# Patient Record
Sex: Female | Born: 1980
Health system: Southern US, Community
[De-identification: ages and names within clinical notes are randomized; demographics above are authoritative.]

## PROBLEM LIST (undated history)

## (undated) DIAGNOSIS — F32A Depression, unspecified: Secondary | ICD-10-CM

## (undated) DIAGNOSIS — F329 Major depressive disorder, single episode, unspecified: Secondary | ICD-10-CM

## (undated) DIAGNOSIS — G8929 Other chronic pain: Secondary | ICD-10-CM

## (undated) DIAGNOSIS — M549 Dorsalgia, unspecified: Secondary | ICD-10-CM

## (undated) DIAGNOSIS — M419 Scoliosis, unspecified: Secondary | ICD-10-CM

## (undated) HISTORY — PX: BACK SURGERY: SHX140

## (undated) HISTORY — PX: OTHER SURGICAL HISTORY: SHX169

## (undated) HISTORY — PX: ENDOMETRIAL ABLATION: SHX621

## (undated) HISTORY — DX: Scoliosis, unspecified: M41.9

---
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
## 1998-12-05 ENCOUNTER — Encounter: Payer: Self-pay | Admitting: Family Medicine

## 1998-12-05 ENCOUNTER — Emergency Department (HOSPITAL_COMMUNITY): Admission: EM | Admit: 1998-12-05 | Discharge: 1998-12-05 | Payer: Self-pay | Admitting: Emergency Medicine

## 1999-02-26 ENCOUNTER — Inpatient Hospital Stay (HOSPITAL_COMMUNITY): Admission: EM | Admit: 1999-02-26 | Discharge: 1999-02-28 | Payer: Self-pay | Admitting: *Deleted

## 1999-08-14 ENCOUNTER — Encounter: Admission: RE | Admit: 1999-08-14 | Discharge: 1999-08-14 | Payer: Self-pay | Admitting: Family Medicine

## 1999-08-14 ENCOUNTER — Other Ambulatory Visit: Admission: RE | Admit: 1999-08-14 | Discharge: 1999-08-14 | Payer: Self-pay | Admitting: Family Medicine

## 2000-02-12 ENCOUNTER — Encounter: Payer: Self-pay | Admitting: *Deleted

## 2000-02-12 ENCOUNTER — Inpatient Hospital Stay (HOSPITAL_COMMUNITY): Admission: AD | Admit: 2000-02-12 | Discharge: 2000-02-12 | Payer: Self-pay | Admitting: *Deleted

## 2000-03-07 ENCOUNTER — Other Ambulatory Visit: Admission: RE | Admit: 2000-03-07 | Discharge: 2000-03-07 | Payer: Self-pay | Admitting: Family Medicine

## 2000-03-24 ENCOUNTER — Other Ambulatory Visit: Admission: RE | Admit: 2000-03-24 | Discharge: 2000-03-24 | Payer: Self-pay | Admitting: Gynecology

## 2000-03-24 ENCOUNTER — Other Ambulatory Visit: Admission: RE | Admit: 2000-03-24 | Discharge: 2000-03-24 | Payer: Self-pay | Admitting: Obstetrics and Gynecology

## 2000-06-03 ENCOUNTER — Inpatient Hospital Stay (HOSPITAL_COMMUNITY): Admission: AD | Admit: 2000-06-03 | Discharge: 2000-06-03 | Payer: Self-pay | Admitting: Obstetrics & Gynecology

## 2000-06-23 ENCOUNTER — Emergency Department (HOSPITAL_COMMUNITY): Admission: EM | Admit: 2000-06-23 | Discharge: 2000-06-23 | Payer: Self-pay | Admitting: Emergency Medicine

## 2000-07-02 ENCOUNTER — Emergency Department (HOSPITAL_COMMUNITY): Admission: EM | Admit: 2000-07-02 | Discharge: 2000-07-03 | Payer: Self-pay | Admitting: *Deleted

## 2000-07-17 ENCOUNTER — Inpatient Hospital Stay (HOSPITAL_COMMUNITY): Admission: AD | Admit: 2000-07-17 | Discharge: 2000-07-17 | Payer: Self-pay | Admitting: Obstetrics and Gynecology

## 2000-08-13 ENCOUNTER — Inpatient Hospital Stay (HOSPITAL_COMMUNITY): Admission: AD | Admit: 2000-08-13 | Discharge: 2000-08-17 | Payer: Self-pay | Admitting: Obstetrics and Gynecology

## 2001-03-12 ENCOUNTER — Inpatient Hospital Stay (HOSPITAL_COMMUNITY): Admission: AD | Admit: 2001-03-12 | Discharge: 2001-03-12 | Payer: Self-pay | Admitting: Obstetrics and Gynecology

## 2001-04-14 ENCOUNTER — Encounter: Payer: Self-pay | Admitting: *Deleted

## 2001-04-14 ENCOUNTER — Inpatient Hospital Stay (HOSPITAL_COMMUNITY): Admission: AD | Admit: 2001-04-14 | Discharge: 2001-04-14 | Payer: Self-pay | Admitting: *Deleted

## 2001-04-23 ENCOUNTER — Inpatient Hospital Stay (HOSPITAL_COMMUNITY): Admission: AD | Admit: 2001-04-23 | Discharge: 2001-04-23 | Payer: Self-pay | Admitting: *Deleted

## 2001-12-06 ENCOUNTER — Emergency Department (HOSPITAL_COMMUNITY): Admission: EM | Admit: 2001-12-06 | Discharge: 2001-12-07 | Payer: Self-pay | Admitting: Emergency Medicine

## 2001-12-07 ENCOUNTER — Encounter: Payer: Self-pay | Admitting: Emergency Medicine

## 2002-08-24 ENCOUNTER — Other Ambulatory Visit: Admission: RE | Admit: 2002-08-24 | Discharge: 2002-08-24 | Payer: Self-pay | Admitting: *Deleted

## 2002-10-19 ENCOUNTER — Emergency Department (HOSPITAL_COMMUNITY): Admission: EM | Admit: 2002-10-19 | Discharge: 2002-10-19 | Payer: Self-pay | Admitting: Emergency Medicine

## 2002-10-19 ENCOUNTER — Encounter: Payer: Self-pay | Admitting: Emergency Medicine

## 2003-05-10 ENCOUNTER — Inpatient Hospital Stay (HOSPITAL_COMMUNITY): Admission: AD | Admit: 2003-05-10 | Discharge: 2003-05-10 | Payer: Self-pay | Admitting: *Deleted

## 2003-05-10 IMAGING — US US OB COMP LESS 14 WK
1 series · 14 of 20 positions shown · non-contrast
Comparison: none

CLINICAL DATA: pregnant; bleeding
 EARLY OBSTETRICAL ULTRASOUND

[Series 1: unknown · 0.32mm/px · 14 of 20 slices shown]
[im 1/20]
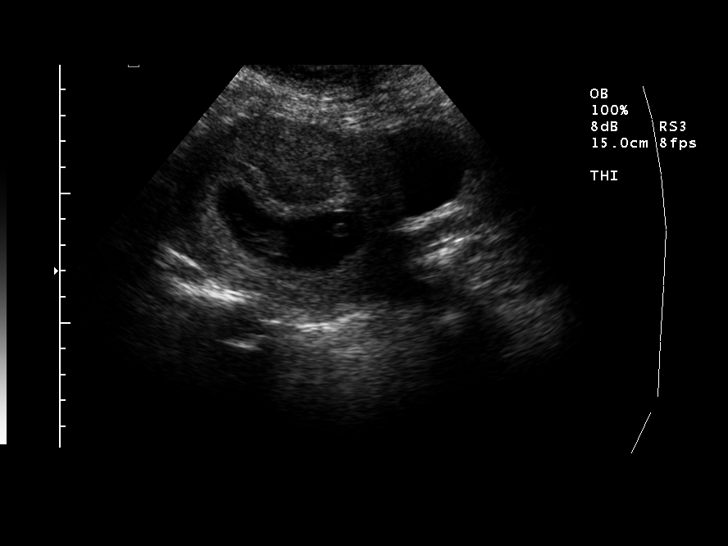
[im 3/20]
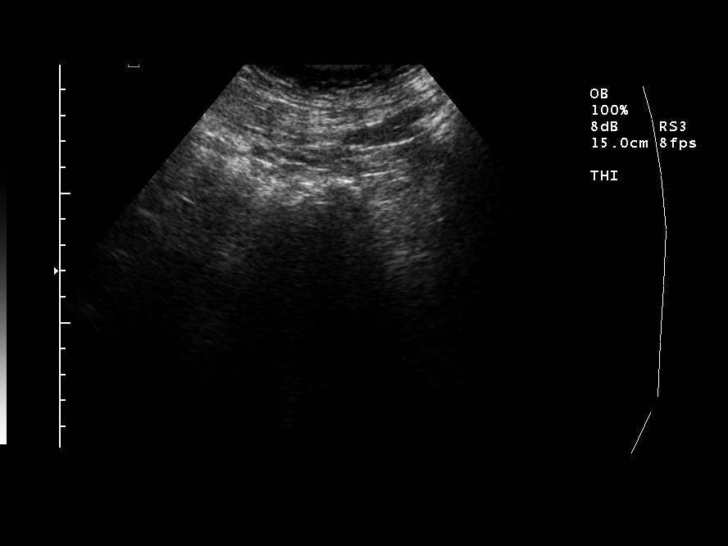
[im 4/20]
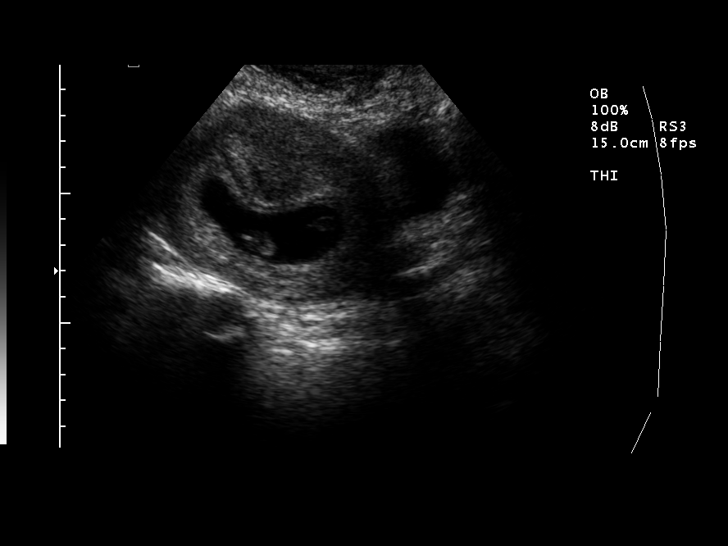
[im 6/20]
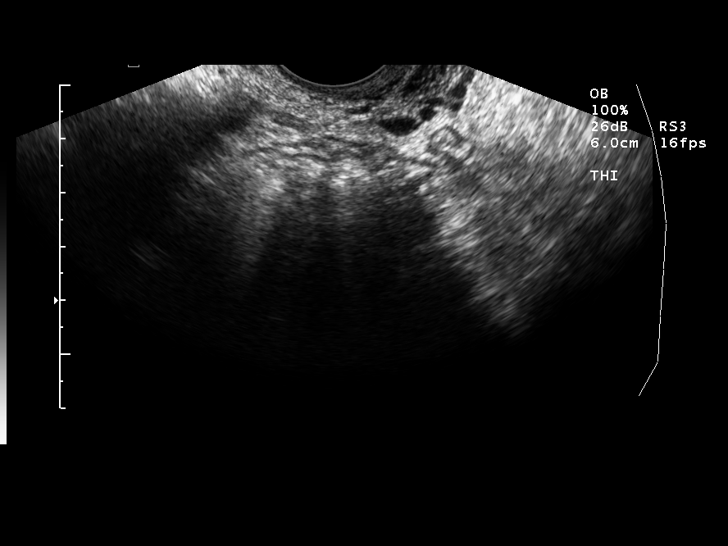
[im 7/20]
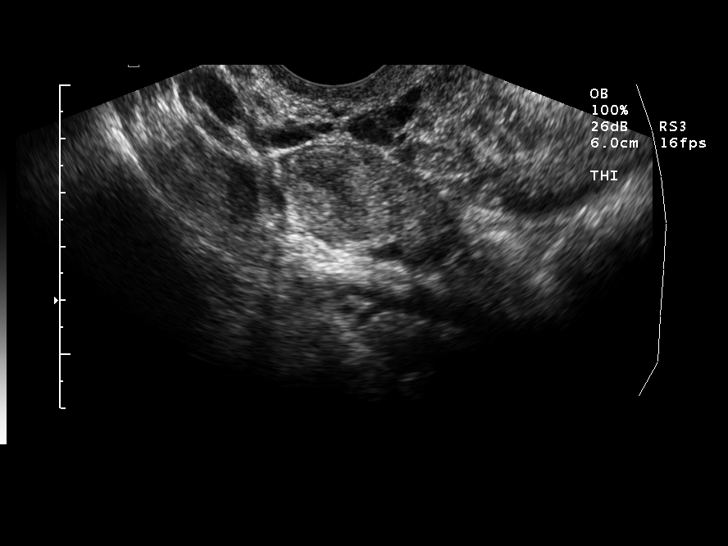
[im 8/20]
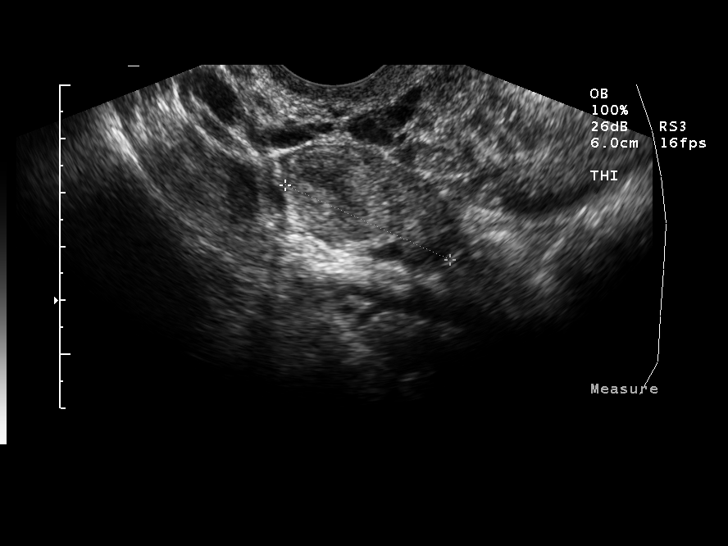
[im 10/20]
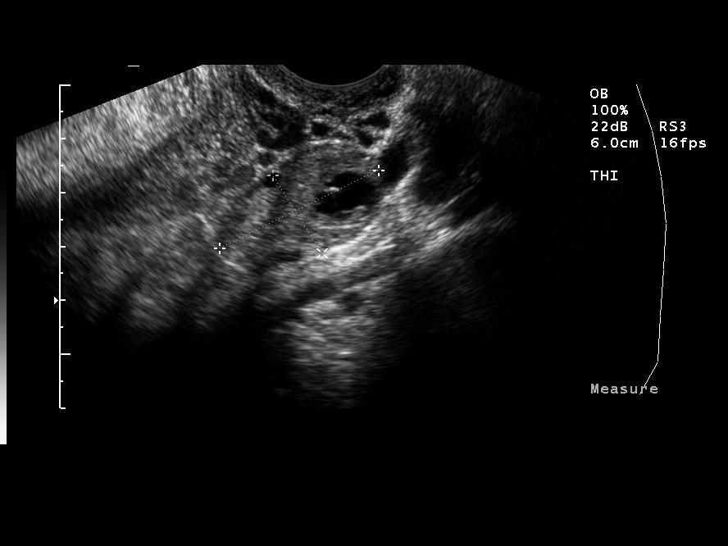
[im 11/20]
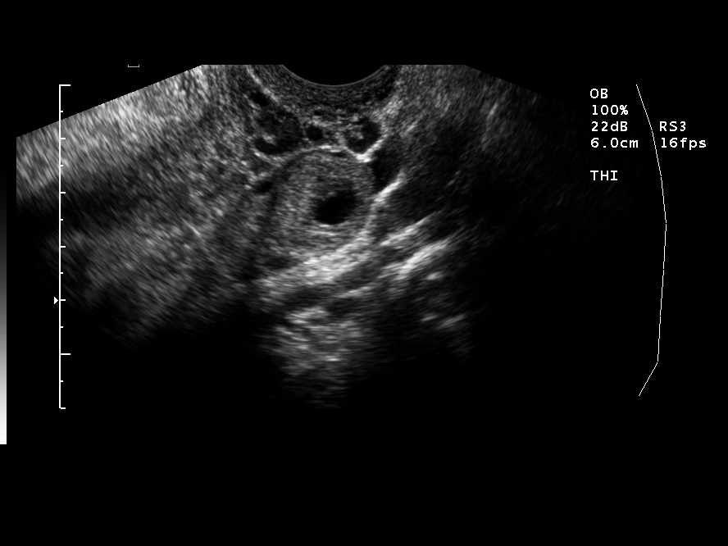
[im 13/20]
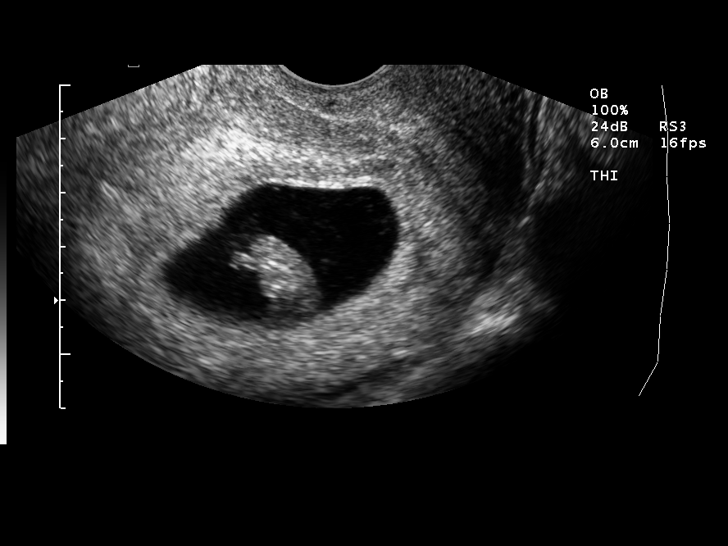
[im 14/20]
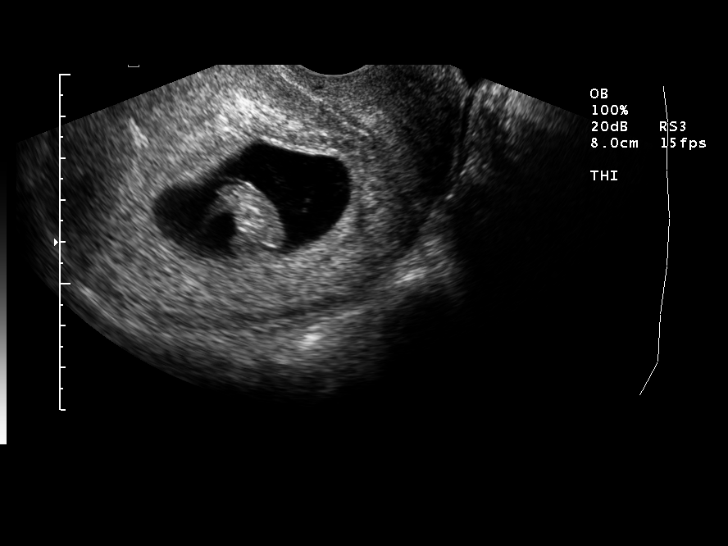
[im 16/20]
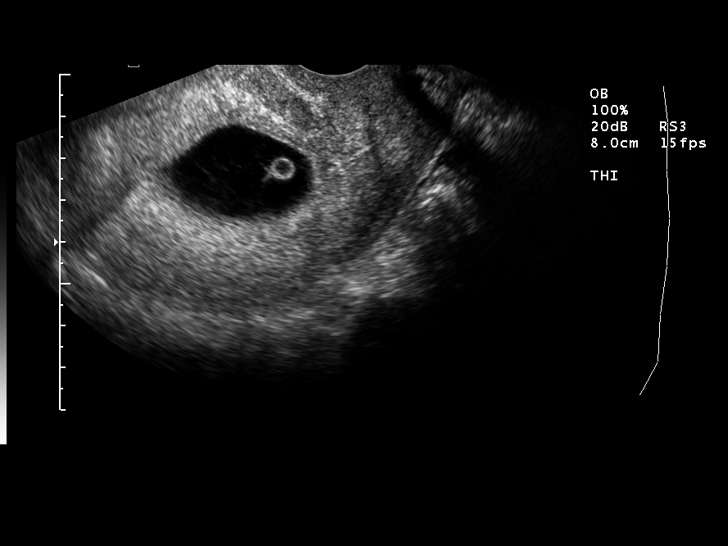
[im 17/20]
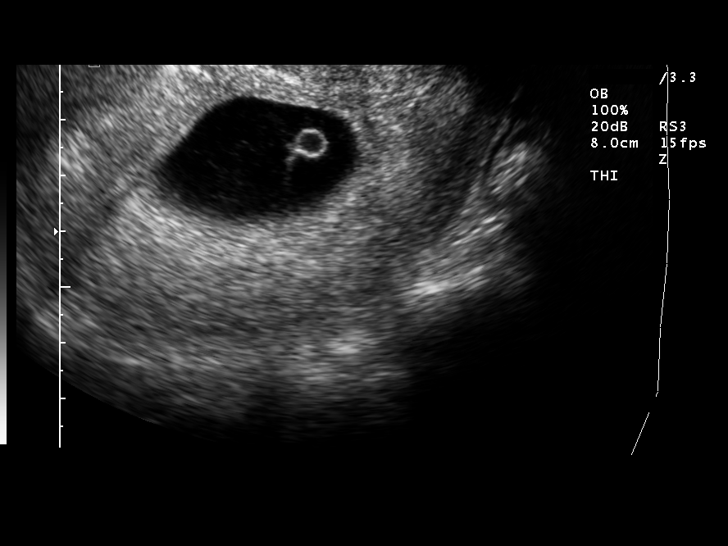
[im 18/20]
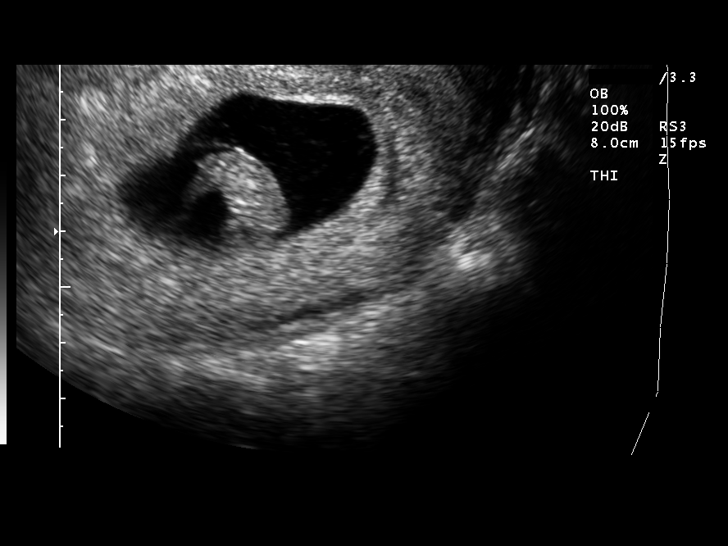
[im 20/20]
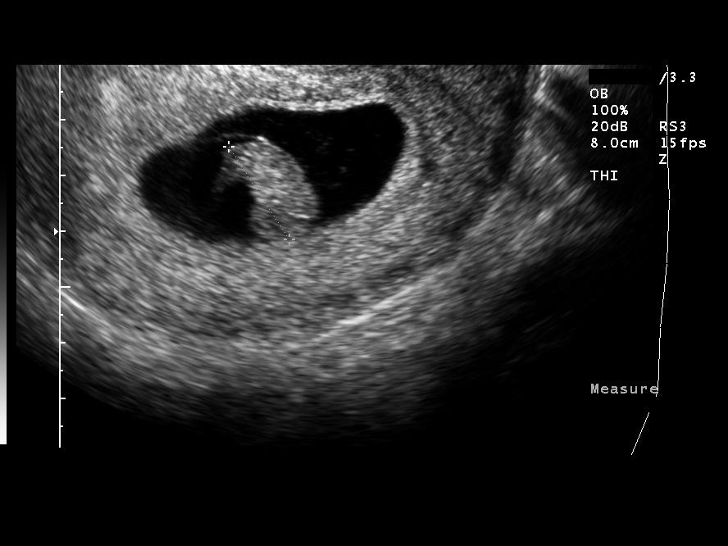

[14 of 20 positions shown; findings below may reference images not displayed]

FINDINGS: Single live intrauterine gestation with a crown rump length of 2cm (8 weeks, 4 days).  Heart rate evident at 183 beats per minute.   Amniotic fluid appears subjectively normal.  Right ovary is unremarkable.  Left ovary is not visualized.  No free fluid is seen.  
 IMPRESSION
 Single live intrauterine gestation 8 weeks, 4 days without apparent complication.

## 2003-05-18 ENCOUNTER — Other Ambulatory Visit: Admission: RE | Admit: 2003-05-18 | Discharge: 2003-05-18 | Payer: Self-pay | Admitting: Obstetrics and Gynecology

## 2003-09-26 ENCOUNTER — Inpatient Hospital Stay (HOSPITAL_COMMUNITY): Admission: AD | Admit: 2003-09-26 | Discharge: 2003-09-26 | Payer: Self-pay | Admitting: Obstetrics & Gynecology

## 2003-11-18 ENCOUNTER — Inpatient Hospital Stay (HOSPITAL_COMMUNITY): Admission: AD | Admit: 2003-11-18 | Discharge: 2003-11-19 | Payer: Self-pay | Admitting: Obstetrics and Gynecology

## 2003-11-20 ENCOUNTER — Inpatient Hospital Stay (HOSPITAL_COMMUNITY): Admission: AD | Admit: 2003-11-20 | Discharge: 2003-11-20 | Payer: Self-pay | Admitting: Obstetrics and Gynecology

## 2003-12-06 ENCOUNTER — Inpatient Hospital Stay (HOSPITAL_COMMUNITY): Admission: RE | Admit: 2003-12-06 | Discharge: 2003-12-10 | Payer: Self-pay | Admitting: Obstetrics and Gynecology

## 2003-12-06 ENCOUNTER — Encounter (INDEPENDENT_AMBULATORY_CARE_PROVIDER_SITE_OTHER): Payer: Self-pay | Admitting: Specialist

## 2004-07-19 ENCOUNTER — Other Ambulatory Visit: Admission: RE | Admit: 2004-07-19 | Discharge: 2004-07-19 | Payer: Self-pay | Admitting: Obstetrics and Gynecology

## 2004-08-29 ENCOUNTER — Emergency Department (HOSPITAL_COMMUNITY): Admission: EM | Admit: 2004-08-29 | Discharge: 2004-08-29 | Payer: Self-pay | Admitting: Emergency Medicine

## 2004-08-29 IMAGING — CR DG CERVICAL SPINE COMPLETE 4+V
6 series · 6 of 6 positions shown · non-contrast
Comparison: none

CLINICAL DATA: Neck pain for two days.
 CERVICAL SPINE SERIES ? 6 VIEWS:
 Loss of the normal lordotic curvature with mild kyphosis.  Negative for fracture, diastasis, or subluxation.

[w c-spine lat]
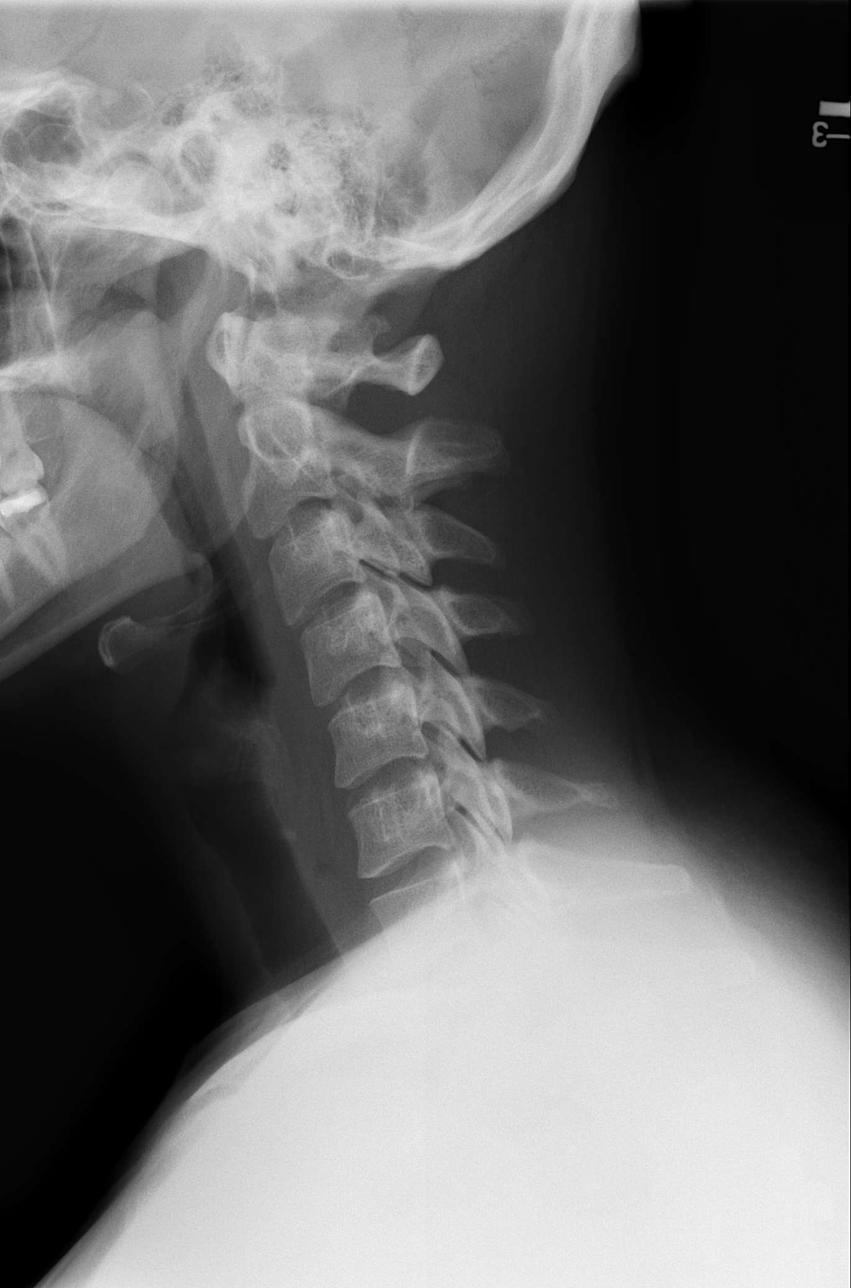

[w c-spine oblique (1 of 2)]
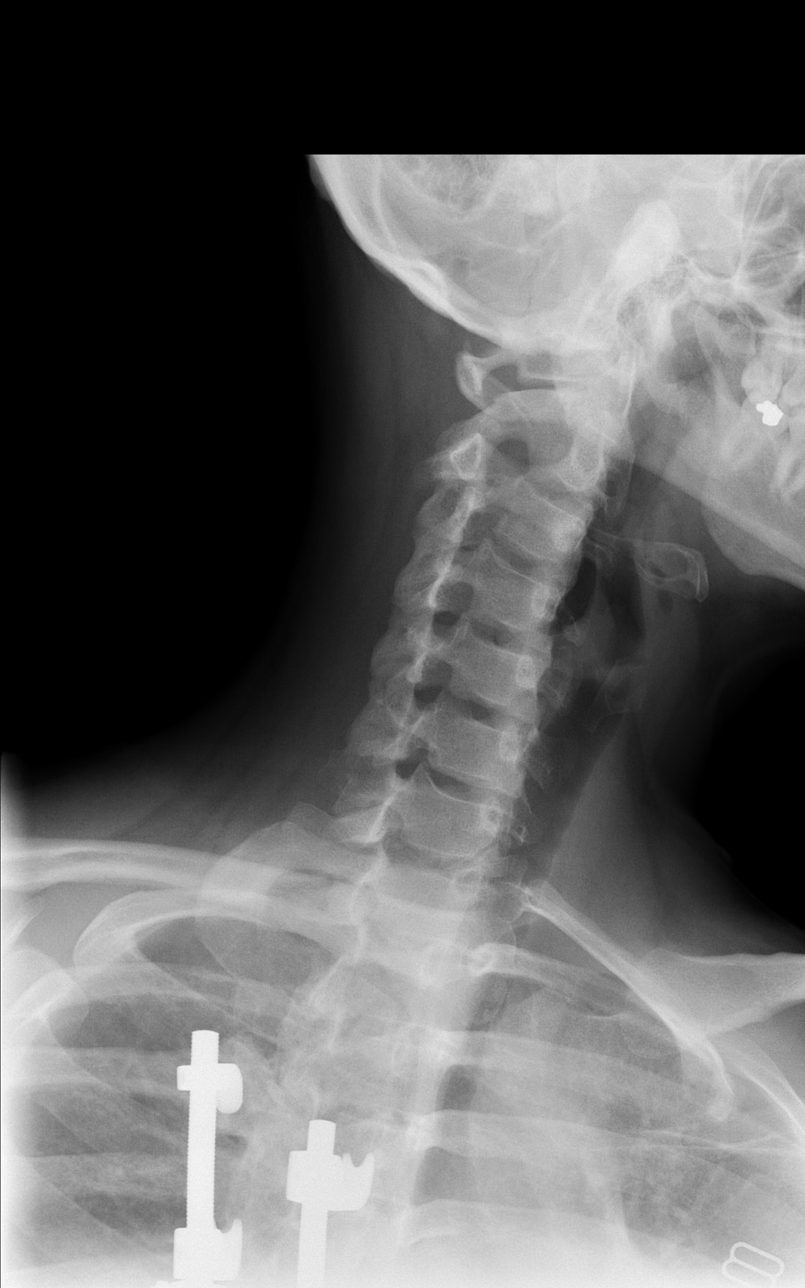

[w c-spine oblique (2 of 2)]
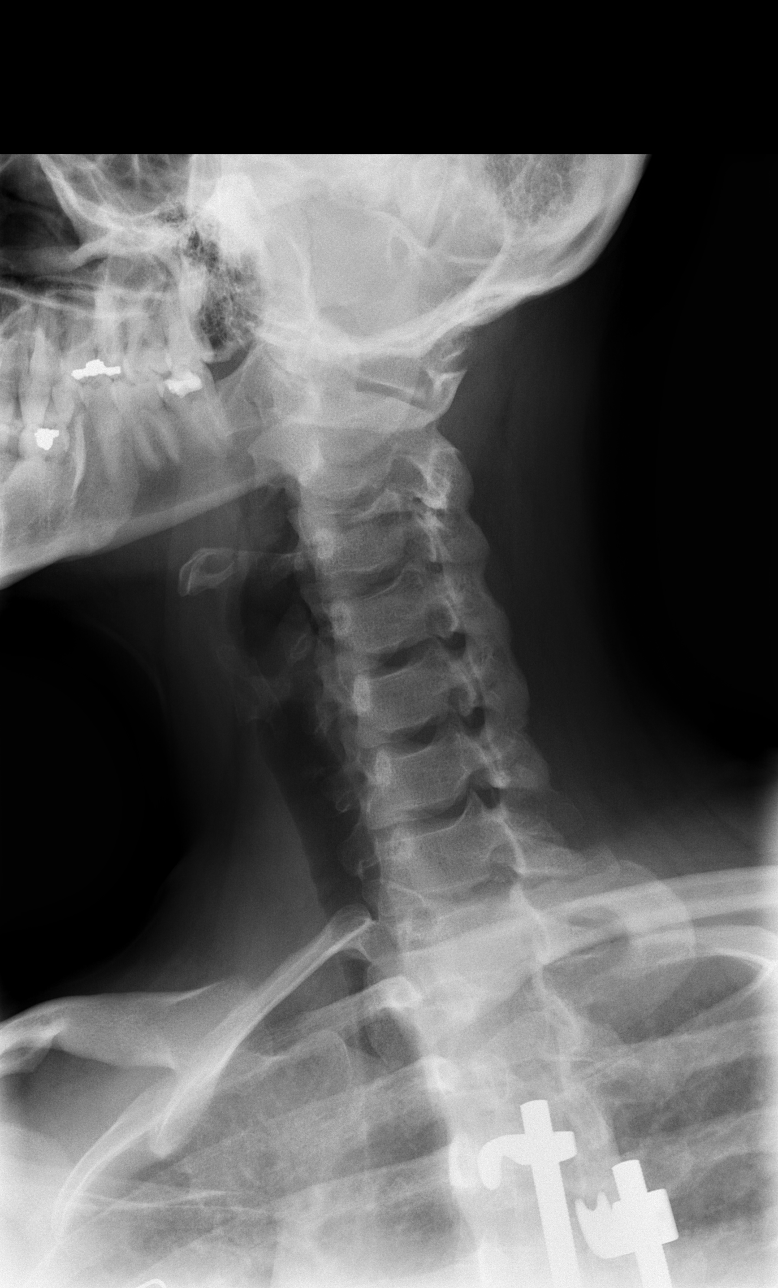

[w c-spine a.p. *]
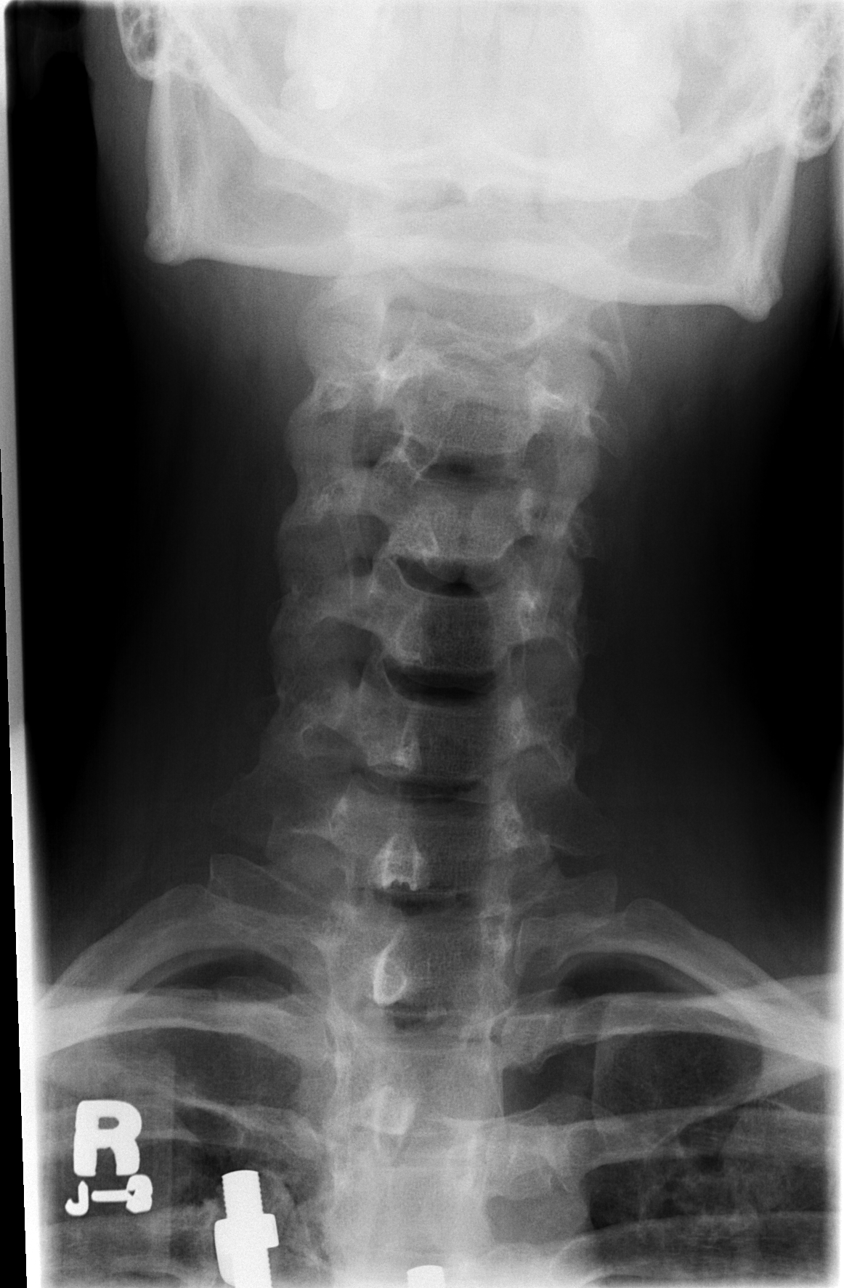

[w c-spine odontoid * (1 of 2)]
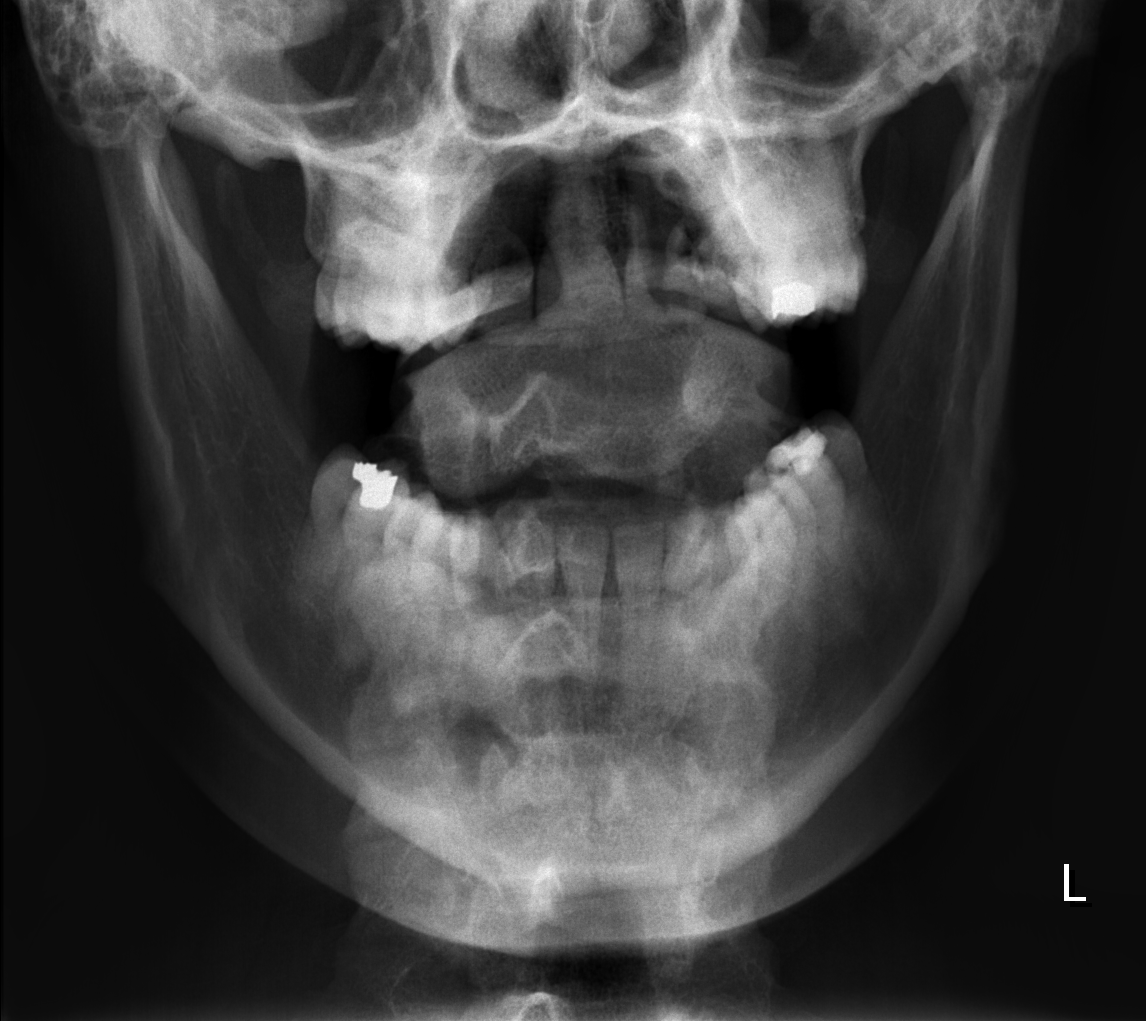

[w c-spine odontoid * (2 of 2)]
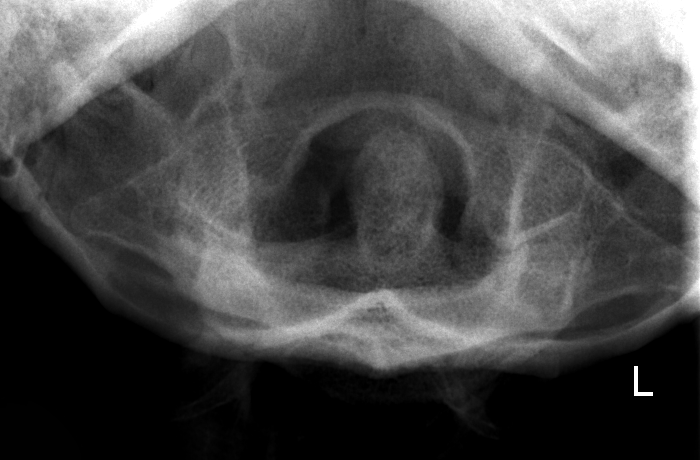

[6 of 6 positions shown; findings below may reference images not displayed]

IMPRESSION: No acute cervical spine fracture or subluxation.

## 2005-06-25 ENCOUNTER — Ambulatory Visit: Payer: Self-pay | Admitting: Family Medicine

## 2006-01-02 ENCOUNTER — Inpatient Hospital Stay (HOSPITAL_COMMUNITY): Admission: AD | Admit: 2006-01-02 | Discharge: 2006-01-02 | Payer: Self-pay | Admitting: Obstetrics and Gynecology

## 2006-07-19 ENCOUNTER — Emergency Department (HOSPITAL_COMMUNITY): Admission: EM | Admit: 2006-07-19 | Discharge: 2006-07-19 | Payer: Self-pay | Admitting: Emergency Medicine

## 2006-07-19 IMAGING — CR DG HAND COMPLETE 3+V*R*
3 series · 3 of 3 positions shown · non-contrast
Comparison: none

CLINICAL DATA: Hand pain

RIGHT HAND - 3  VIEW:

[x hand pa right]
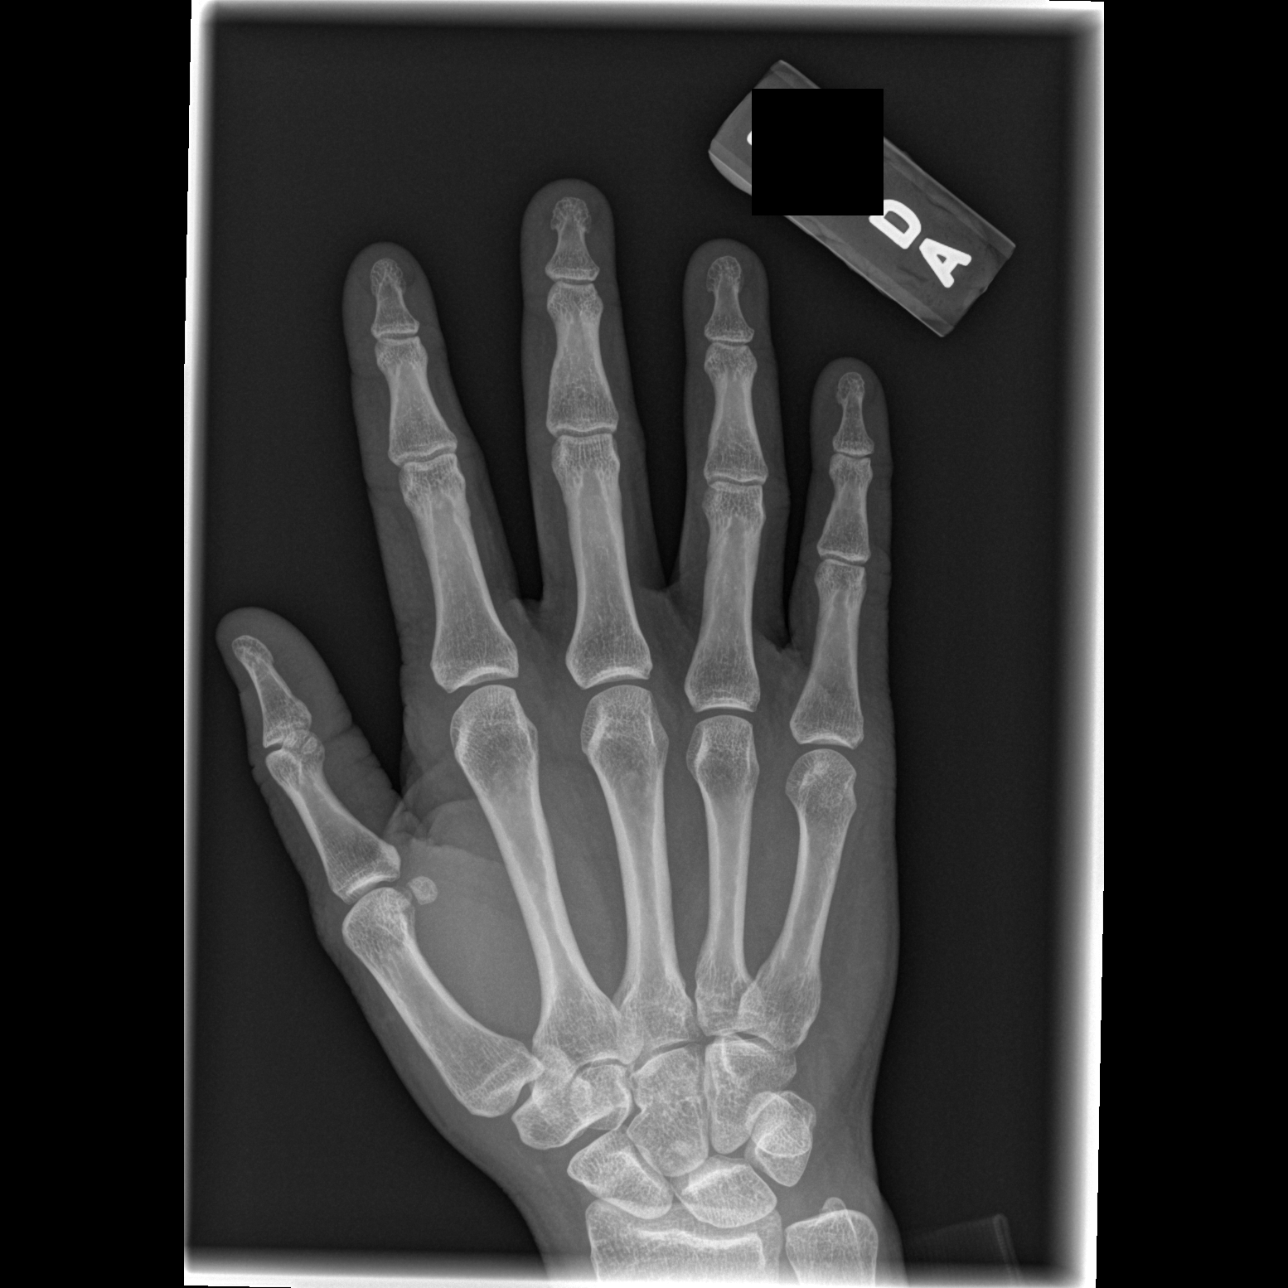

[x hand oblique right]
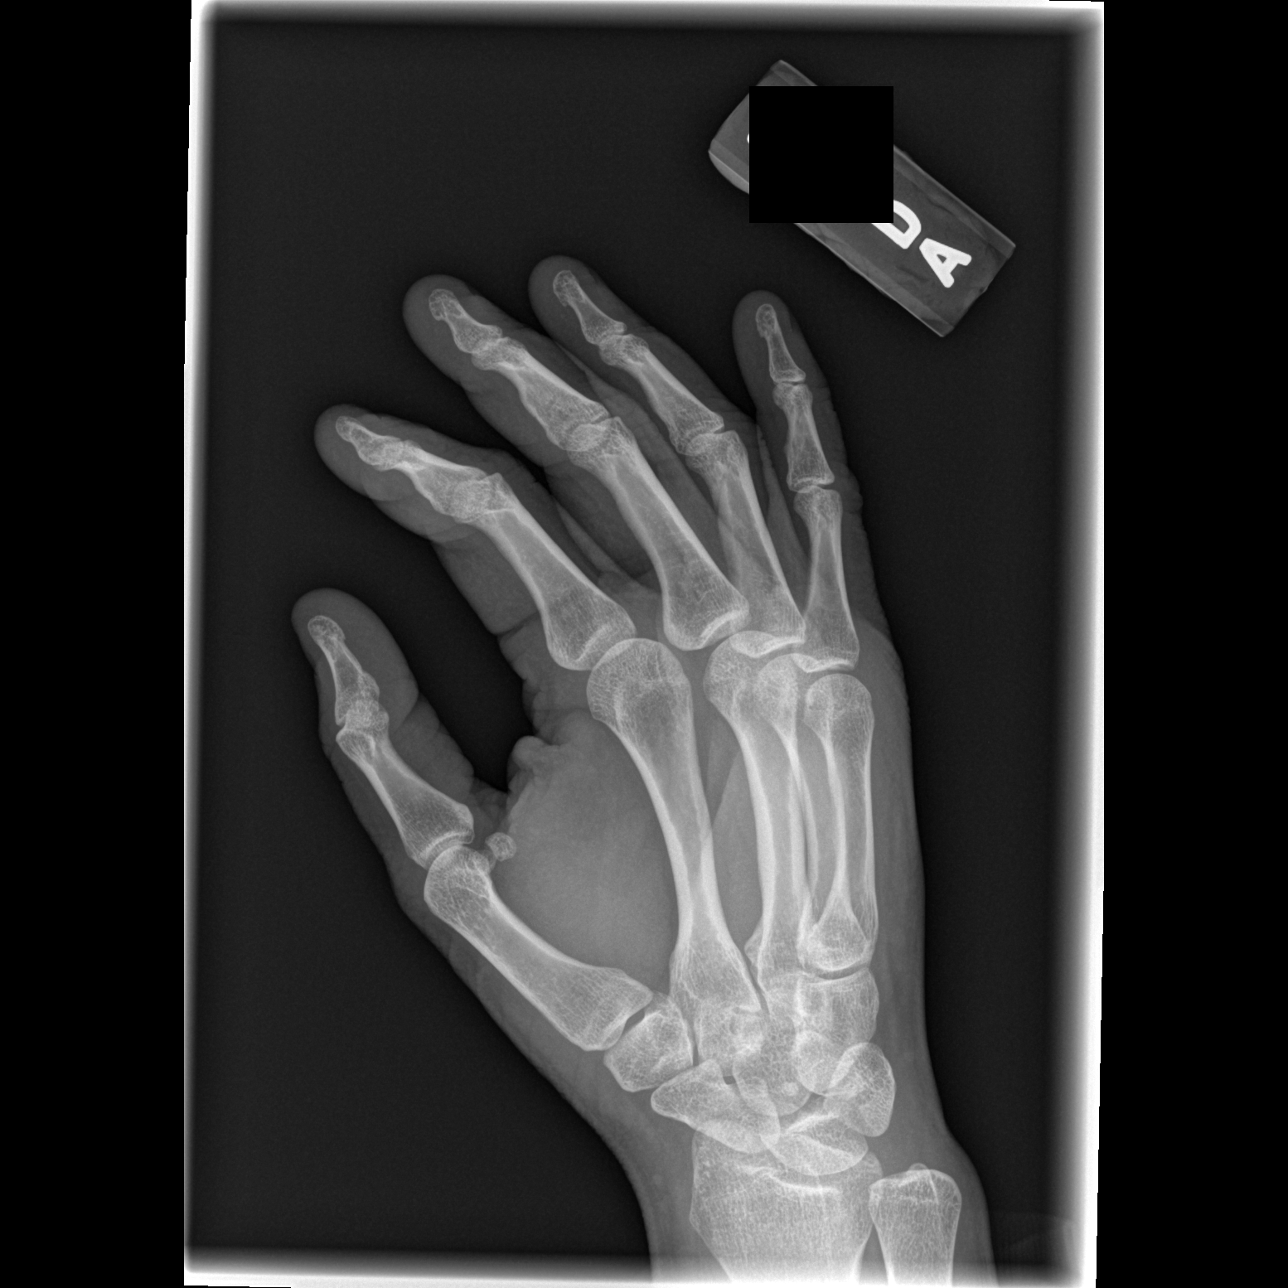

[x hand lat right]
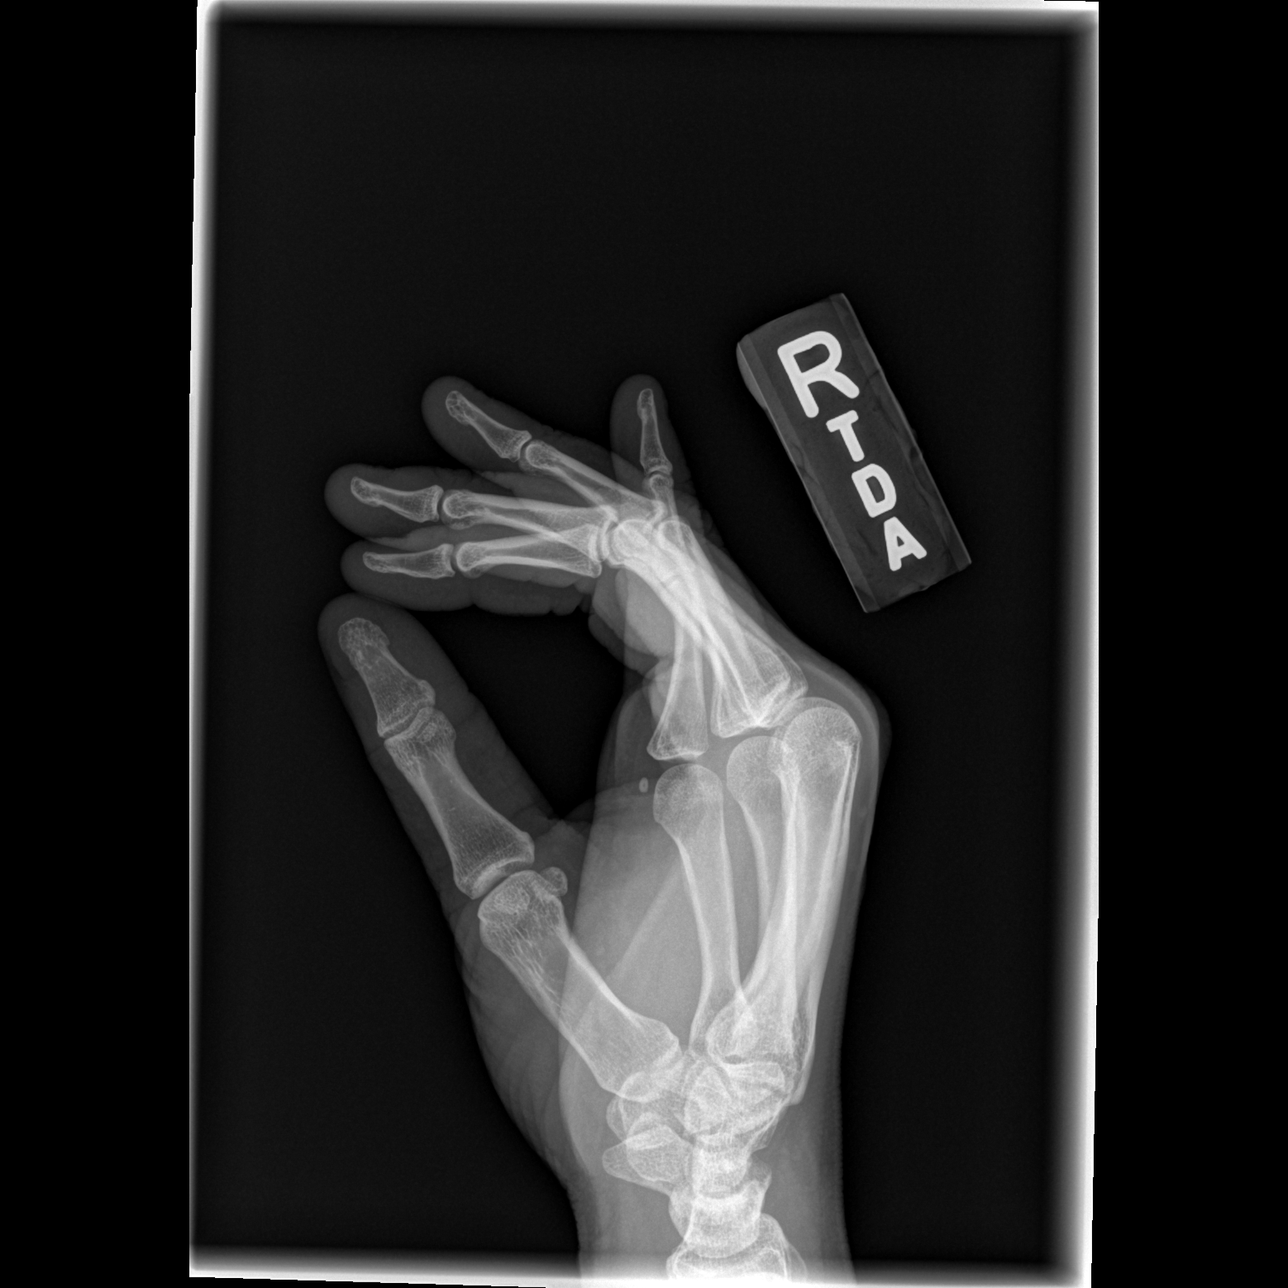

[3 of 3 positions shown; findings below may reference images not displayed]

FINDINGS: There is no evidence of fracture or dislocation.  There is no
evidence of arthropathy or other focal bone abnormality.  Soft tissues are
unremarkable.
IMPRESSION: Negative.

## 2006-12-12 ENCOUNTER — Emergency Department (HOSPITAL_COMMUNITY): Admission: EM | Admit: 2006-12-12 | Discharge: 2006-12-12 | Payer: Self-pay | Admitting: Emergency Medicine

## 2007-04-17 ENCOUNTER — Emergency Department (HOSPITAL_COMMUNITY): Admission: EM | Admit: 2007-04-17 | Discharge: 2007-04-17 | Payer: Self-pay | Admitting: Emergency Medicine

## 2007-04-17 IMAGING — CT CT ABDOMEN W/O CM
2 of 4 series · 17 of 46 positions shown, 19 images · IV contrast (agent unspecified)
Comparison: None available.

CLINICAL DATA: Left flank pain, dark spotting.  
 ABDOMEN CT WITHOUT CONTRAST:
TECHNIQUE: Multidetector CT imaging of the abdomen was performed following the standard protocol without IV contrast.
TECHNIQUE: Multidetector CT imaging of the pelvis was performed following the standard protocol without IV contrast.

[Series 2: stone <(id) w/o a & p (id) · axial · non-contrast · 0.70mm/px · z∈[-439,-69]mm · 14 of 82 slices shown, 16 images]
[im 4/82  soft-tissue]
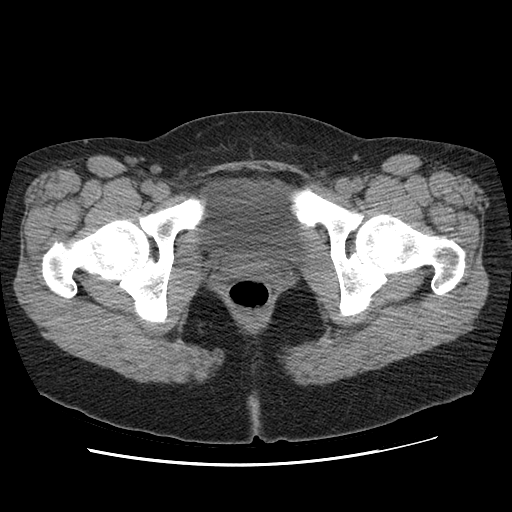
[im 4/82  bone]
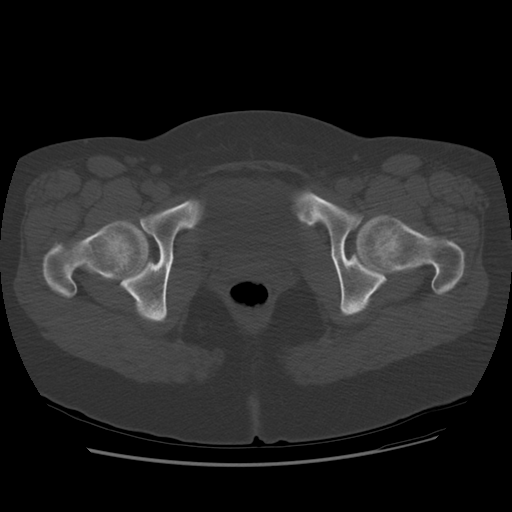
[im 10/82  soft-tissue]
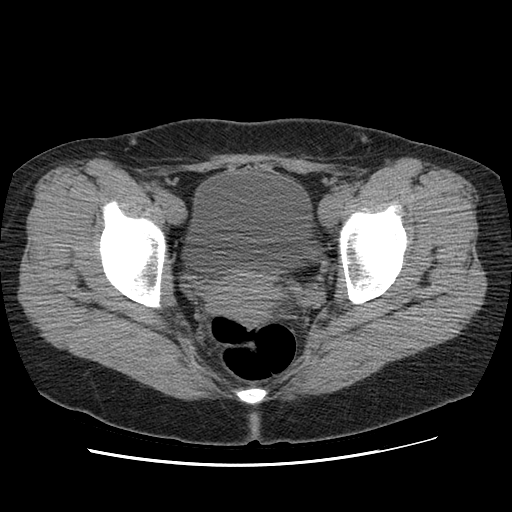
[im 17/82  soft-tissue]
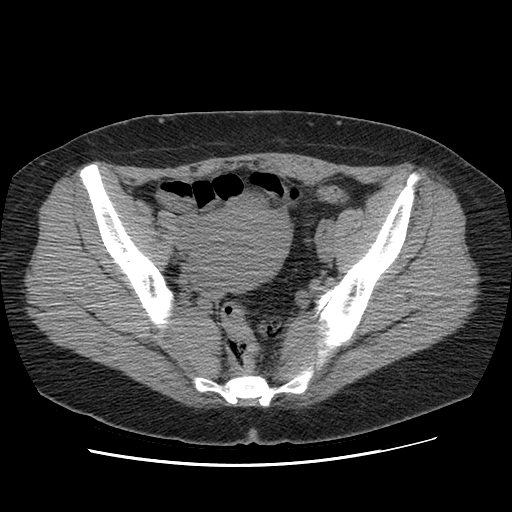
[im 23/82  soft-tissue]
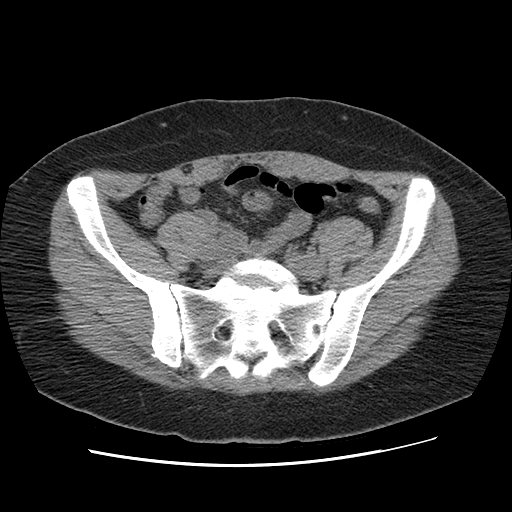
[im 26/82  soft-tissue]
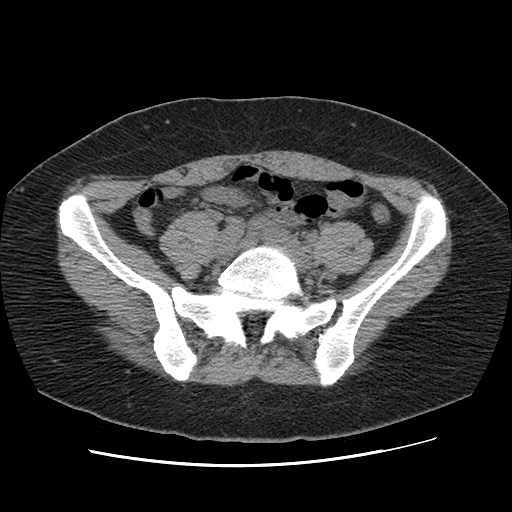
[im 33/82  soft-tissue]
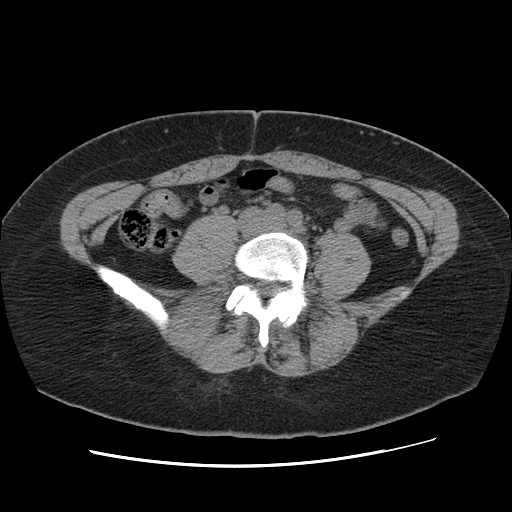
[im 39/82  soft-tissue]
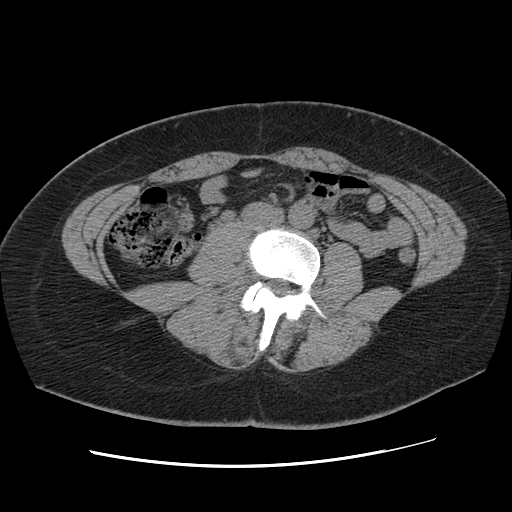
[im 43/82  soft-tissue]
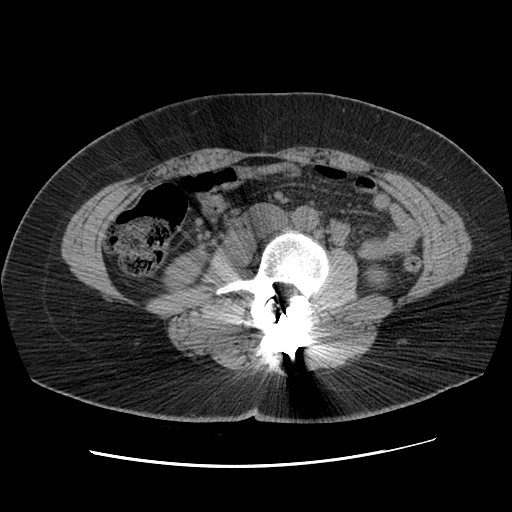
[im 49/82  soft-tissue]
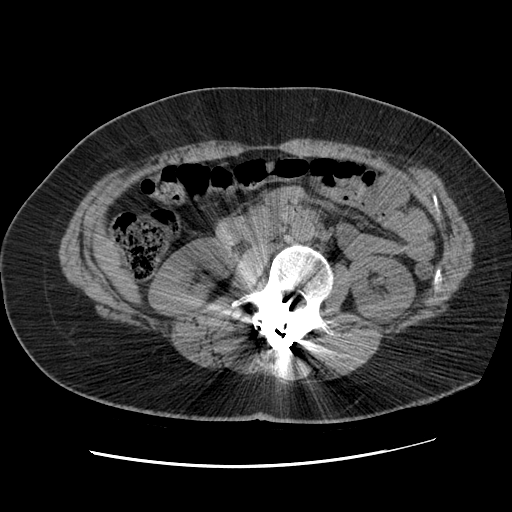
[im 49/82  bone]
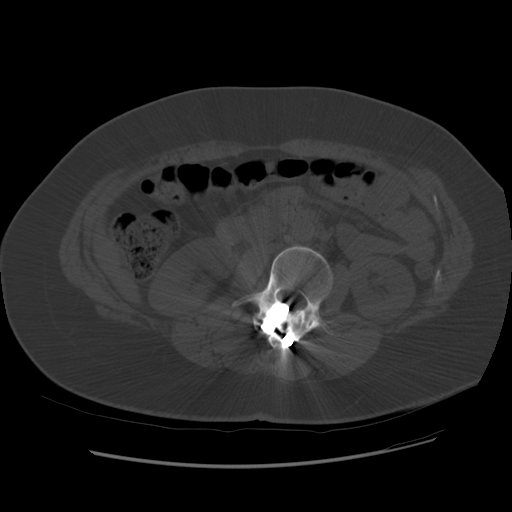
[im 56/82  soft-tissue]
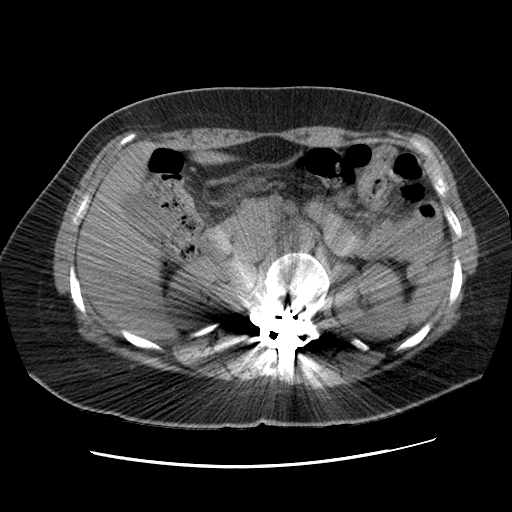
[im 62/82  soft-tissue]
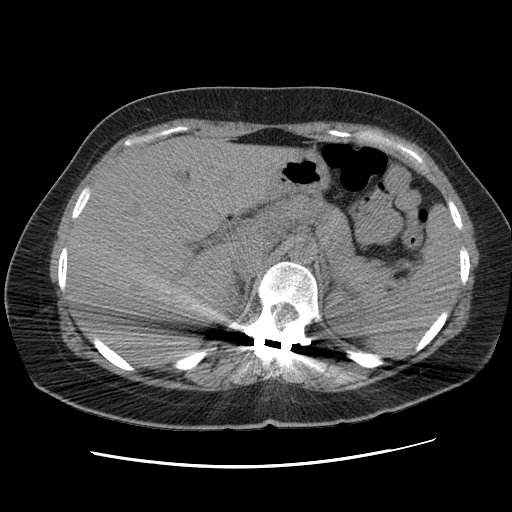
[im 65/82  soft-tissue]
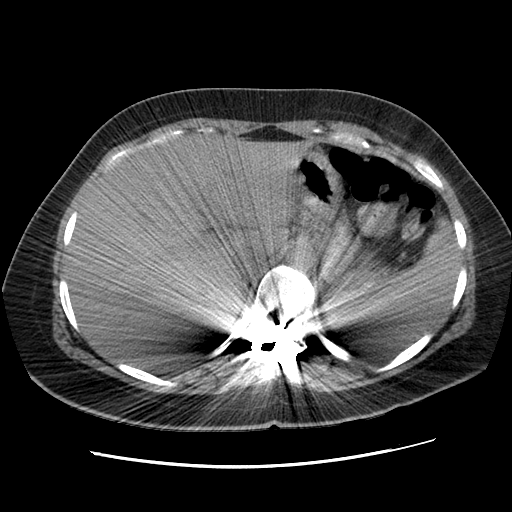
[im 72/82  soft-tissue]
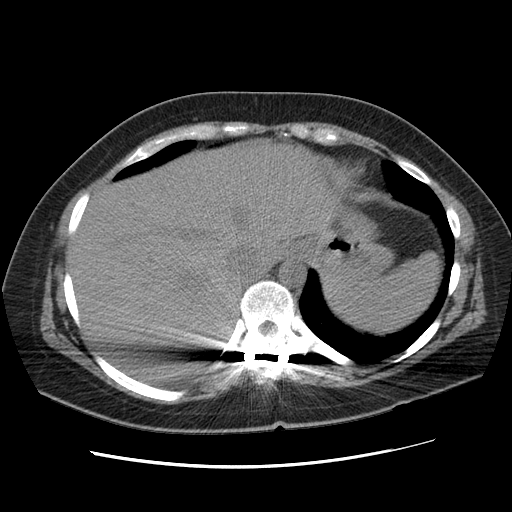
[im 78/82  soft-tissue]
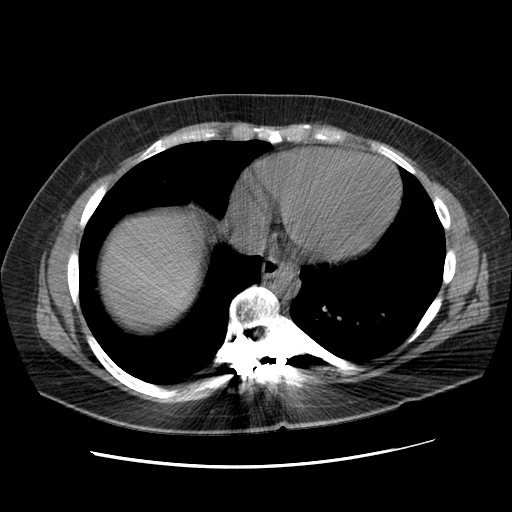

[Series 401: cor a/p · coronal · 0.90mm/px · 3 of 112 slices shown]
[im 38/112  soft-tissue]
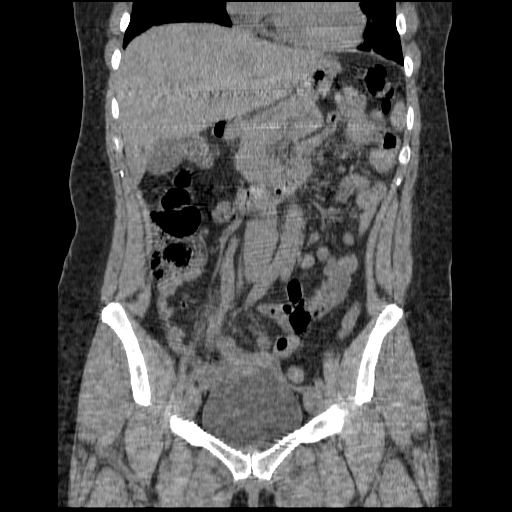
[im 50/112  soft-tissue]
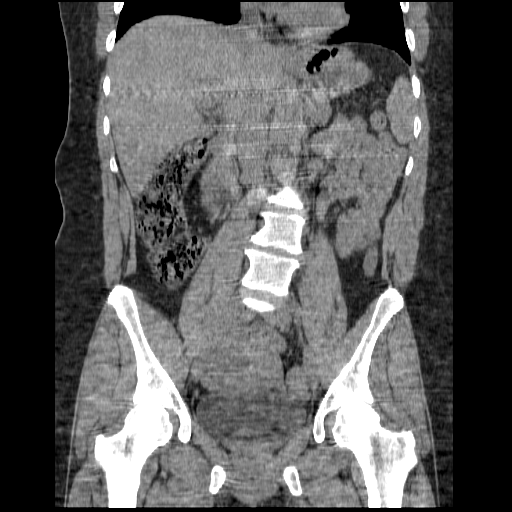
[im 62/112  soft-tissue]
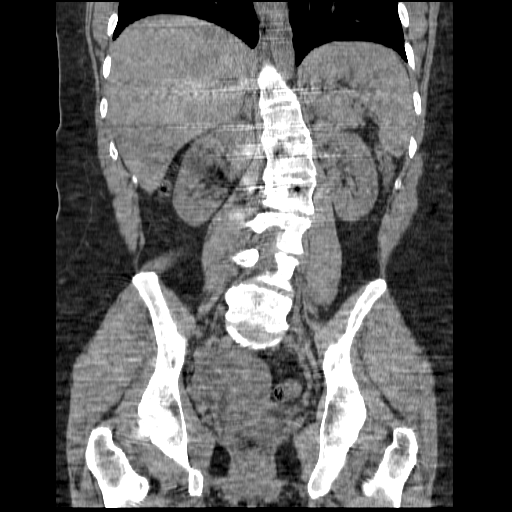

[17 of 46 positions shown; findings below may reference images not displayed]

FINDINGS: Minimal dependent atelectasis in the lung bases.  Harrington rods in the thoracolumbar spine.  No hardware disconnection or complication identified.  Spinal rods do project artifact over the upper abdomen, mildly limiting the examination.  
 The noncontrast infused appearance of the liver and spleen as well adrenals and kidneys is within normal limits.  Stomach and proximal small bowel normal.  Abdominal vasculature unremarkable.  Ureters appear within normal limits extending to the urinary bladder.  Gallbladder unremarkable.  Pancreas unremarkable.  No free air.
IMPRESSION: 1.  Mildly limited exam of the exam due to artifact from Harrington rods.  
 2.  No acute abdominal abnormalities.  
 PELVIS CT WITHOUT CONTRAST:
FINDINGS: The urinary bladder, uterus, and adnexa appear within normal limits.  Tiny amount of free fluid is present in the anatomic pelvis.  Normal appendix identified.  Colon appears within normal limits extending to the rectum.  Bone windows show no aggressive osteolytic or osteoblastic lesions.  Persistent thoracolumbar scoliosis is present which is levo convex at the thoracolumbar junction.
IMPRESSION: No acute pelvic abnormalities.

## 2007-04-19 ENCOUNTER — Emergency Department (HOSPITAL_COMMUNITY): Admission: EM | Admit: 2007-04-19 | Discharge: 2007-04-20 | Payer: Self-pay | Admitting: Emergency Medicine

## 2007-04-25 ENCOUNTER — Emergency Department (HOSPITAL_COMMUNITY): Admission: EM | Admit: 2007-04-25 | Discharge: 2007-04-25 | Payer: Self-pay | Admitting: Emergency Medicine

## 2007-04-25 ENCOUNTER — Encounter: Payer: Self-pay | Admitting: Obstetrics and Gynecology

## 2007-04-25 IMAGING — US US PELVIS COMPLETE MODIFY
1 series · 14 of 25 positions shown · non-contrast
Comparison: none

CLINICAL DATA: Back pain.
 TRANSABDOMINAL AND TRANSVAGINAL PELVIC ULTRASOUND:
TECHNIQUE: Both transabdominal and transvaginal ultrasound examinations of the pelvis were performed including evaluation of the uterus, ovaries, adnexal regions, and pelvic cul-de-sac.

[Series 1: us pelvis complete modify · 0.24mm/px · 14 of 41 slices shown]
[im 1/41]
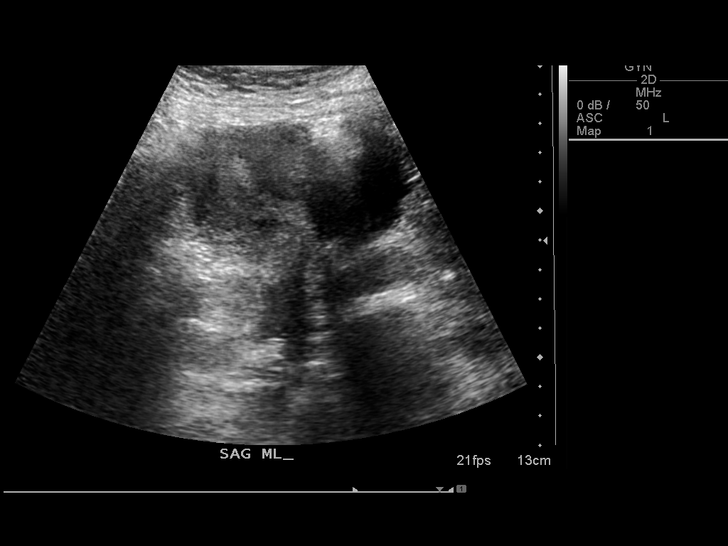
[im 4/41]
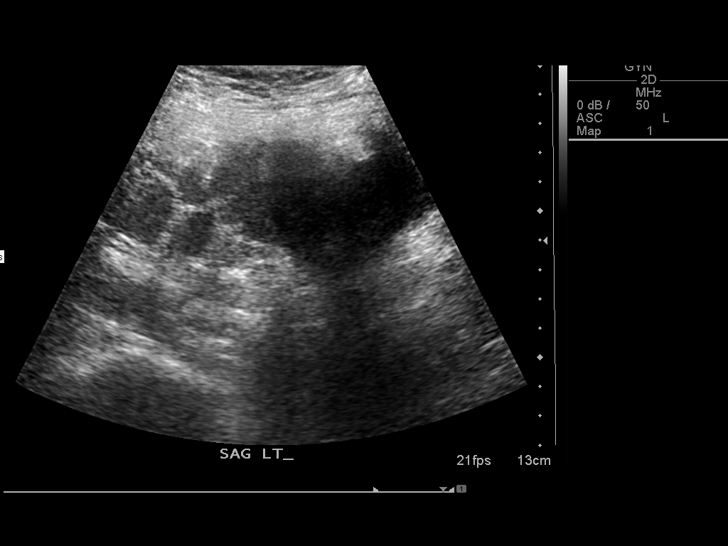
[im 7/41]
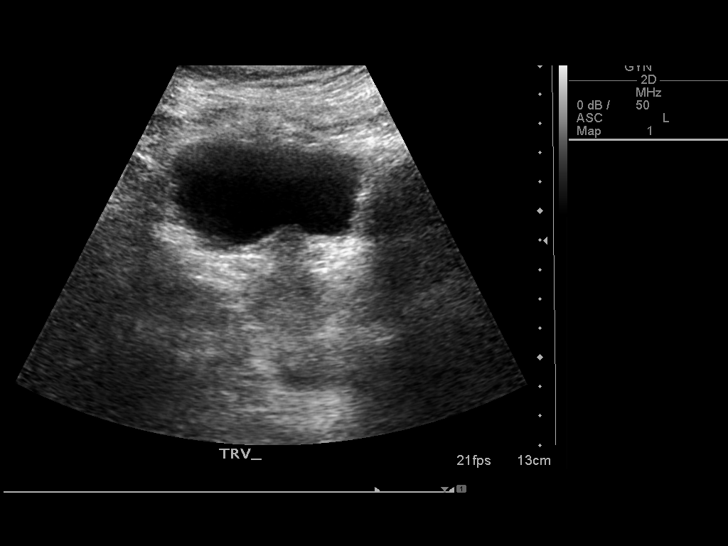
[im 11/41]
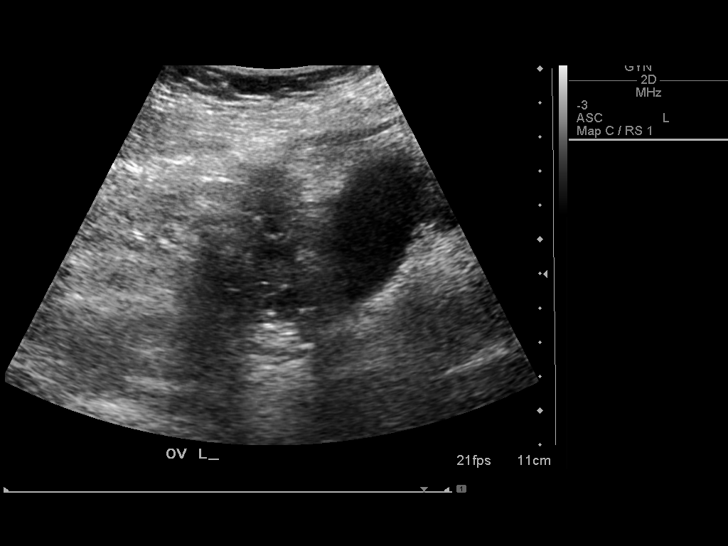
[im 14/41]
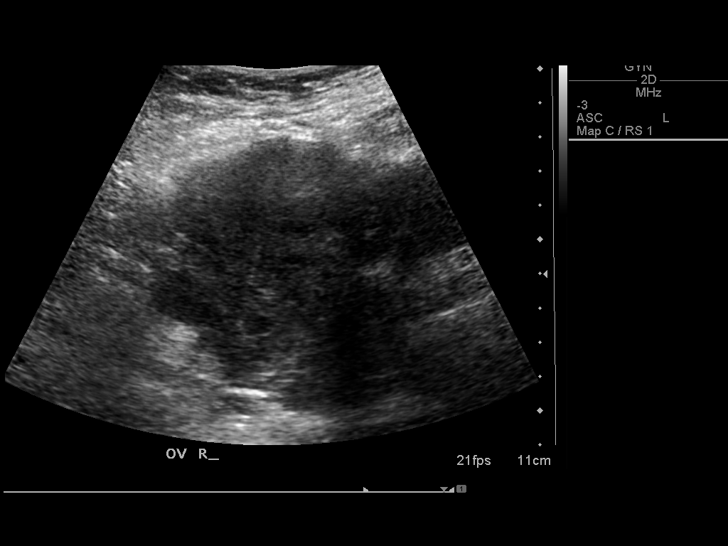
[im 16/41]
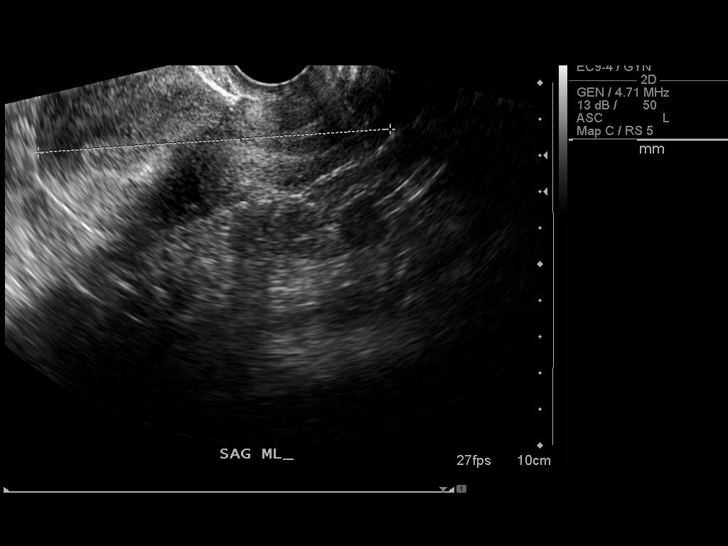
[im 19/41]
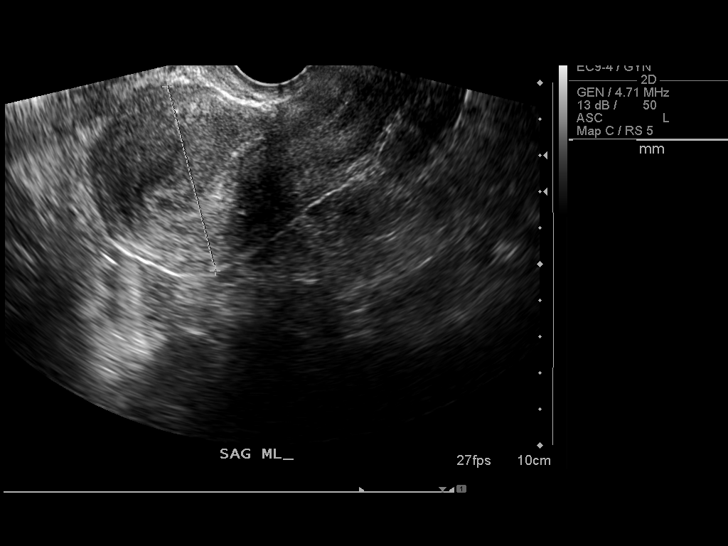
[im 22/41]
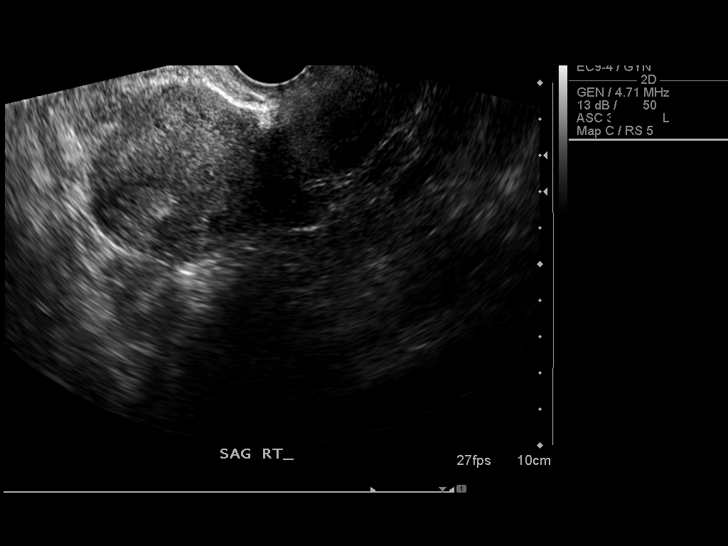
[im 26/41]
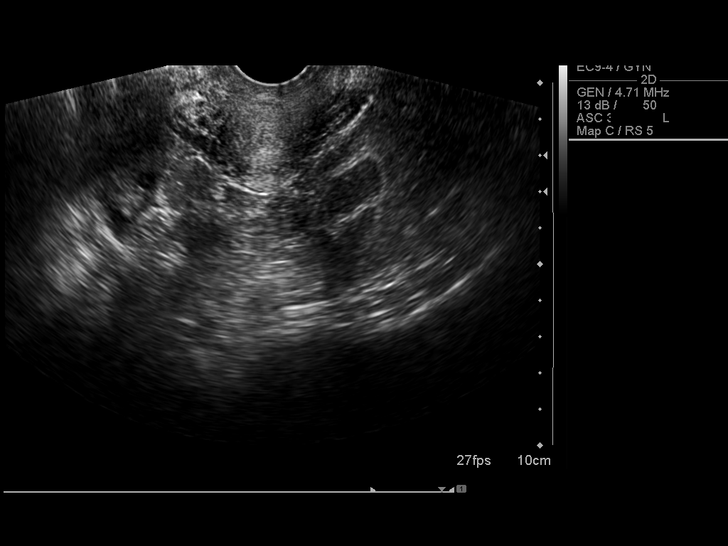
[im 27/41]
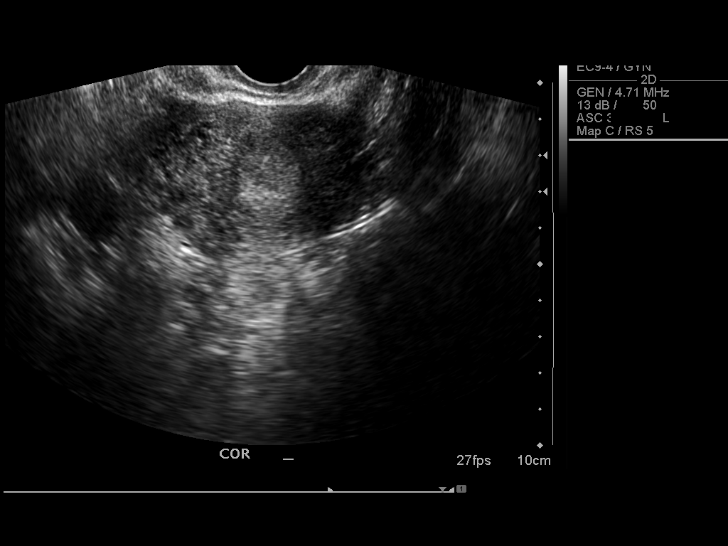
[im 31/41]
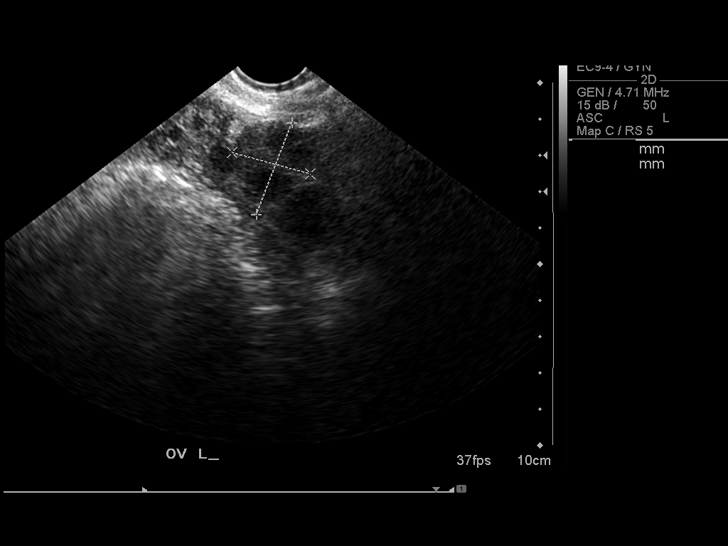
[im 34/41]
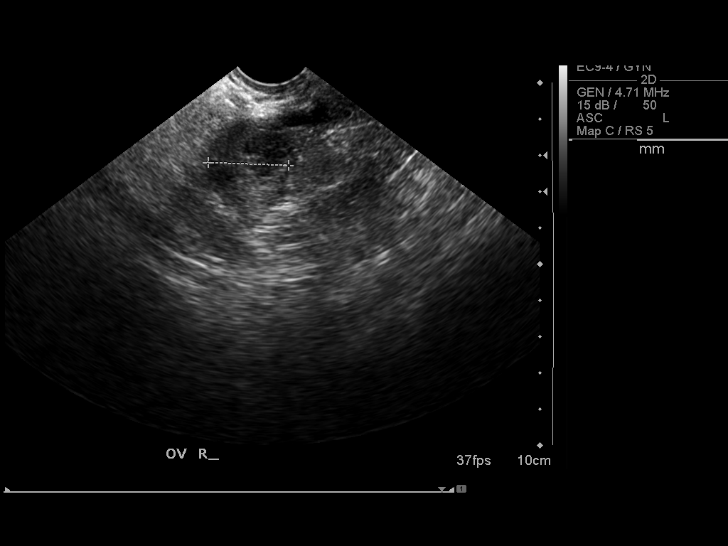
[im 37/41]
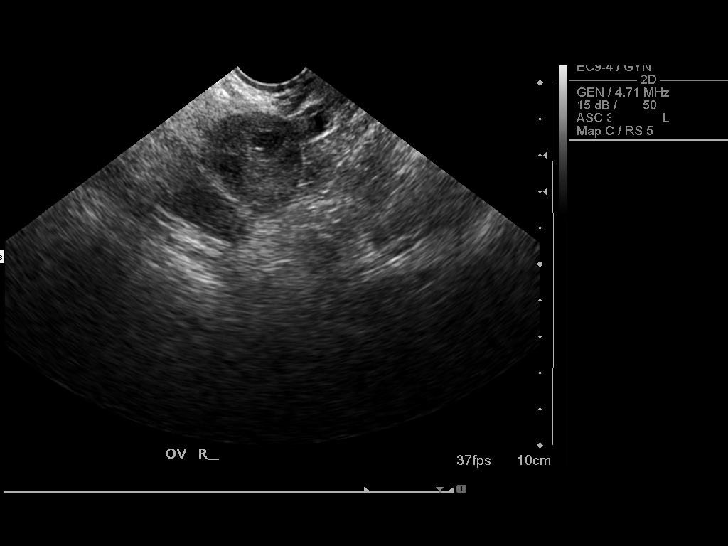
[im 41/41]
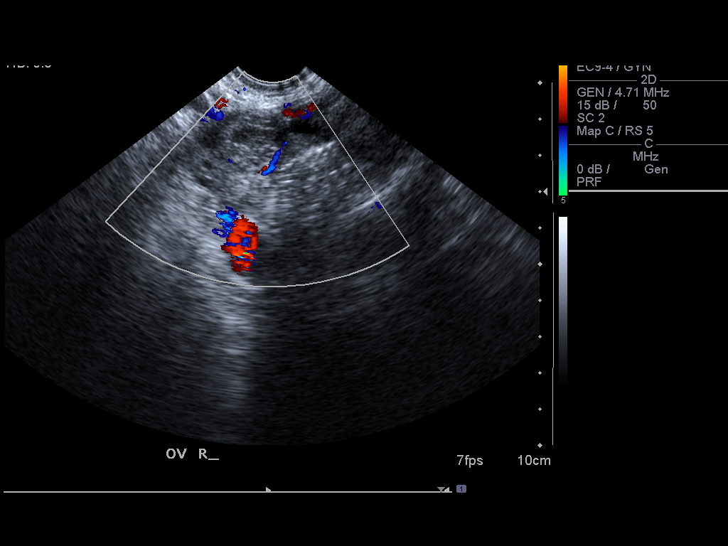

[14 of 25 positions shown; findings below may reference images not displayed]

FINDINGS: Normal appearance of the uterus. Uterus measures 9.7 x 5.2 x 6.0 cm.  The endometrial stripe measures up to 11 mm.  No evidence for free fluid.  Left adnexa measures 1.8 x 2.7 x 2.2 cm.  Small follicles.  The right adnexal measures 2.2 x 3.6 x 2.6 cm.  Slightly complex follicle in the right ovary measuring up to 1.6 cm.
IMPRESSION: 1.  Negative pelvic ultrasound.
 2.  Suggestion for a small complex or hemorrhagic cyst/follicle in the right ovary.

## 2007-11-13 ENCOUNTER — Emergency Department (HOSPITAL_COMMUNITY): Admission: EM | Admit: 2007-11-13 | Discharge: 2007-11-13 | Payer: Self-pay | Admitting: Emergency Medicine

## 2008-03-06 ENCOUNTER — Emergency Department (HOSPITAL_COMMUNITY): Admission: EM | Admit: 2008-03-06 | Discharge: 2008-03-06 | Payer: Self-pay | Admitting: Emergency Medicine

## 2008-03-06 IMAGING — CR DG HAND COMPLETE 3+V*R*
3 series · 3 of 3 positions shown · non-contrast
Comparison: [DATE].

CLINICAL DATA: Pain after injury.

RIGHT HAND - COMPLETE 3+ VIEW

[x hand ap right]
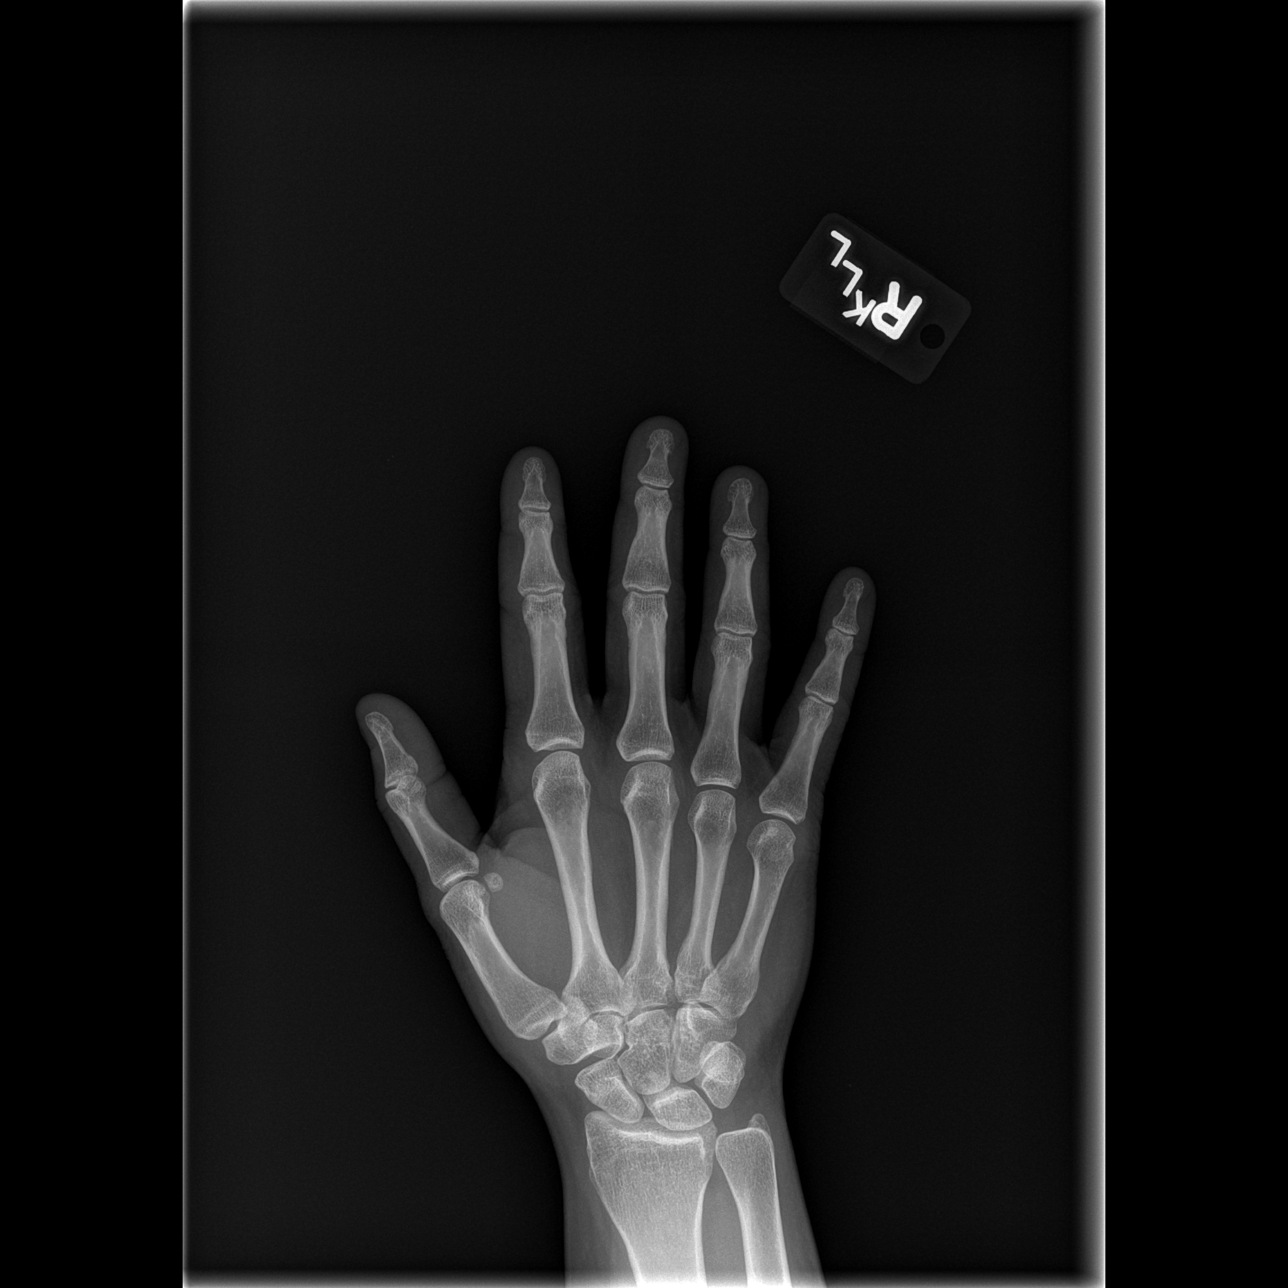

[x hand oblique right]
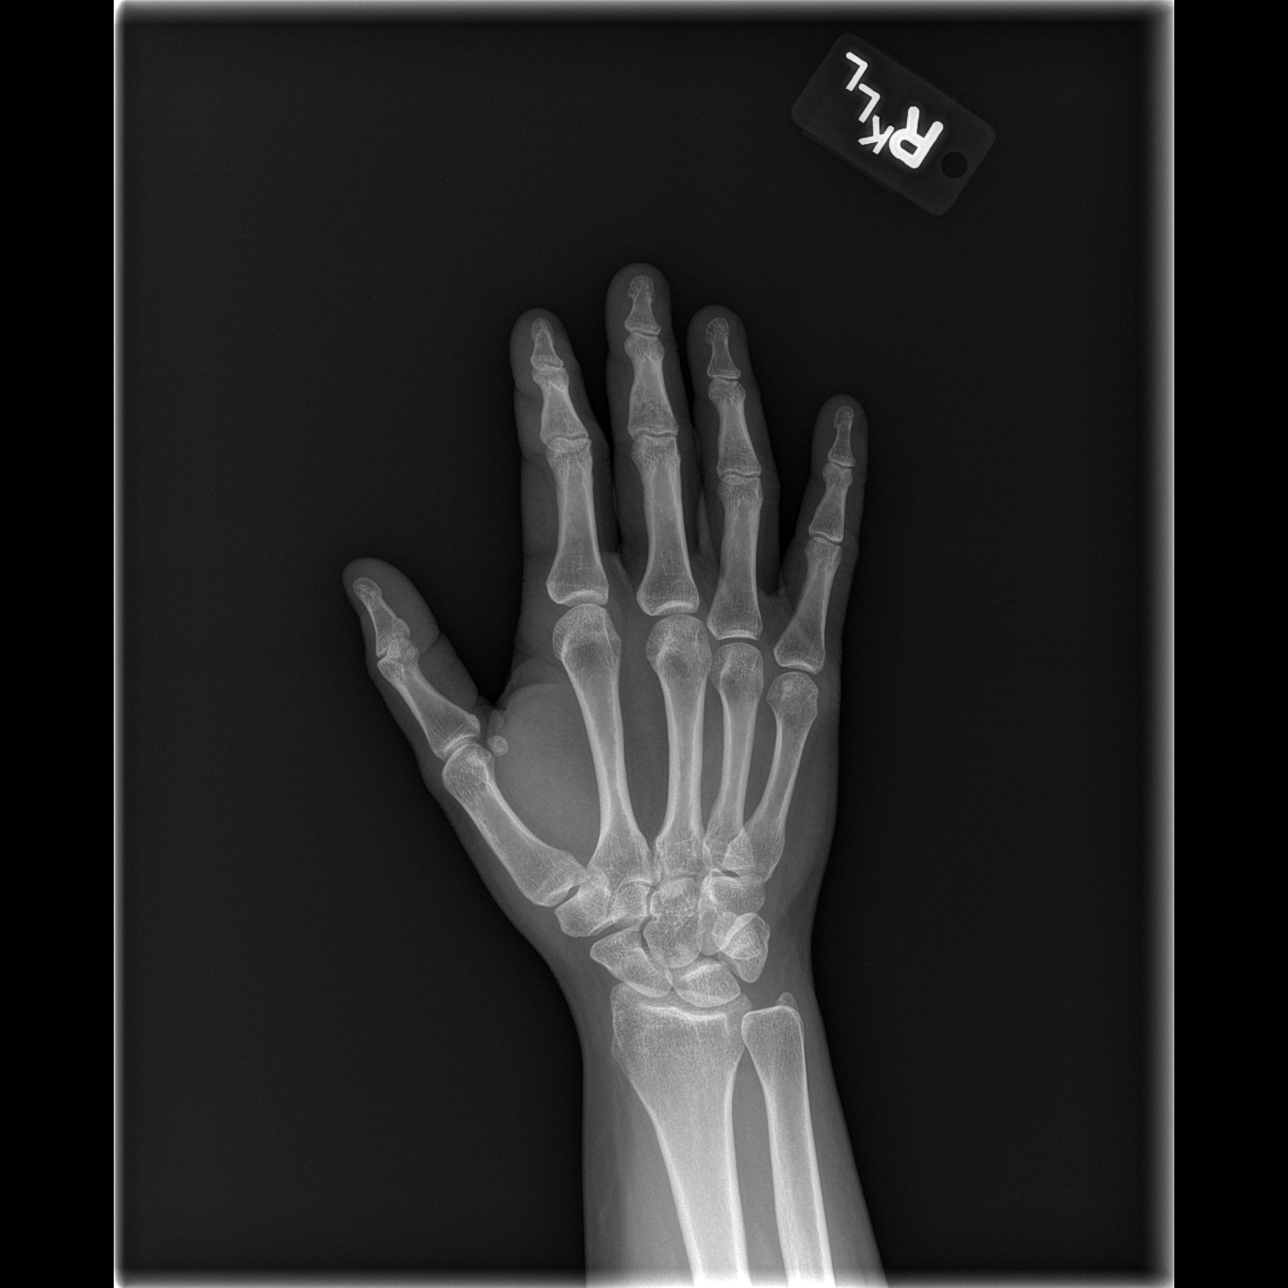

[x hand lat right]
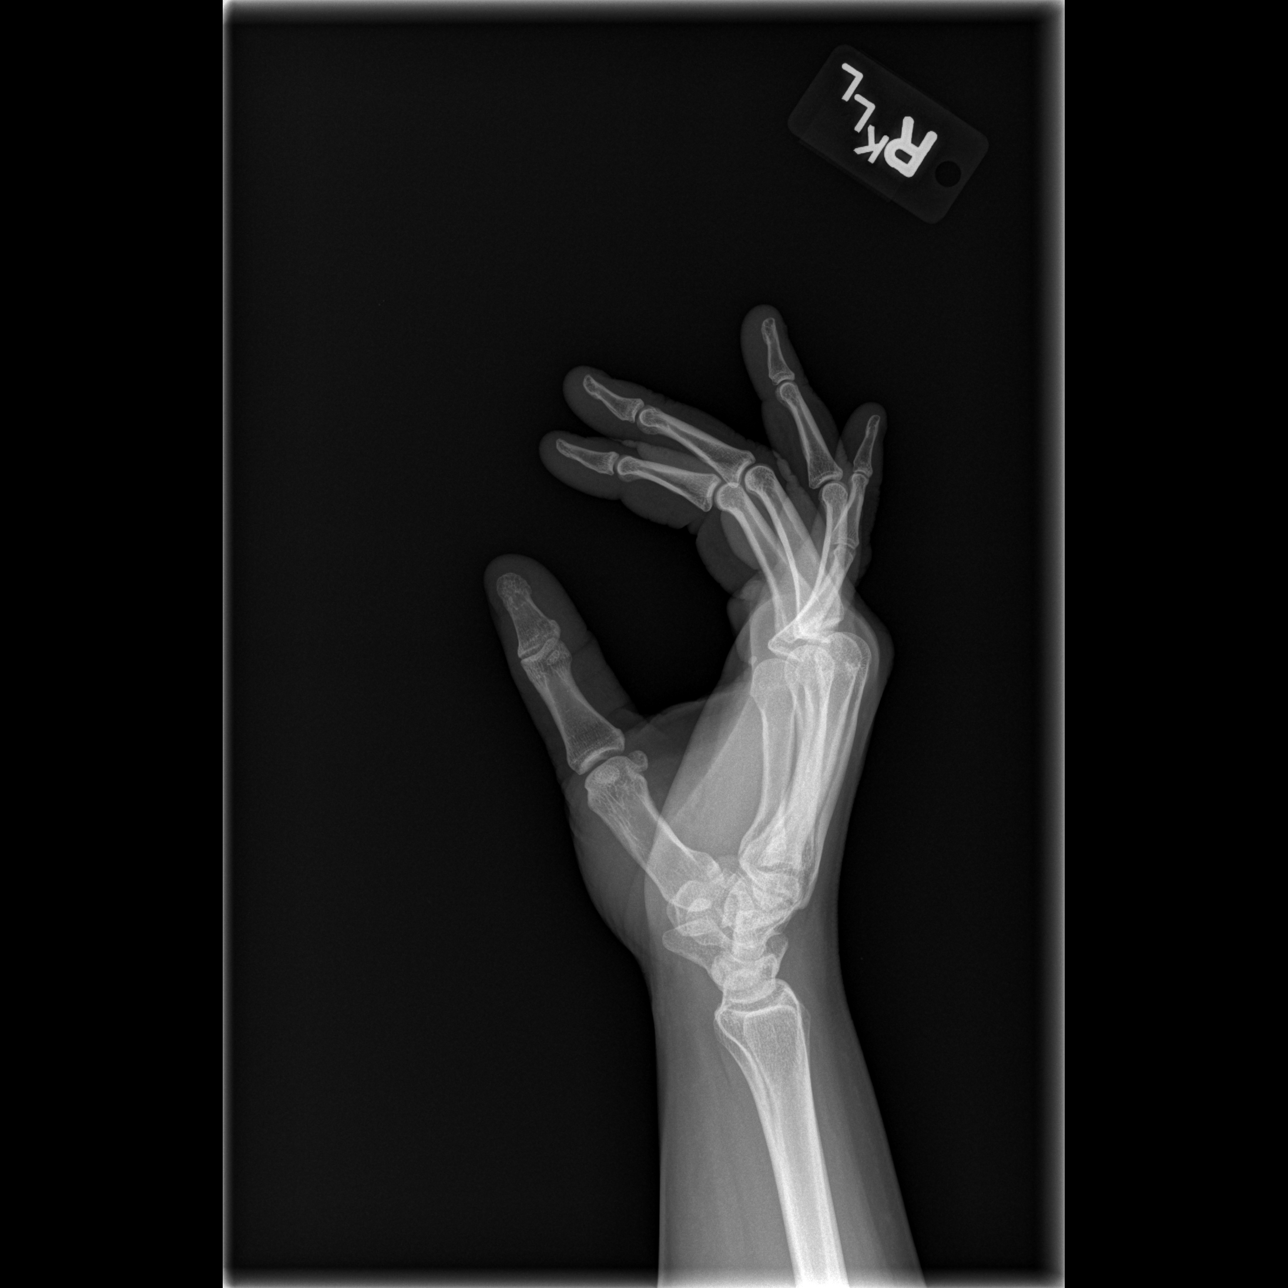

[3 of 3 positions shown; findings below may reference images not displayed]

FINDINGS: Fracture or dislocation.
IMPRESSION: No fracture.

## 2008-10-20 ENCOUNTER — Emergency Department (HOSPITAL_COMMUNITY): Admission: EM | Admit: 2008-10-20 | Discharge: 2008-10-21 | Payer: Self-pay | Admitting: Emergency Medicine

## 2008-10-21 IMAGING — CT CT PELVIS W/ CM
2 of 4 series · 17 of 46 positions shown, 19 images · IV contrast (water & 80 ml omni 300)
Comparison: Pelvic ultrasound and abdominal pelvic CT [DATE].

CT ABDOMEN

CLINICAL DATA: Mid and right lower quadrant abdominal pain with
nausea and diarrhea for four days.

CT ABDOMEN AND PELVIS WITH CONTRAST
TECHNIQUE: Multidetector CT imaging of the abdomen and pelvis was
performed using the standard protocol following bolus
administration of intravenous contrast.
Contrast: 80 ml [0O] intravenously.

[Series 2: routine abdomen · axial · 0.88mm/px · z∈[-418,+12]mm · 14 of 96 slices shown, 16 images]
[im 5/96  soft-tissue]
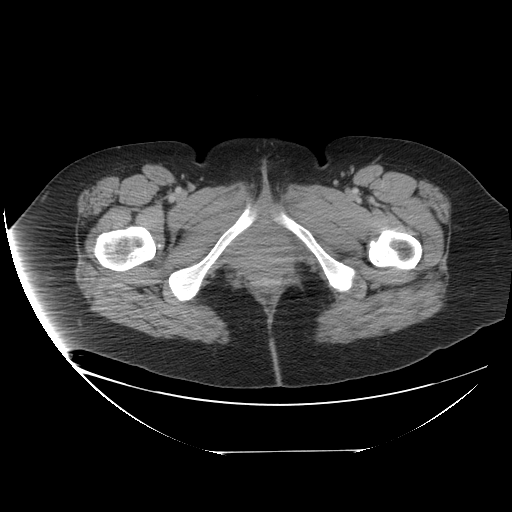
[im 5/96  bone]
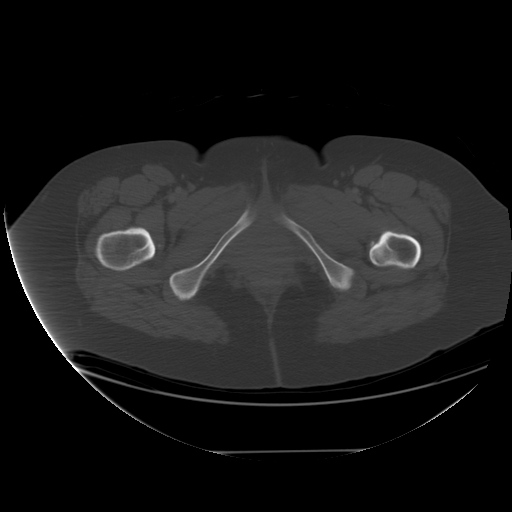
[im 13/96  soft-tissue]
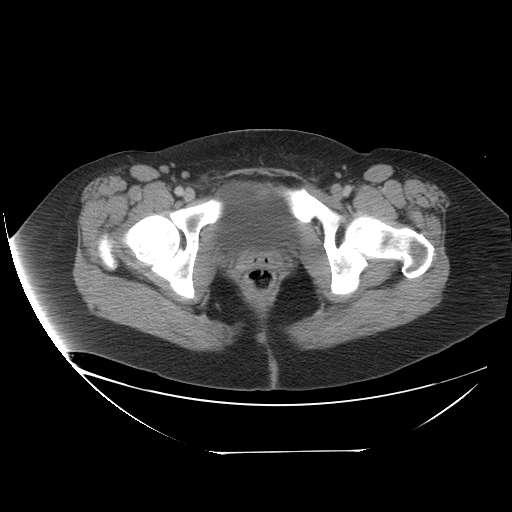
[im 18/96  soft-tissue]
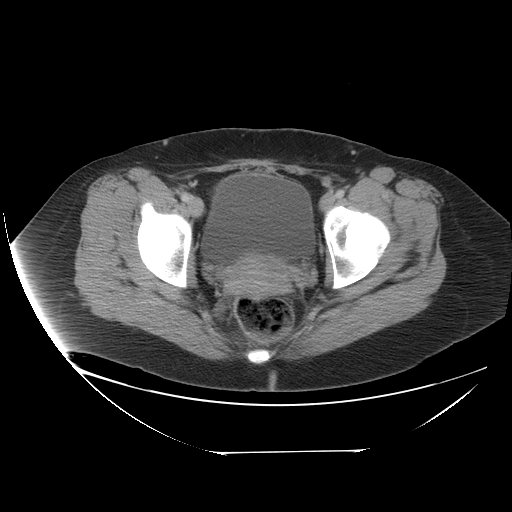
[im 26/96  soft-tissue]
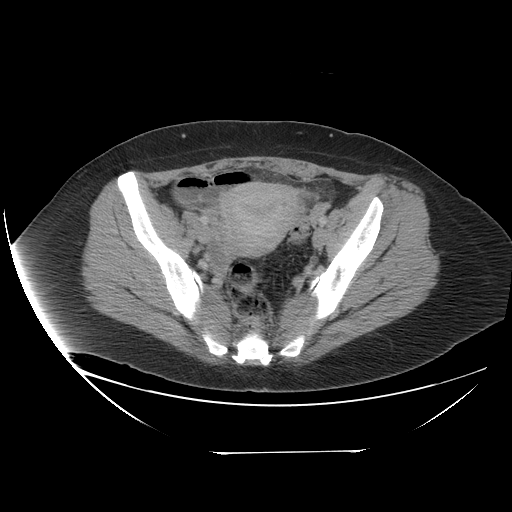
[im 31/96  soft-tissue]
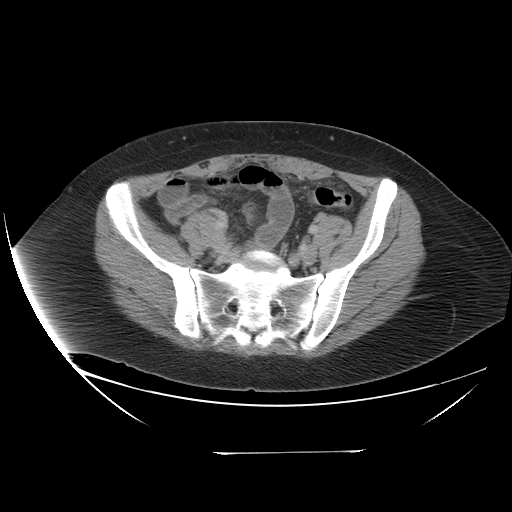
[im 39/96  soft-tissue]
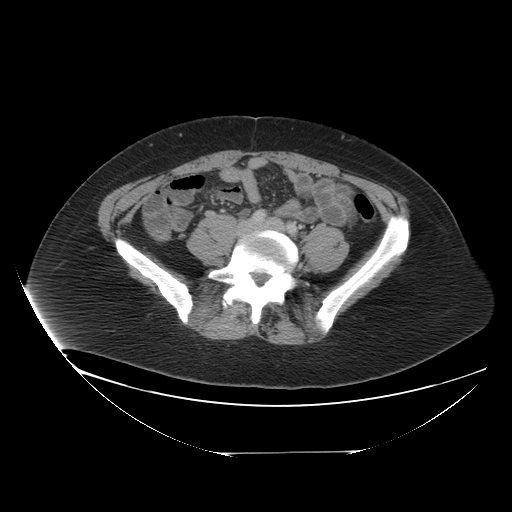
[im 44/96  soft-tissue]
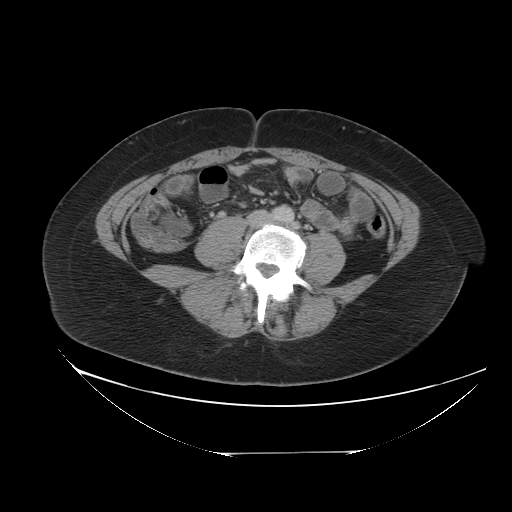
[im 52/96  soft-tissue]
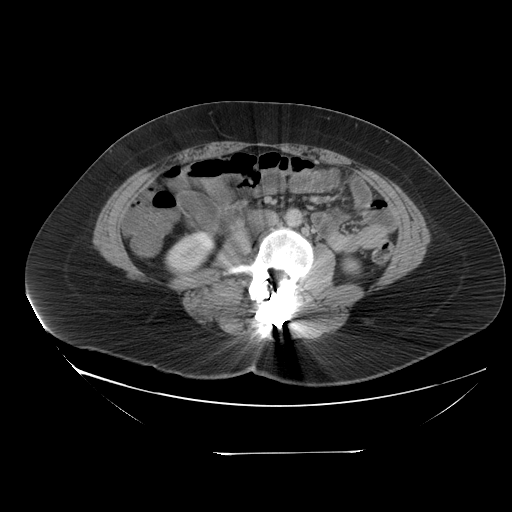
[im 57/96  soft-tissue]
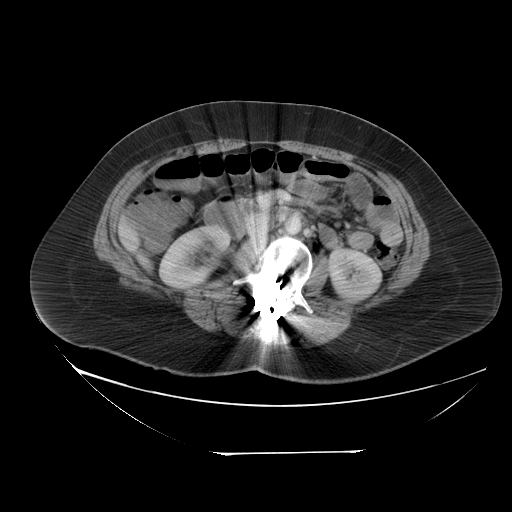
[im 57/96  bone]
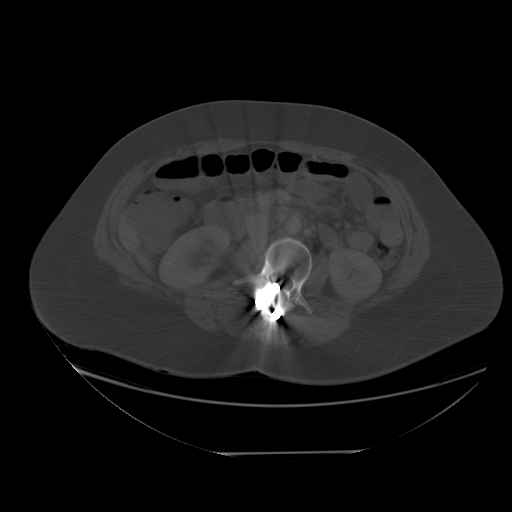
[im 65/96  soft-tissue]
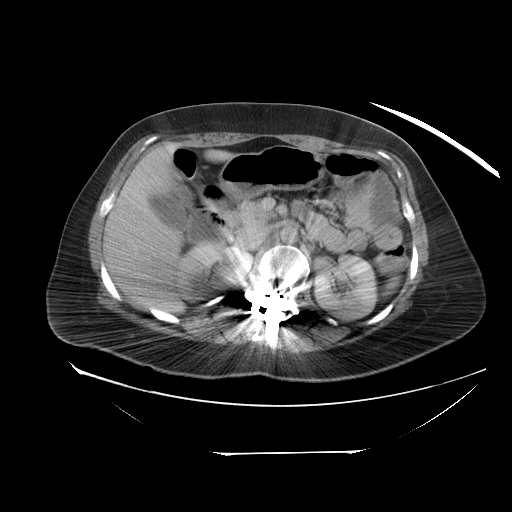
[im 70/96  soft-tissue]
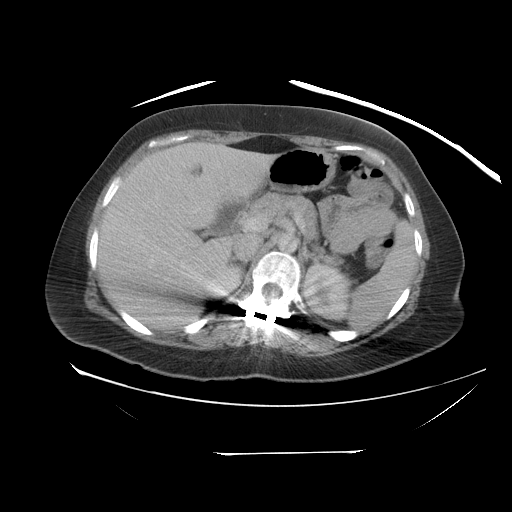
[im 78/96  soft-tissue]
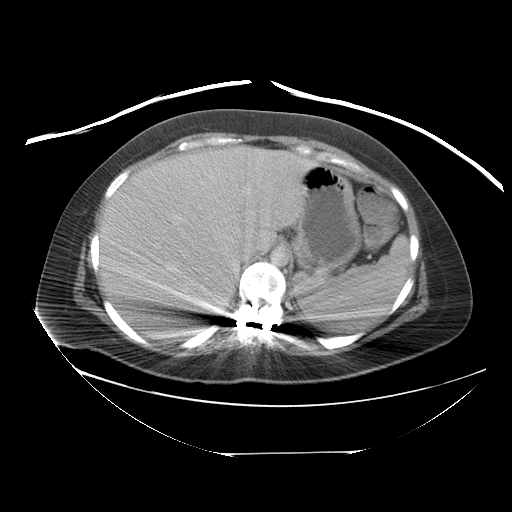
[im 83/96  soft-tissue]
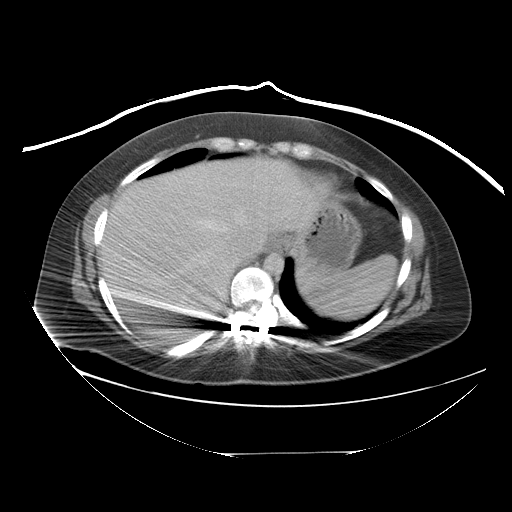
[im 91/96  soft-tissue]
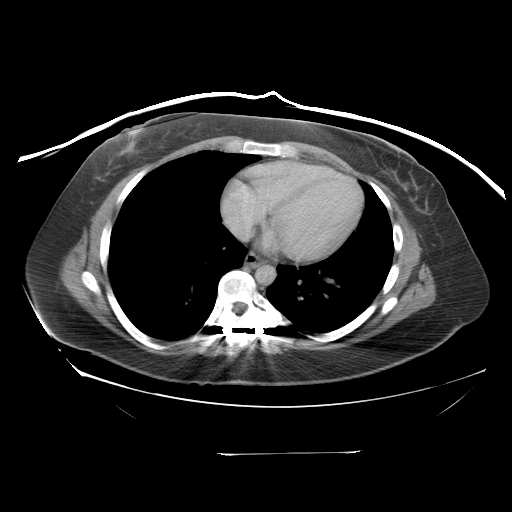

[Series 401: cor · coronal · 0.95mm/px · 3 of 87 slices shown]
[im 29/87  soft-tissue]
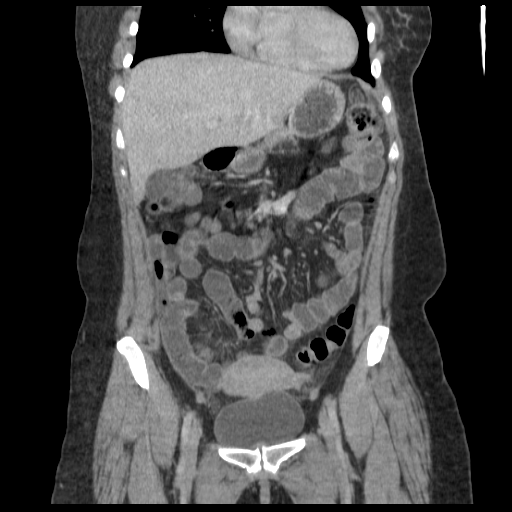
[im 39/87  soft-tissue]
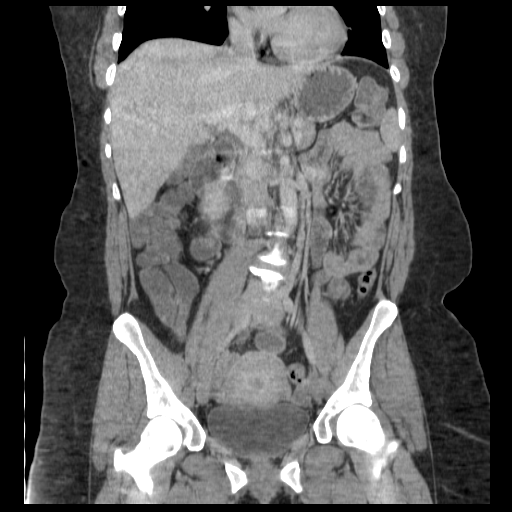
[im 48/87  soft-tissue]
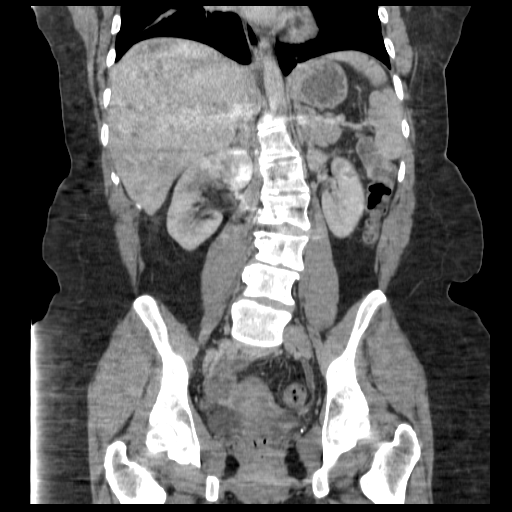

[17 of 46 positions shown; findings below may reference images not displayed]

FINDINGS: Thoracolumbar Harrington rods are noted.  Scoliosis
appears grossly stable.  The lung bases are clear.  The liver,
spleen, gallbladder, pancreas, adrenal glands and kidneys appear
normal.

The stomach, small bowel and colon are fluid filled without
significant distension or wall thickening.  No inflammatory changes
are identified.  Retrocecal appendix appears stable without
surrounding inflammation.
IMPRESSION: Stable CT of the abdomen.  No evidence of appendicitis or other
acute process.

CT PELVIS
FINDINGS: Soft tissue stranding in the fat anterior to the uterus
and superior to the bladder is increased compared with prior
examination.  There is no extraluminal fluid collection.  There is
no apparent bladder wall thickening.  The uterus appears normal.
There is no adnexal mass.  Postsurgical changes in the suprapubic
anterior abdominal wall are unchanged.
IMPRESSION: 1.  New/increased soft tissue stranding in the anterior fat of the
pelvis.  This could reflect mesenteric inflammation.  There is no
focal fluid collection or definite abnormality of the adjacent
urinary bladder; correlation with urinalysis is suggested however.
2.  The uterus and ovaries appear unremarkable.

## 2008-10-24 ENCOUNTER — Ambulatory Visit (HOSPITAL_COMMUNITY): Admission: RE | Admit: 2008-10-24 | Discharge: 2008-10-24 | Payer: Self-pay | Admitting: Obstetrics and Gynecology

## 2009-02-09 ENCOUNTER — Ambulatory Visit (HOSPITAL_COMMUNITY): Admission: RE | Admit: 2009-02-09 | Discharge: 2009-02-09 | Payer: Self-pay | Admitting: Obstetrics and Gynecology

## 2009-07-11 ENCOUNTER — Encounter: Admission: RE | Admit: 2009-07-11 | Discharge: 2009-08-22 | Payer: Self-pay | Admitting: Family Medicine

## 2010-05-28 LAB — CBC
Hemoglobin: 12.8 g/dL (ref 12.0–15.0)
MCV: 92.3 fL (ref 78.0–100.0)
RBC: 4.2 MIL/uL (ref 3.87–5.11)
WBC: 6.6 10*3/uL (ref 4.0–10.5)

## 2010-05-28 LAB — PREGNANCY, URINE: Preg Test, Ur: NEGATIVE

## 2010-06-02 LAB — CBC
HCT: 35.8 % — ABNORMAL LOW (ref 36.0–46.0)
Hemoglobin: 12.2 g/dL (ref 12.0–15.0)
MCHC: 34.1 g/dL (ref 30.0–36.0)
RDW: 12.6 % (ref 11.5–15.5)
WBC: 9.4 10*3/uL (ref 4.0–10.5)

## 2010-06-02 LAB — COMPREHENSIVE METABOLIC PANEL
ALT: 32 U/L (ref 0–35)
BUN: 8 mg/dL (ref 6–23)
CO2: 25 mEq/L (ref 19–32)
GFR calc Af Amer: >60 mL/min (ref >60)
GFR calc non Af Amer: >60 mL/min (ref >60)
Glucose, Bld: 90 mg/dL (ref 70–99)
Total Bilirubin: 1.5 mg/dL — ABNORMAL HIGH (ref 0.3–1.2)
Total Protein: 7.3 g/dL (ref 6.0–8.3)

## 2010-06-02 LAB — URINE MICROSCOPIC-ADD ON

## 2010-06-02 LAB — URINALYSIS, ROUTINE W REFLEX MICROSCOPIC
Glucose, UA: NEGATIVE mg/dL
Ketones, ur: 15 mg/dL — AB
Nitrite: NEGATIVE
Urobilinogen, UA: 2 mg/dL — ABNORMAL HIGH (ref 0.0–1.0)
pH: 7 (ref 5.0–8.0)

## 2010-06-02 LAB — DIFFERENTIAL
Eosinophils Relative: 0 % (ref 0–5)
Lymphocytes Relative: 10 % — ABNORMAL LOW (ref 12–46)
Monocytes Absolute: 0.9 10*3/uL (ref 0.1–1.0)
Neutrophils Relative %: 80 % — ABNORMAL HIGH (ref 43–77)

## 2010-06-02 LAB — POCT PREGNANCY, URINE: Preg Test, Ur: NEGATIVE

## 2010-07-13 NOTE — Discharge Summary (Signed)
[facility]  Patient:    Katie Ramirez, Katie Ramirez                      MRN: [id number] Adm. Date:  [id number] Disc. Date: [id number] Attending:  Miguel Aschoff Dictator:   Leilani Able, P.A.                           Discharge Summary  FINAL DIAGNOSES:              Intrauterine pregnancy at term, status post previous cesarean section and elects for repeat cesarean section.  PROCEDURE:                    Repeat low flap transverse cesarean section.  SURGEON:                      Miguel Aschoff, M.D.  ASSISTANT:                    Brook A. Edward Jolly, M.D.  COMPLICATIONS:                None.  HISTORY:                      This 30 year old G2, P1-0-0-1 presents at term for repeat cesarean section.  Patient had had a prior cesarean section with her last pregnancy and declined VBAC attempt.  She has had an uncomplicated antepartum course.  Patient was taken to the operating room on August 13, 2000 by Dr. Miguel Aschoff where a repeat low flap transverse cesarean section was performed with the delivery of an 8 pound 1 ounce female infant with Apgars of 9 and 9.  Delivery went without complications.  Patients postoperative course was complicated by some low grade temperatures.  Patient had received a couple doses of Ancef after delivery, but then was started on cefotetan for her temperatures.  By postoperative day #3 the postoperative fever had resolved. She was felt ready for discharge on postoperative day #4.  Was sent home on a regular diet.  Told to decrease activities.  Told to continue FeSo4 325 mg one t.i.d.  Was given Tylox one to two q.4h. p.r.n. for pain.  Told to continue prenatal vitamins and to follow-up in the office in four weeks.  DISCHARGE LABORATORIES:       Hemoglobin 8.0, white blood cell count 9.4. DD:  09/10/00 TD:  09/10/00 Job: [id number]

## 2010-07-13 NOTE — Discharge Summary (Signed)
Katie Ramirez, Katie Ramirez              ACCOUNT NO.:  [id number]   MEDICAL RECORD NO.:  [id number]          PATIENT TYPE:  INP   LOCATION:  9143                          FACILITY:  [facility]   PHYSICIAN:  Miguel Aschoff, M.D.       DATE OF BIRTH:  1980-07-19   DATE OF ADMISSION:  12/06/2003  DATE OF DISCHARGE:  12/10/2003                                 DISCHARGE SUMMARY   FINAL DIAGNOSES:  1.  Intrauterine pregnancy at term.  2.  Elective repeat cesarean section.  3.  The patient desires permanent sterilization.   This 30 year old G4 P2-0-1-2 presents at term for repeat cesarean section.  The patient had a history of two cesarean sections.  She is now here for a  repeat cesarean section, also wants tubal ligation for permanent  sterilization.  The patient's antepartum course up to this point has been  uncomplicated.  She had a negative group B strep culture obtained in the  office at 35 weeks.  She is admitted at this time.  She is taken to the  operating room by Dr. Miguel Aschoff on December 06, 2003 where a repeat low  transverse cesarean section was performed with the delivery of a 7-pound 8-  ounce female infant with Apgars of 8 and 9.  Delivery went without  complications.  The patient still expressed her desire for permanent  sterilization and this was performed as well.  The patient tolerated the  procedure and delivery well.  The patient's postoperative course was  uncomplicated.  She was felt ready for discharge on postoperative day #4.  She was kept for some mild ileus so was given some Zofran as needed.  She  was feeling better, was felt ready for discharge on October 15.  She was  sent home on a regular diet, told to continue her Zofran 8 mg q.8h. as  needed, was given Tylox one q.3h. as needed for pain, was also given  Chromagen iron supplement daily.  She was to follow up in the office in 4  weeks, of course to call any sooner with any increased pain or bleeding.   LABORATORY DATA ON  DISCHARGE:  The patient had a hemoglobin of 9.3; white  blood cell count of 6.7; platelets 158,000.      MB/MEDQ  D:  12/29/2003  T:  12/29/2003  Job:  [id number]

## 2010-07-13 NOTE — Op Note (Signed)
[facility]  Patient:    Katie Ramirez, Katie Ramirez                      MRN: [id number] Proc. Date: 08/13/00 Adm. Date:  [id number] Attending:  Miguel Aschoff                           Operative Report  PREOPERATIVE DIAGNOSES:       1. Intrauterine pregnancy at term.                               2. Status post previous cesarean section, now                                  for elective repeat cesarean section.  POSTOPERATIVE DIAGNOSES:      1. Intrauterine pregnancy at term.                               2. Status post previous cesarean section, now                                  for elective repeat cesarean section.                               3. Delivery of viable female infant, Apgars                                  9 and 9.  PROCEDURE:                    Repeat low flap transverse cesarean section.  SURGEON:                      Miguel Aschoff, M.D.  ASSISTANT:                    Brook A. Edward Jolly, M.D.  ANESTHESIA:                   General.  COMPLICATIONS:                None.  JUSTIFICATION:                The patient is a 30 year old white female, gravida 2, para 1-0-0-1 with an estimated date of confinement of August 18, 2000 by ultrasound.  She has had an uncomplicated antepartum course.  The patient declines a VBAC attempt, and is now being taken for repeat cesarean section. In addition, the patient has a Harrington rod, and is felt not to be a candidate for regional anesthesia, and is to undergo this procedure under general anesthesia.  DESCRIPTION OF PROCEDURE:     The patient was taken to the operating room, placed in the supine position, prepped and draped in the usual sterile fashion.  A Foley catheter was inserted.  After this was done, general anesthesia was induced without difficulty.  Her previous Pfannenstiel scar was reincised.  The incision was then extended down through the subcutaneous tissue, with bleeding points  being clamped and  coagulated as they were encountered.  The fascia was then identified and incised transversely.  The rectus muscles were found and divided in the midline.  The peritoneum was then identified and entered, being careful to avoid the underlying structures. The peritoneal incision was extended under direct visualization.  A bladder flap was then created and protected with a bladder blade.  A low transverse incision was then made into the lower uterine segment.  The amniotic cavity was entered and clear fluid was obtained.  At this point, the patient was delivered of a viable female infant with Apgars of 9 at one minute and 9 at five minutes from a vertex LOA position.  A nuchal cord x 2 was noted.  After delivery of the baby, the baby was handed to the pediatric team in attendance. Cord blood were then obtained for appropriate studies.  The placenta was delivered.  The uterus was then evacuated of any remaining products of conception.  The angles of the uterine incision were then identified and ligated using figure-of-eight sutures of 0 Vicryl.  The uterus was closed using running interlocking 0 Vicryl suture followed by an imbricating suture of 0 Vicryl.  The bladder flap was reapproximated using running continuous 2-0 Vicryl suture.  At this point, lap counts were taken and found to be correct.  The abdomen was then closed.  The parietal peritoneum was closed using running continuous 0 Vicryl suture.  The fascia was closed using two sutures of 0 Vicryl from each side of the lateral fascial angles and meeting in the midline.  The subcutaneous tissue and skin was closed using staples. Estimated blood loss was approximately 600 cc.  The patient tolerated the procedure well and went to the recovery room in satisfactory condition. DD:  08/13/00 TD:  08/13/00 Job: [id number]

## 2010-07-13 NOTE — Op Note (Signed)
Katie Ramirez, Katie Ramirez              ACCOUNT NO.:  [id number]   MEDICAL RECORD NO.:  [id number]          PATIENT TYPE:  INP   LOCATION:  9198                          FACILITY:  [facility]   PHYSICIAN:  Miguel Aschoff, M.D.       DATE OF BIRTH:  [birth year]   DATE OF PROCEDURE:  12/06/2003  DATE OF DISCHARGE:                                 OPERATIVE REPORT   PREOPERATIVE DIAGNOSIS:  Intrauterine pregnancy, at term.  For elective  repeat cesarean section.  Desires sterilization.   POSTOPERATIVE DIAGNOSIS:  Intrauterine pregnancy, at term.  For elective  repeat cesarean section.  Desires sterilization.  Delivery of a viable  female infant.  Apgars 8, 9.   PROCEDURE:  Repeat left lateral transverse cesarean section.   SURGEON:  Miguel Aschoff, M.D.   ASSISTANT:  Carrington Clamp, M.D.   ANESTHESIA:  General.   COMPLICATIONS:  None.   JUSTIFICATION:  Patient is a 30 year old white female gravida 4, para 2, 0-1-  2, at 38-1/2 weeks.  Patient is now requesting elective repeat cesarean  section as well as tubal sterilization.  All consent has been signed.  The  risks and benefits of the procedure were discussed with the patient.   PROCEDURE:  Patient was taken to the operating room and placed in a supine  position.  Prepped and draped in the usual sterile fashion.  A Foley  catheter was inserted.  At this point, general anesthesia was administered.  Once this was done, her Pfannenstiel incision was reincised.  The incision  was extended down through the subcutaneous tissue with bleeding points being  clamped and coagulated as they were encountered.  The fascia was identified  and incised transversely.  It was then separated from the underlying rectus  muscles.  The rectus muscles were divided in the midline.  The peritoneum  was then found and entered,  carefully avoiding the underlying structures.  At this point, a bladder flap was created and protected with a bladder  blade.  Her lower uterine  segment was noted to be incredibly thin.  It was  then incised.  Clear fluid was obtained, rupturing the amniotic membranes.  At this point, the patient delivered a viable female infant from a vertex  LOA position.  Apgar score was 8 at one minute and 9 at five minutes.  The  baby weighed 7 pounds, 8 ounces.  Cord bloods were then obtained for the  appropriate studies.  The uterus was then evacuated of the remaining  products of conception.  At this point, I then closed up the uterine  incision.  We ligated using figure-of-eight sutures of #1 Vicryl.  The  uterus was closed in layers.  The first layer was a running, interlocking  suture of #1 Vicryl followed by an embrocating suture of #1 Vicryl  The  bladder flap was reapproximated using a running continuous 2-0 Vicryl  suture.  At this point, attention was directed to the right tube.  It was  grasped from its mid portion, traced out to its fimbriated end for positive  identification, and the __________ tube  was ligated with two ligatures of 0  plain gut.  A portion of the tube of both ligatures was then excised, and  the tube was __________ cauterized.  The identical procedure was then  carried out on the left side.  At this point, lap counts were taken and  found to be correct.  The abdomen was irrigated with warm saline, and then  the abdomen was closed.  The parietal peritoneum was closed using a running,  continuous 0 Vicryl suture.  The rectus muscles were reapproximated using a  running, continuous 0 Vicryl suture.  The fascia was closed using continuous  sutures of 0 Vicryl, each starting at the lateral fascial angles and meeting  in the midline.  Subcutaneous tissue was closed using interrupted 0 Vicryl  suture, and the skin incision was closed using staples.  The estimated blood  loss for the procedure was approximately 600 cc.  Patient tolerated the  procedure well and went to the recovery room in satisfactory  condition.      AR/MEDQ  D:  12/06/2003  T:  12/06/2003  Job:  [id number]

## 2010-11-16 LAB — CBC
Hemoglobin: 12.6
MCHC: 33.8
MCV: 89.5
RBC: 4.18
RDW: 12.7
WBC: 4.5

## 2010-11-16 LAB — COMPREHENSIVE METABOLIC PANEL
ALT: 34
Albumin: 4
Alkaline Phosphatase: 55
Calcium: 8.9
GFR calc Af Amer: >60
GFR calc non Af Amer: >60
Potassium: 3.8
Sodium: 140
Total Bilirubin: 0.9

## 2010-11-16 LAB — URINALYSIS, ROUTINE W REFLEX MICROSCOPIC
Glucose, UA: NEGATIVE
Hgb urine dipstick: NEGATIVE
Ketones, ur: NEGATIVE
Nitrite: NEGATIVE
Protein, ur: NEGATIVE
Protein, ur: NEGATIVE
Urobilinogen, UA: 1
pH: 6

## 2010-11-16 LAB — POCT PREGNANCY, URINE
Operator id: 161631
Operator id: 28886

## 2010-11-16 LAB — URINE MICROSCOPIC-ADD ON

## 2010-11-16 LAB — WET PREP, GENITAL: Trich, Wet Prep: NONE SEEN

## 2010-11-16 LAB — GC/CHLAMYDIA PROBE AMP, GENITAL: Chlamydia, DNA Probe: NEGATIVE

## 2010-11-16 LAB — D-DIMER, QUANTITATIVE: D-Dimer, Quant: <0.22

## 2011-01-31 ENCOUNTER — Other Ambulatory Visit: Payer: Self-pay | Admitting: Family Medicine

## 2011-01-31 DIAGNOSIS — M549 Dorsalgia, unspecified: Secondary | ICD-10-CM

## 2011-02-05 ENCOUNTER — Ambulatory Visit
Admission: RE | Admit: 2011-02-05 | Discharge: 2011-02-05 | Disposition: A | Discharge status: Home / Self Care | Payer: Medicaid Other | Source: Ambulatory Visit | Attending: Family Medicine | Admitting: Family Medicine

## 2011-02-05 ENCOUNTER — Other Ambulatory Visit: Payer: Self-pay | Admitting: Family Medicine

## 2011-02-05 DIAGNOSIS — M549 Dorsalgia, unspecified: Secondary | ICD-10-CM

## 2011-02-05 MED ORDER — METHYLPREDNISOLONE ACETATE 40 MG/ML INJ SUSP (RADIOLOG
120.0000 mg | Freq: Once | INTRAMUSCULAR | Status: AC
  Administered 2011-02-05: 120 mg via INTRA_ARTICULAR

## 2011-02-05 MED ORDER — IOHEXOL 180 MG/ML  SOLN
1.0000 mL | Freq: Once | INTRAMUSCULAR | Status: AC | PRN
  Administered 2011-02-05: 1 mL via INTRA_ARTICULAR

## 2011-02-05 NOTE — Patient Instructions (Signed)

## 2011-02-26 HISTORY — PX: SPINE SURGERY: SHX786

## 2011-05-27 ENCOUNTER — Other Ambulatory Visit: Payer: Self-pay | Admitting: Neurological Surgery

## 2011-05-27 ENCOUNTER — Other Ambulatory Visit (HOSPITAL_COMMUNITY): Payer: Self-pay | Admitting: Neurological Surgery

## 2011-05-27 DIAGNOSIS — M545 Low back pain: Secondary | ICD-10-CM

## 2011-06-11 ENCOUNTER — Ambulatory Visit (HOSPITAL_COMMUNITY)
Admission: RE | Admit: 2011-06-11 | Discharge: 2011-06-11 | Disposition: A | Discharge status: Home / Self Care | Payer: Medicaid Other | Source: Ambulatory Visit | Attending: Neurological Surgery | Admitting: Neurological Surgery

## 2011-06-11 DIAGNOSIS — M48061 Spinal stenosis, lumbar region without neurogenic claudication: Secondary | ICD-10-CM | POA: Insufficient documentation

## 2011-06-11 DIAGNOSIS — M4120 Other idiopathic scoliosis, site unspecified: Secondary | ICD-10-CM | POA: Insufficient documentation

## 2011-06-11 DIAGNOSIS — M545 Low back pain, unspecified: Secondary | ICD-10-CM | POA: Insufficient documentation

## 2011-06-11 IMAGING — RF DG MYELOGRAM LUMBAR
11 series · 11 of 11 positions shown · IV contrast (omnipaque)
Comparison: none

CLINICAL DATA: 30-year-old female with low back pain, greater on
the left.  History of scoliosis and Harrington rods.

CT abdomen and pelvis [DATE].  Lumbar MRI [DATE].
MYELOGRAM LUMBAR
TECHNIQUE: Intrathecal contrast was administered by Dr. PRIDE
PRIDE  via lumbar puncture at the L4-L5 level. Following injection
of intrathecal Omnipaque contrast, spine imaging in multiple
projections was performed using fluoroscopy.
Fluoroscopy Time: 1.1 minutes.
TECHNIQUE: CT imaging of the lumbar spine was performed after
intrathecal contrast administration.  Multiplanar CT image
reconstructions were also generated.

[Series 1: run · 1 of 1 slices shown (1 of 11)]
[im 1/1]
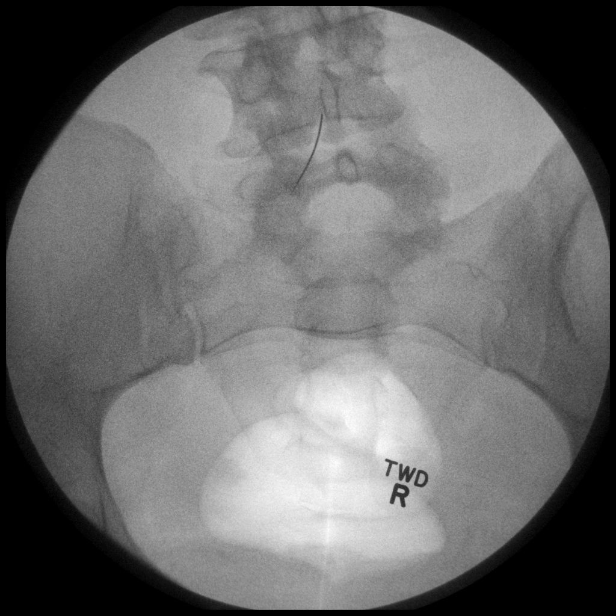

[Series 2: run · 1 of 1 slices shown (2 of 11)]
[im 1/1]
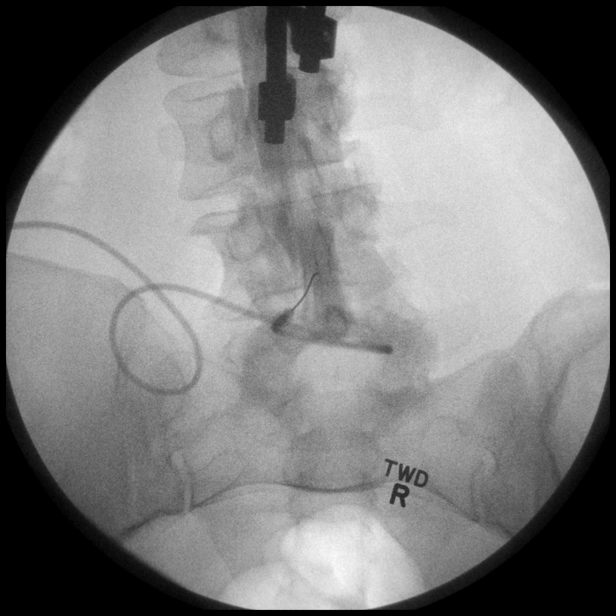

[Series 3: run · 1 of 1 slices shown (3 of 11)]
[im 1/1]
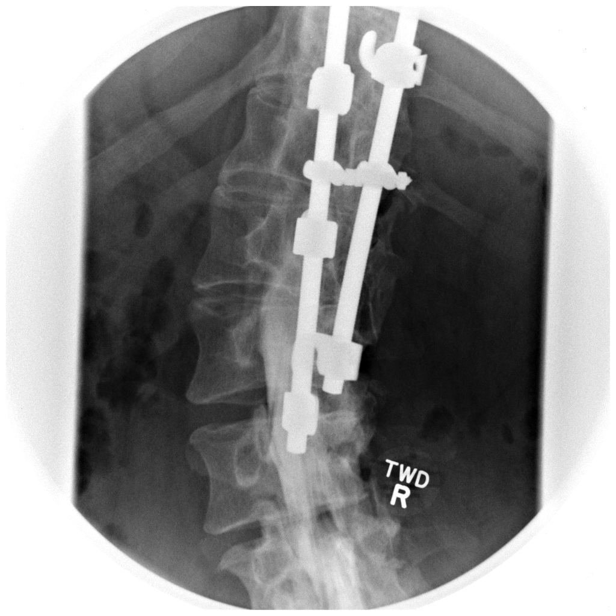

[Series 4: run · 1 of 1 slices shown (4 of 11)]
[im 1/1]
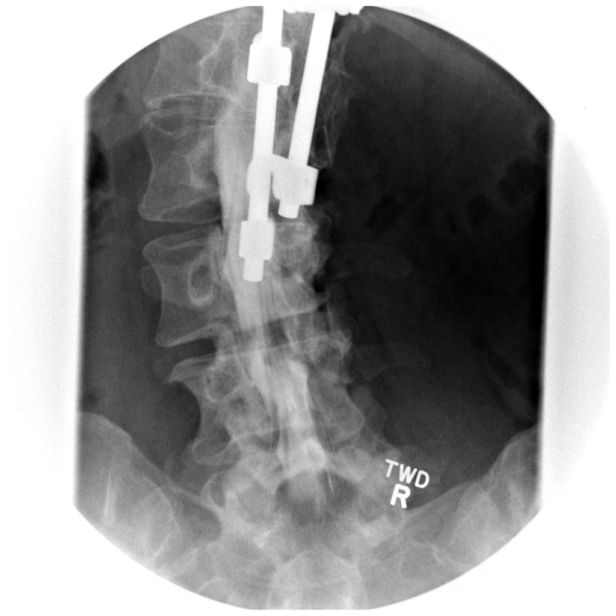

[Series 5: run · 1 of 1 slices shown (5 of 11)]
[im 1/1]
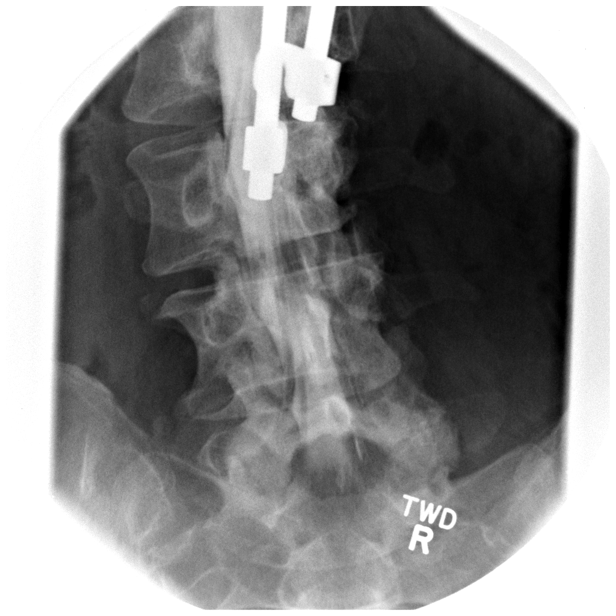

[Series 6: run · 1 of 1 slices shown (6 of 11)]
[im 1/1]
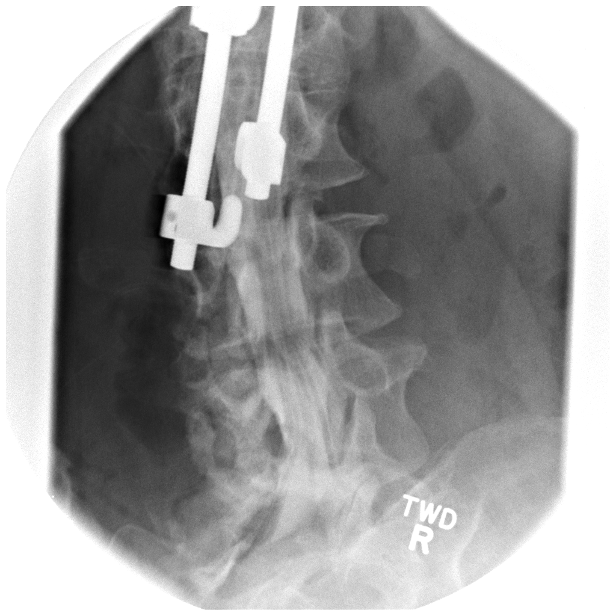

[Series 7: run · 1 of 1 slices shown (7 of 11)]
[im 1/1]
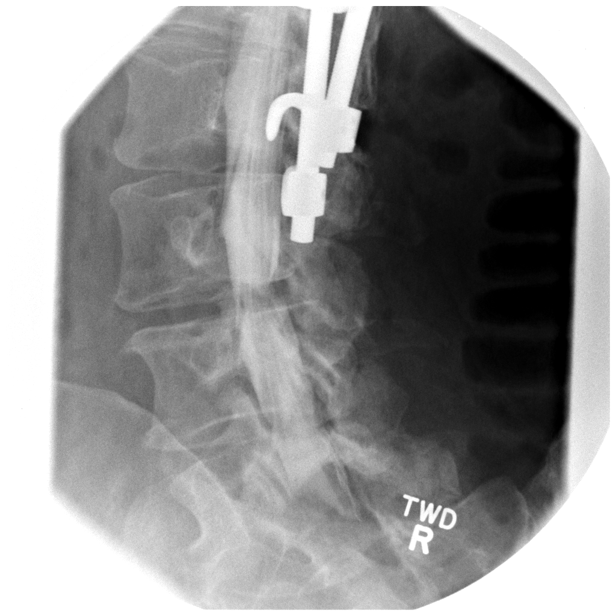

[Series 8: run · 1 of 1 slices shown (8 of 11)]
[im 1/1]
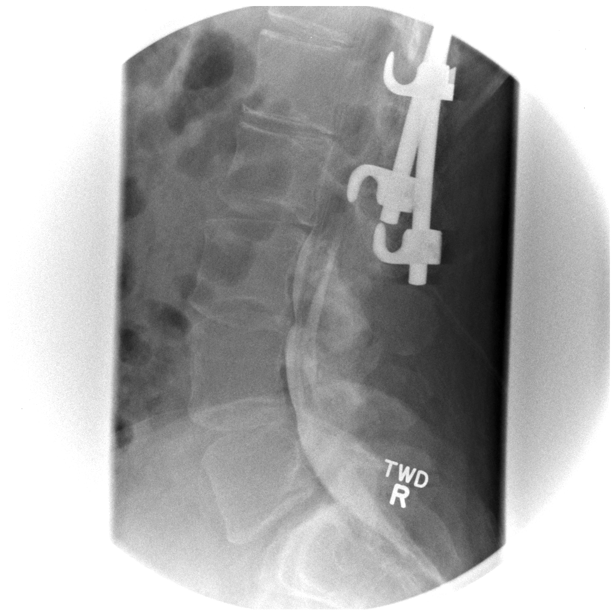

[Series 9: run · 1 of 1 slices shown (9 of 11)]
[im 1/1]
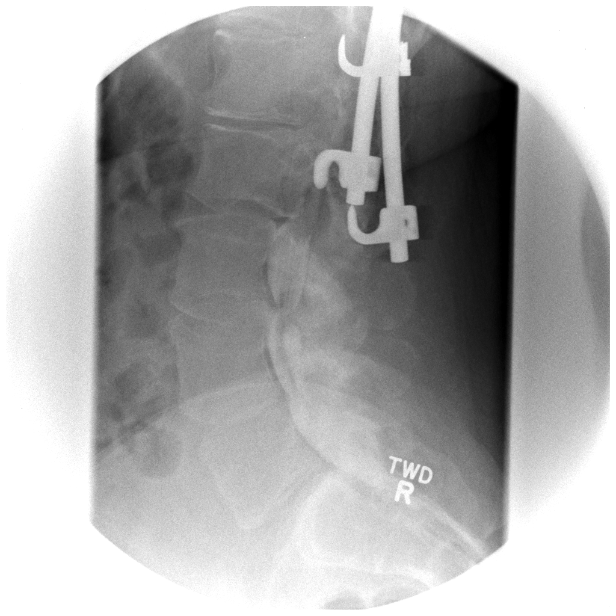

[Series 10: run · 1 of 1 slices shown (10 of 11)]
[im 1/1]
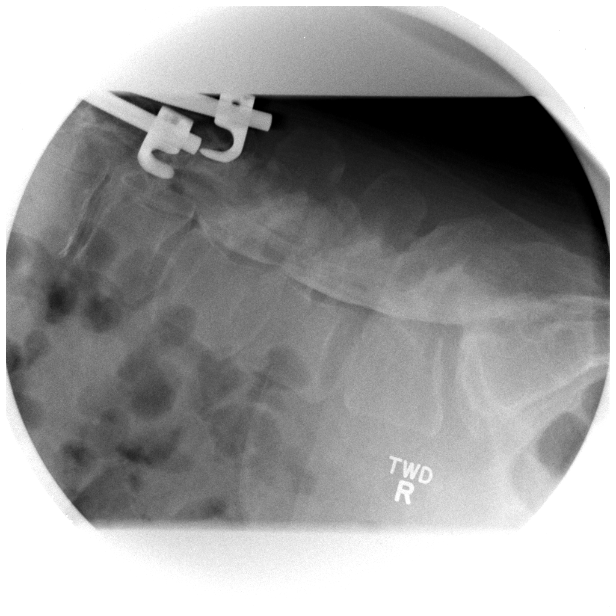

[Series 11: run · 1 of 1 slices shown (11 of 11)]
[im 1/1]
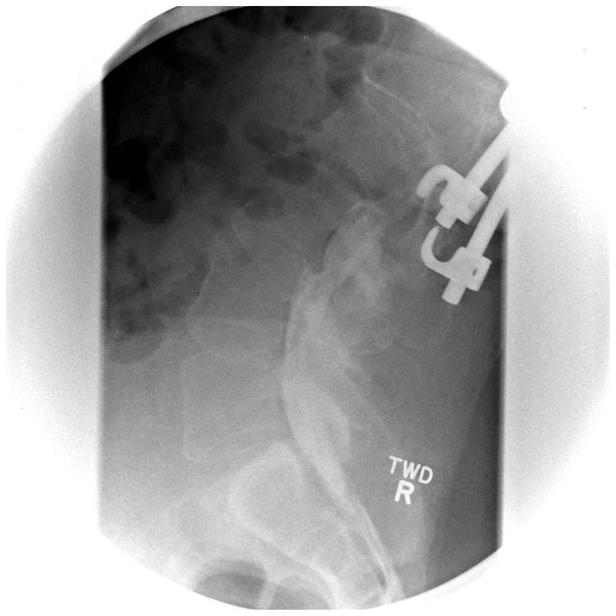

[11 of 11 positions shown; findings below may reference images not displayed]

FINDINGS: Harrington rods extend from the thoracic spine in the
upper lumbar spine terminating at the L2-L3 lamina.  Adequate
intrathecal contrast.  Levoconvex scoliosis.  Attenuation of
contrast at the L3-L4 disc space level and greater at the right
lateral recess.  No definite left lateral recess stenosis.  Supine
and standing lateral views of the spine demonstrate stable
vertebral height alignment.  Ventral defect on the thecal sac at L3-
L4 is stable in an standing neutral position, and stable or mildly
decreased in flexion.  It does appear exacerbated by extension.  No
abnormal motion identified.
IMPRESSION: 1.  Harrington rod type hardware extends caudally to the L2-L3
lamina level.
2.  L3-L4 spinal stenosis suspected, greater on the right.
3. See post myelogram CT findings below.

CT MYELOGRAPHY LUMBAR SPINE
FINDINGS: Negative visualized abdominal viscera.  Streak artifact
related to the spinal rods.  Ankylosis of the posterior elements
from the lower thoracic spine to the L2 lamina and spinous process.
Visualized sacrum and SI joints are within normal limits.  Stable
lumbar vertebral height and alignment.

Good intrathecal contrast opacification.  Widely patent spinal
canal and thecal sac at T11-T12, T12-L1, and L1-L2.

L2-L3:  Moderate to severe facet hypertrophy.  Negative disc.  No
significant stenosis.

L3-L4:  Circumferential disc osteophyte complex.  Severe facet
hypertrophy.  Vacuum facet phenomenon on the left.  Ligament flavum
hypertrophy.  Mild to moderate spinal stenosis with right lateral
recess stenosis (series 3 image 64).  Mild right L3 foraminal
stenosis.

L4-L5:  Severe facet hypertrophy, very severe on the left.  Mild
circumferential disc osteophyte complex.  No significant spinal or
lateral recess stenosis.  Mild bilateral L4 foraminal stenosis.

L5-S1:  Moderate to severe facet hypertrophy greater on the right.
No spinal or lateral recess stenosis.  No significant disc bulge.
Mild if any bilateral L5 foraminal stenosis.
IMPRESSION: 1.  Scoliosis with posterior spinal rods and associated posterior
laminar ankylosis from the thoracic spine to L2.
2.  Severe facet degeneration from L2-L3 through L5-S1 is greater
on the left side except at L5-S1.  Consider this as a source of
left greater than right low back pain.
3.  There is multifactorial mild to moderate spinal and right
lateral recess stenosis at L3-L4.

## 2011-06-11 IMAGING — CT CT L SPINE W/ CM
3 of 11 series · 14 of 34 positions shown, 15 images · IV contrast (omnipaque)
Comparison: none

CLINICAL DATA: 30-year-old female with low back pain, greater on
the left.  History of scoliosis and Harrington rods.

CT abdomen and pelvis [DATE].  Lumbar MRI [DATE].
MYELOGRAM LUMBAR
TECHNIQUE: Intrathecal contrast was administered by Dr. PRIDE
PRIDE  via lumbar puncture at the L4-L5 level. Following injection
of intrathecal Omnipaque contrast, spine imaging in multiple
projections was performed using fluoroscopy.
Fluoroscopy Time: 1.1 minutes.
TECHNIQUE: CT imaging of the lumbar spine was performed after
intrathecal contrast administration.  Multiplanar CT image
reconstructions were also generated.

[Series 3: 2mm axial soft tissue · axial · 0.41mm/px · z∈[-341,-185]mm · 5 of 118 slices shown]
[im 20/118  soft-tissue]
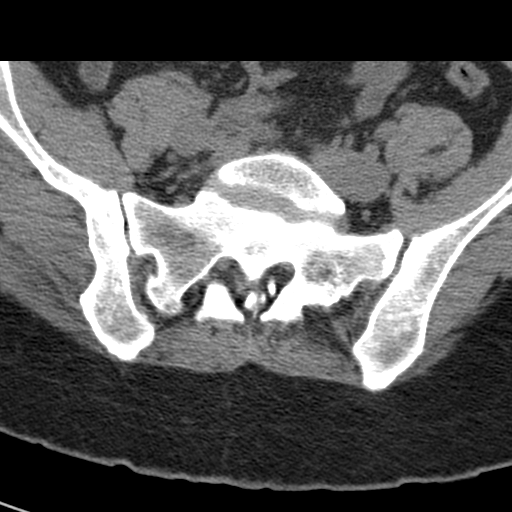
[im 40/118  soft-tissue]
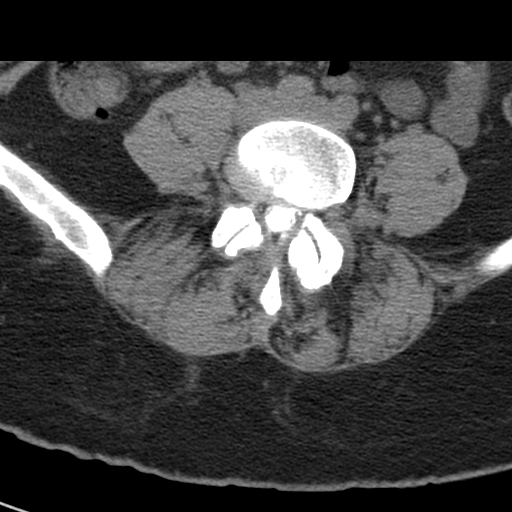
[im 59/118  soft-tissue]
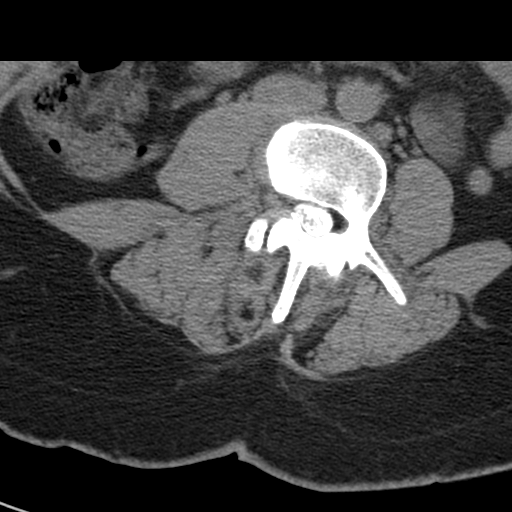
[im 79/118  soft-tissue]
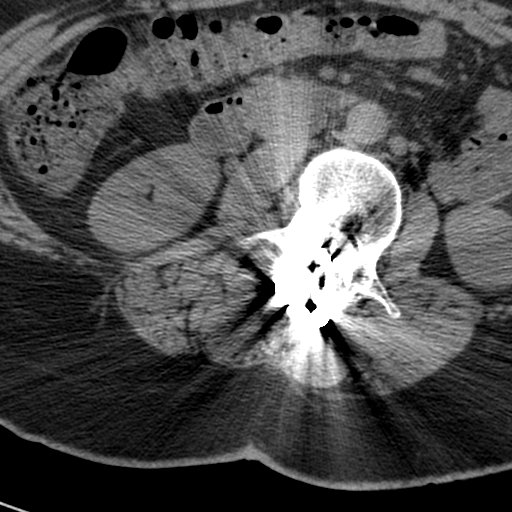
[im 98/118  soft-tissue]
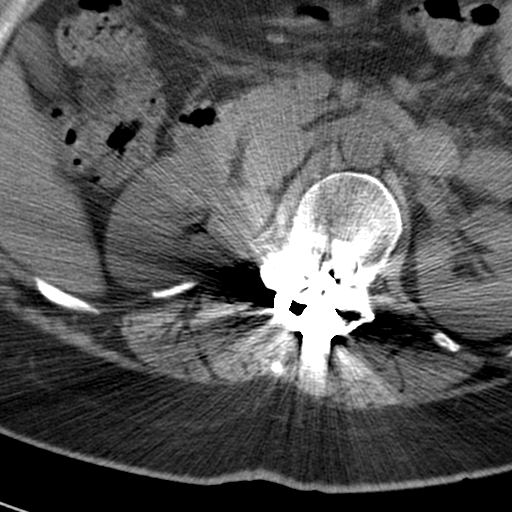

[Series 9: sagittals bone · sagittal · 0.43mm/px · 6 of 88 slices shown]
[im 15/88  bone]
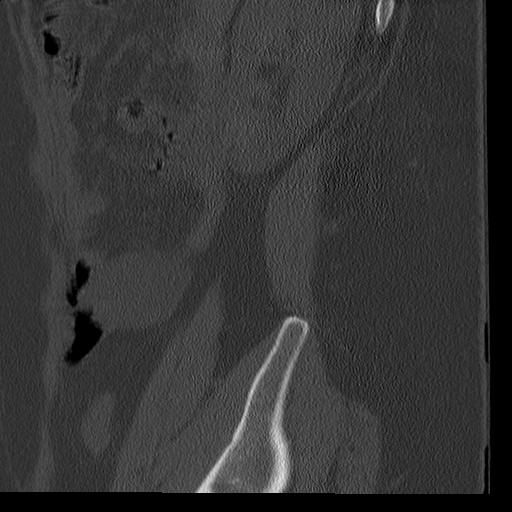
[im 30/88  bone]
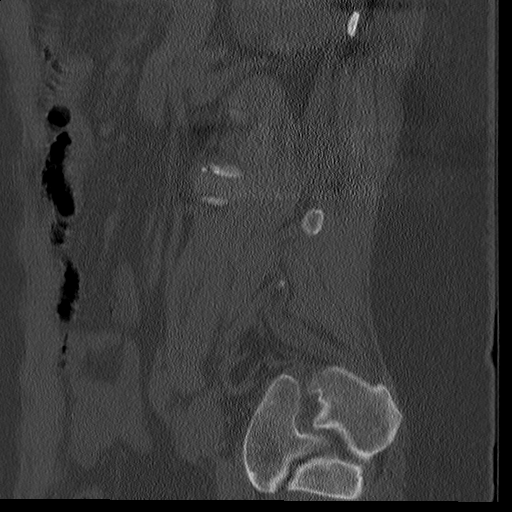
[im 32/88  soft-tissue]
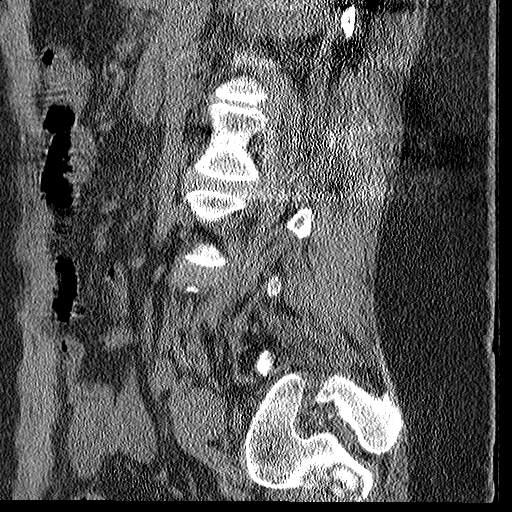
[im 44/88  bone]
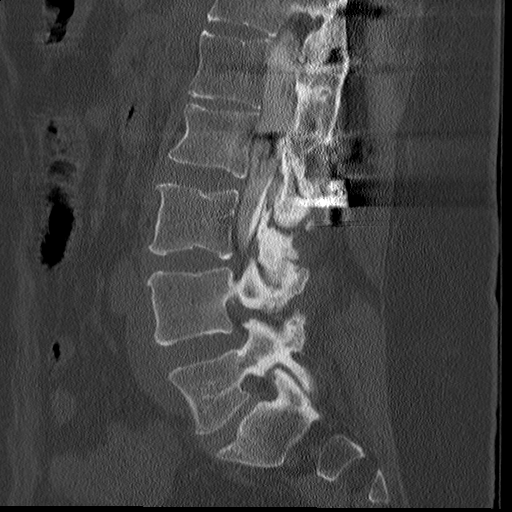
[im 59/88  bone]
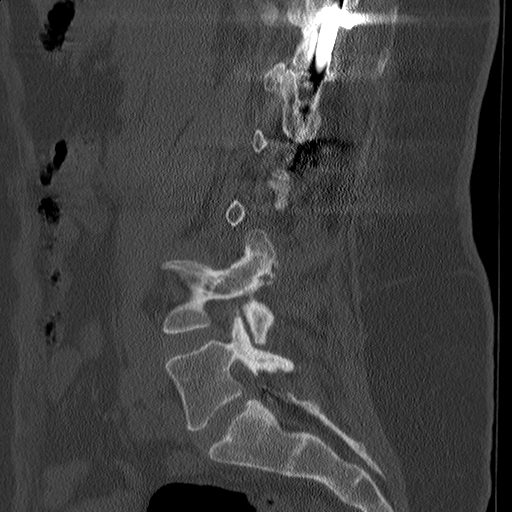
[im 73/88  bone]
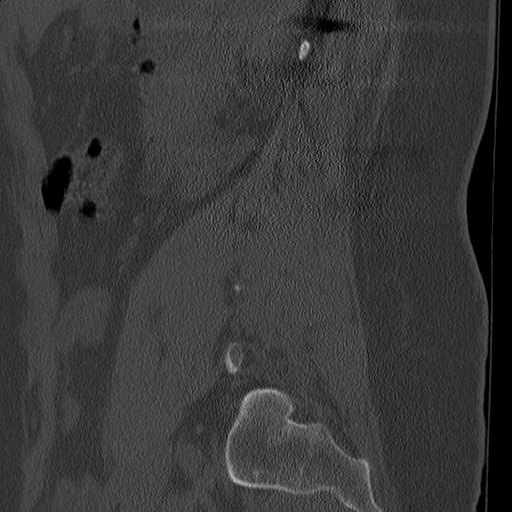

[Series 606: orthos · axial · 0.36mm/px · z∈[-402,-304]mm · 3 of 52 slices shown, 4 images]
[im 1/52  soft-tissue]
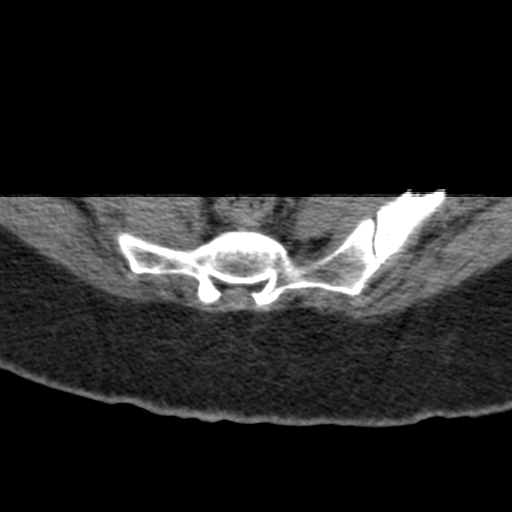
[im 1/52  bone]
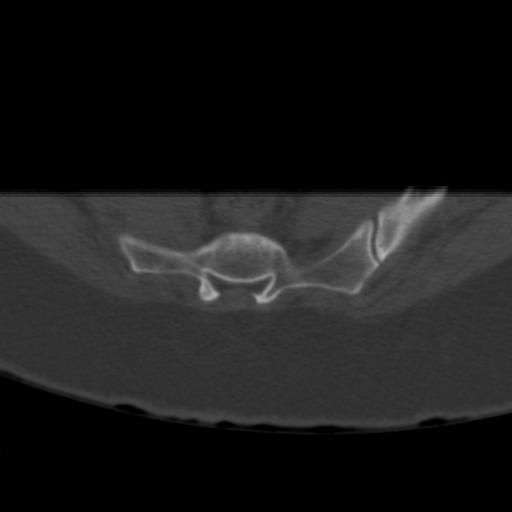
[im 26/52  bone]
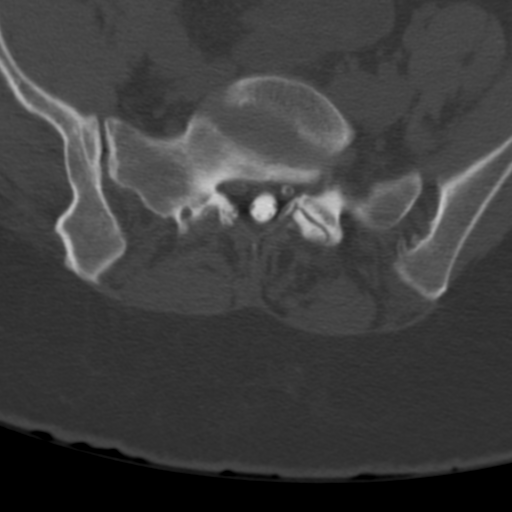
[im 52/52  bone]
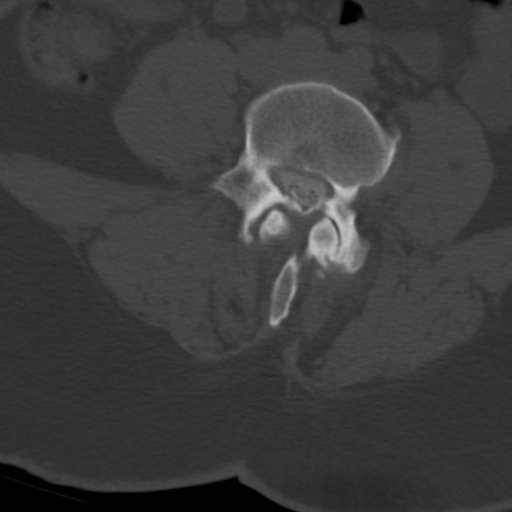

[14 of 34 positions shown; findings below may reference images not displayed]

FINDINGS: Harrington rods extend from the thoracic spine in the
upper lumbar spine terminating at the L2-L3 lamina.  Adequate
intrathecal contrast.  Levoconvex scoliosis.  Attenuation of
contrast at the L3-L4 disc space level and greater at the right
lateral recess.  No definite left lateral recess stenosis.  Supine
and standing lateral views of the spine demonstrate stable
vertebral height alignment.  Ventral defect on the thecal sac at L3-
L4 is stable in an standing neutral position, and stable or mildly
decreased in flexion.  It does appear exacerbated by extension.  No
abnormal motion identified.
IMPRESSION: 1.  Harrington rod type hardware extends caudally to the L2-L3
lamina level.
2.  L3-L4 spinal stenosis suspected, greater on the right.
3. See post myelogram CT findings below.

CT MYELOGRAPHY LUMBAR SPINE
FINDINGS: Negative visualized abdominal viscera.  Streak artifact
related to the spinal rods.  Ankylosis of the posterior elements
from the lower thoracic spine to the L2 lamina and spinous process.
Visualized sacrum and SI joints are within normal limits.  Stable
lumbar vertebral height and alignment.

Good intrathecal contrast opacification.  Widely patent spinal
canal and thecal sac at T11-T12, T12-L1, and L1-L2.

L2-L3:  Moderate to severe facet hypertrophy.  Negative disc.  No
significant stenosis.

L3-L4:  Circumferential disc osteophyte complex.  Severe facet
hypertrophy.  Vacuum facet phenomenon on the left.  Ligament flavum
hypertrophy.  Mild to moderate spinal stenosis with right lateral
recess stenosis (series 3 image 64).  Mild right L3 foraminal
stenosis.

L4-L5:  Severe facet hypertrophy, very severe on the left.  Mild
circumferential disc osteophyte complex.  No significant spinal or
lateral recess stenosis.  Mild bilateral L4 foraminal stenosis.

L5-S1:  Moderate to severe facet hypertrophy greater on the right.
No spinal or lateral recess stenosis.  No significant disc bulge.
Mild if any bilateral L5 foraminal stenosis.
IMPRESSION: 1.  Scoliosis with posterior spinal rods and associated posterior
laminar ankylosis from the thoracic spine to L2.
2.  Severe facet degeneration from L2-L3 through L5-S1 is greater
on the left side except at L5-S1.  Consider this as a source of
left greater than right low back pain.
3.  There is multifactorial mild to moderate spinal and right
lateral recess stenosis at L3-L4.

## 2011-06-11 MED ORDER — HYDROCODONE-ACETAMINOPHEN 5-325 MG PO TABS
ORAL_TABLET | ORAL | Status: AC
  Filled 2011-06-11: qty 1

## 2011-06-11 MED ORDER — ONDANSETRON HCL 4 MG/2ML IJ SOLN: 4.0000 mg | Freq: Four times a day (QID) | INTRAMUSCULAR | Status: DC | PRN

## 2011-06-11 MED ORDER — DIAZEPAM 5 MG PO TABS: 10.0000 mg | ORAL_TABLET | Freq: Once | ORAL | Status: DC

## 2011-06-11 MED ORDER — DIAZEPAM 5 MG PO TABS
ORAL_TABLET | ORAL | Status: AC
  Administered 2011-06-11: 10 mg
  Filled 2011-06-11: qty 2

## 2011-06-11 MED ORDER — IOHEXOL 180 MG/ML  SOLN
11.0000 mL | Freq: Once | INTRAMUSCULAR | Status: AC | PRN
  Administered 2011-06-11: 11 mL via INTRATHECAL

## 2011-06-11 MED ORDER — HYDROCODONE-ACETAMINOPHEN 5-325 MG PO TABS
1.0000 | ORAL_TABLET | ORAL | Status: DC | PRN
  Administered 2011-06-11: 1 via ORAL

## 2011-06-11 NOTE — Discharge Instructions (Signed)
Myelogram and Lumbar Puncture Discharge Instructions ° °1. Go home and rest quietly for the next 24 hours.  It is important to lie flat for the next 24 hours.  Get up only to go to the restroom.  You may lie in the bed or on a couch on your back, your stomach, your left side or your right side.  You may have one pillow under your head.  You may have pillows between your knees while you are on your side or under your knees while you are on your back. ° °2. DO NOT drive today.  Recline the seat as far back as it will go, while still wearing your seat belt, on the way home. ° °3. You may get up to go to the bathroom as needed.  You may sit up for 10 minutes to eat.  You may resume your normal diet and medications unless otherwise indicated. ° °4. The incidence of headache, nausea, or vomiting is about 5% (one in 20 patients).  If you develop a headache, lie flat and drink plenty of fluids until the headache goes away.  Caffeinated beverages may be helpful.  If you develop severe nausea and vomiting or a headache that does not go away with flat bed rest, call . ° °5. You may resume normal activities after your 24 hours of bed rest is over; however, do not exert yourself strongly or do any heavy lifting tomorrow. ° °6. Call your physician for a follow-up appointment.  The results of your myelogram will be sent directly to your physician by the following day. ° °7. If you have any questions or if complications develop after you arrive home, please call  ° °Discharge instructions have been explained to the patient.  The patient, or the person responsible for the patient, fully understands these instructions. ° ° °
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
## 2011-06-12 ENCOUNTER — Telehealth (HOSPITAL_COMMUNITY): Payer: Self-pay

## 2011-06-25 ENCOUNTER — Other Ambulatory Visit: Payer: Self-pay | Admitting: Neurological Surgery

## 2011-08-28 ENCOUNTER — Encounter (HOSPITAL_COMMUNITY): Payer: Self-pay | Admitting: Pharmacy Technician

## 2011-09-09 ENCOUNTER — Encounter (HOSPITAL_COMMUNITY): Payer: Self-pay

## 2011-09-09 ENCOUNTER — Encounter (HOSPITAL_COMMUNITY)
Admission: RE | Admit: 2011-09-09 | Discharge: 2011-09-09 | Disposition: A | Discharge status: Home / Self Care | Payer: Medicaid Other | Source: Ambulatory Visit | Attending: Neurological Surgery | Admitting: Neurological Surgery

## 2011-09-09 HISTORY — DX: Major depressive disorder, single episode, unspecified: F32.9

## 2011-09-09 HISTORY — DX: Depression, unspecified: F32.A

## 2011-09-09 LAB — CBC
Hemoglobin: 11.8 g/dL — ABNORMAL LOW (ref 12.0–15.0)
MCH: 29.9 pg (ref 26.0–34.0)
MCHC: 33.1 g/dL (ref 30.0–36.0)
Platelets: 145 10*3/uL — ABNORMAL LOW (ref 150–400)
RDW: 12.9 % (ref 11.5–15.5)

## 2011-09-09 LAB — ABO/RH: ABO/RH(D): A POS

## 2011-09-09 LAB — HCG, SERUM, QUALITATIVE: Preg, Serum: NEGATIVE

## 2011-09-09 LAB — SURGICAL PCR SCREEN: MRSA, PCR: NEGATIVE

## 2011-09-09 NOTE — Pre-Procedure Instructions (Signed)
20 JOSS FRIEDEL  09/09/2011   Your procedure is scheduled on:  Fri, July 19 @ 7:30 AM  Report to [facility] at 5:30 AM.  Call this number if you have problems the morning of surgery: [phone]   Remember:   Do not eat food:After Midnight.  Take these medicines the morning of surgery with A SIP OF WATER: Zoloft(Sertraline) and Pain Pill(if needed)   Do not wear jewelry, make-up or nail polish.  Do not wear lotions, powders, or perfumes.   Do not shave 48 hours prior to surgery.   Do not bring valuables to the hospital.  Contacts, dentures or bridgework may not be worn into surgery.  Leave suitcase in the car. After surgery it may be brought to your room.  For patients admitted to the hospital, checkout time is 11:00 AM the day of discharge.   Patients discharged the day of surgery will not be allowed to drive home.  Special Instructions: CHG Shower Use Special Wash: 1/2 bottle night before surgery and 1/2 bottle morning of surgery.   Please read over the following fact sheets that you were given: Pain Booklet, Coughing and Deep Breathing, Blood Transfusion Information, MRSA Information and Surgical Site Infection Prevention

## 2011-09-12 MED ORDER — CEFAZOLIN SODIUM-DEXTROSE 2-3 GM-% IV SOLR
2.0000 g | INTRAVENOUS | Status: AC
  Administered 2011-09-13: 2 g via INTRAVENOUS
  Filled 2011-09-12: qty 50

## 2011-09-13 ENCOUNTER — Encounter (HOSPITAL_COMMUNITY)
Admission: RE | Disposition: A | Discharge status: Home / Self Care | Payer: Self-pay | Source: Ambulatory Visit | Attending: Neurological Surgery

## 2011-09-13 ENCOUNTER — Ambulatory Visit (HOSPITAL_COMMUNITY): Payer: Medicaid Other

## 2011-09-13 ENCOUNTER — Encounter (HOSPITAL_COMMUNITY): Payer: Self-pay | Admitting: *Deleted

## 2011-09-13 ENCOUNTER — Ambulatory Visit (HOSPITAL_COMMUNITY): Payer: Medicaid Other | Admitting: *Deleted

## 2011-09-13 ENCOUNTER — Inpatient Hospital Stay (HOSPITAL_COMMUNITY)
Admission: RE | Admit: 2011-09-13 | Discharge: 2011-09-17 | DRG: 460 | Disposition: A | Discharge status: Home / Self Care | Payer: Medicaid Other | Source: Ambulatory Visit | Attending: Neurological Surgery | Admitting: Neurological Surgery

## 2011-09-13 DIAGNOSIS — K5900 Constipation, unspecified: Secondary | ICD-10-CM | POA: Diagnosis not present

## 2011-09-13 DIAGNOSIS — K560 Paralytic ileus: Secondary | ICD-10-CM | POA: Diagnosis not present

## 2011-09-13 DIAGNOSIS — M41116 Juvenile idiopathic scoliosis, lumbar region: Secondary | ICD-10-CM

## 2011-09-13 DIAGNOSIS — M48061 Spinal stenosis, lumbar region without neurogenic claudication: Secondary | ICD-10-CM

## 2011-09-13 DIAGNOSIS — M47817 Spondylosis without myelopathy or radiculopathy, lumbosacral region: Principal | ICD-10-CM | POA: Diagnosis present

## 2011-09-13 DIAGNOSIS — M4120 Other idiopathic scoliosis, site unspecified: Secondary | ICD-10-CM | POA: Diagnosis present

## 2011-09-13 DIAGNOSIS — K929 Disease of digestive system, unspecified: Secondary | ICD-10-CM | POA: Diagnosis not present

## 2011-09-13 DIAGNOSIS — Y831 Surgical operation with implant of artificial internal device as the cause of abnormal reaction of the patient, or of later complication, without mention of misadventure at the time of the procedure: Secondary | ICD-10-CM | POA: Diagnosis not present

## 2011-09-13 DIAGNOSIS — M47812 Spondylosis without myelopathy or radiculopathy, cervical region: Secondary | ICD-10-CM | POA: Diagnosis present

## 2011-09-13 IMAGING — CR DG LUMBAR SPINE 2-3V
1 series · 1 of 1 positions shown · non-contrast
Comparison: Post-myelogram CT scan [DATE].

CLINICAL DATA: Laminectomy and fusion.

LUMBAR SPINE - 2-3 VIEW

[view not recorded]
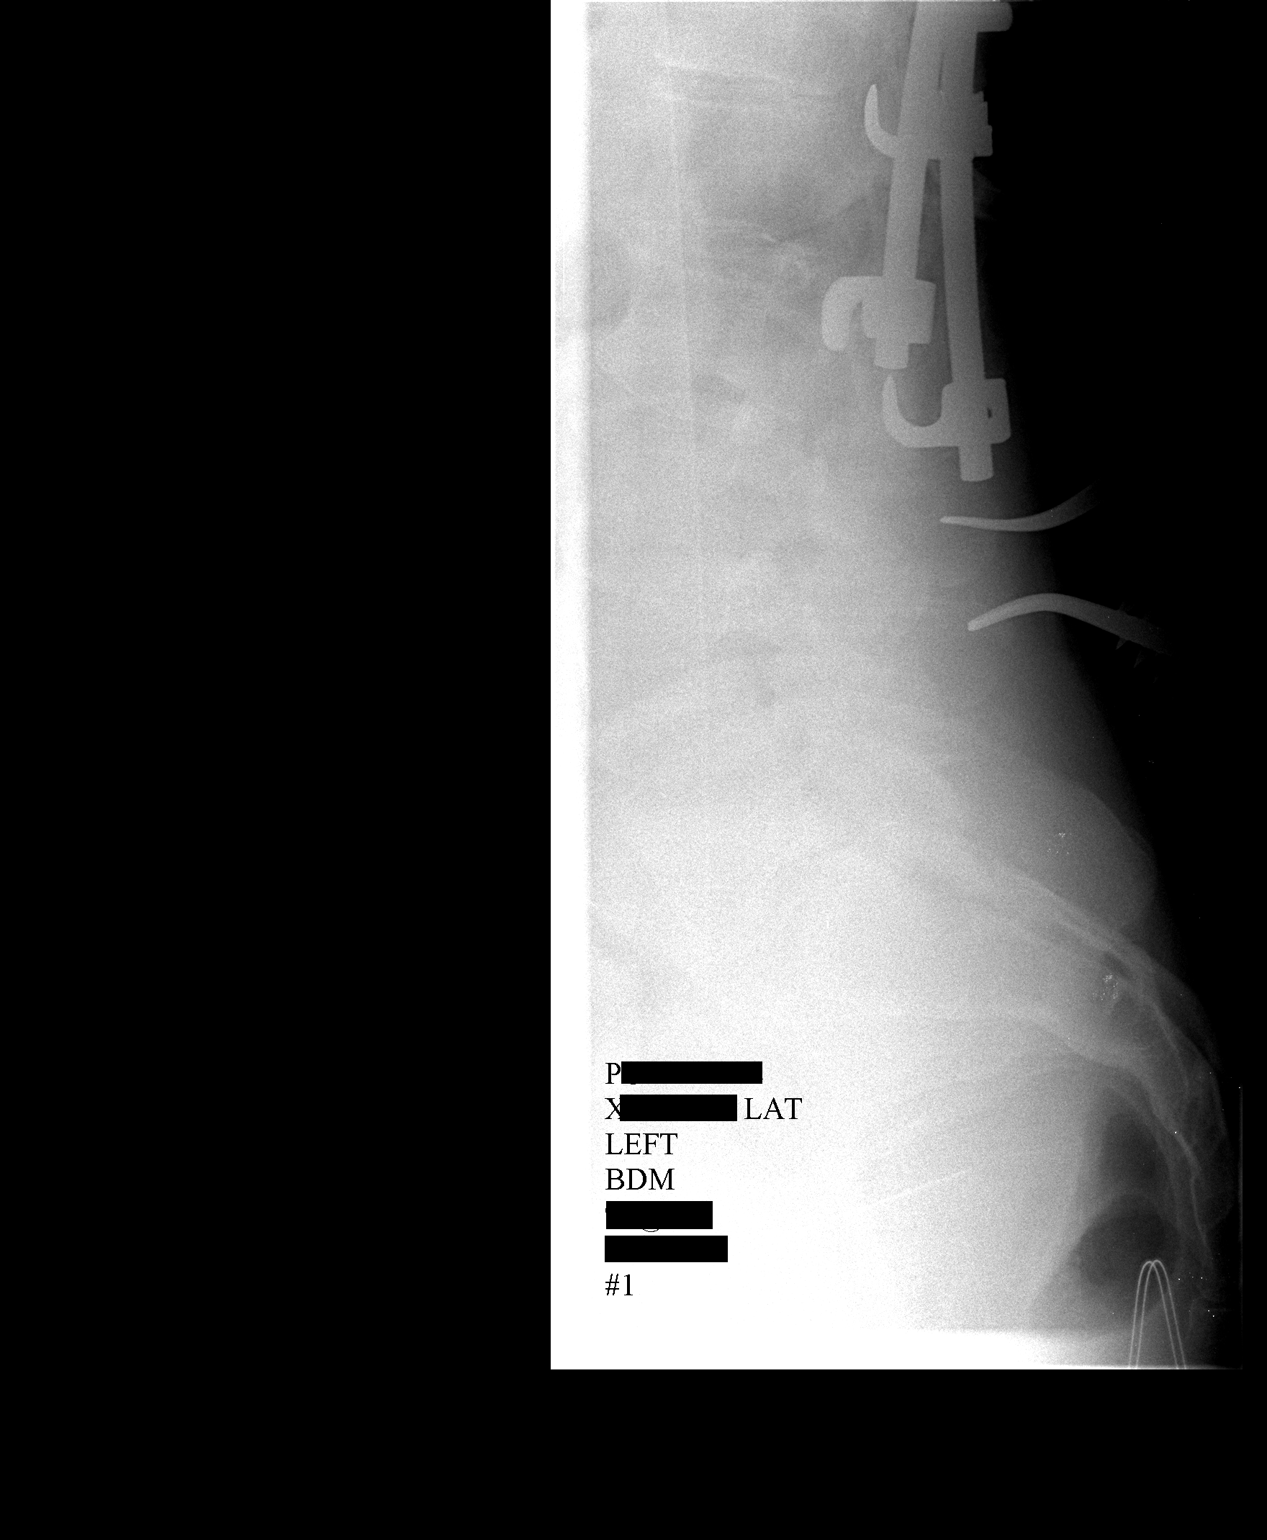

[1 of 1 positions shown; findings below may reference images not displayed]

FINDINGS: We are provided with two intraoperative views of the
lumbar spine in the lateral projection.  Images demonstrate
localization of L4-5.
IMPRESSION: L4-5 localization.

## 2011-09-13 IMAGING — CR DG LUMBAR SPINE 2-3V
1 series · 1 of 1 positions shown · non-contrast
Comparison: Post-myelogram CT scan [DATE].

CLINICAL DATA: Laminectomy and fusion.

LUMBAR SPINE - 2-3 VIEW

[view not recorded]
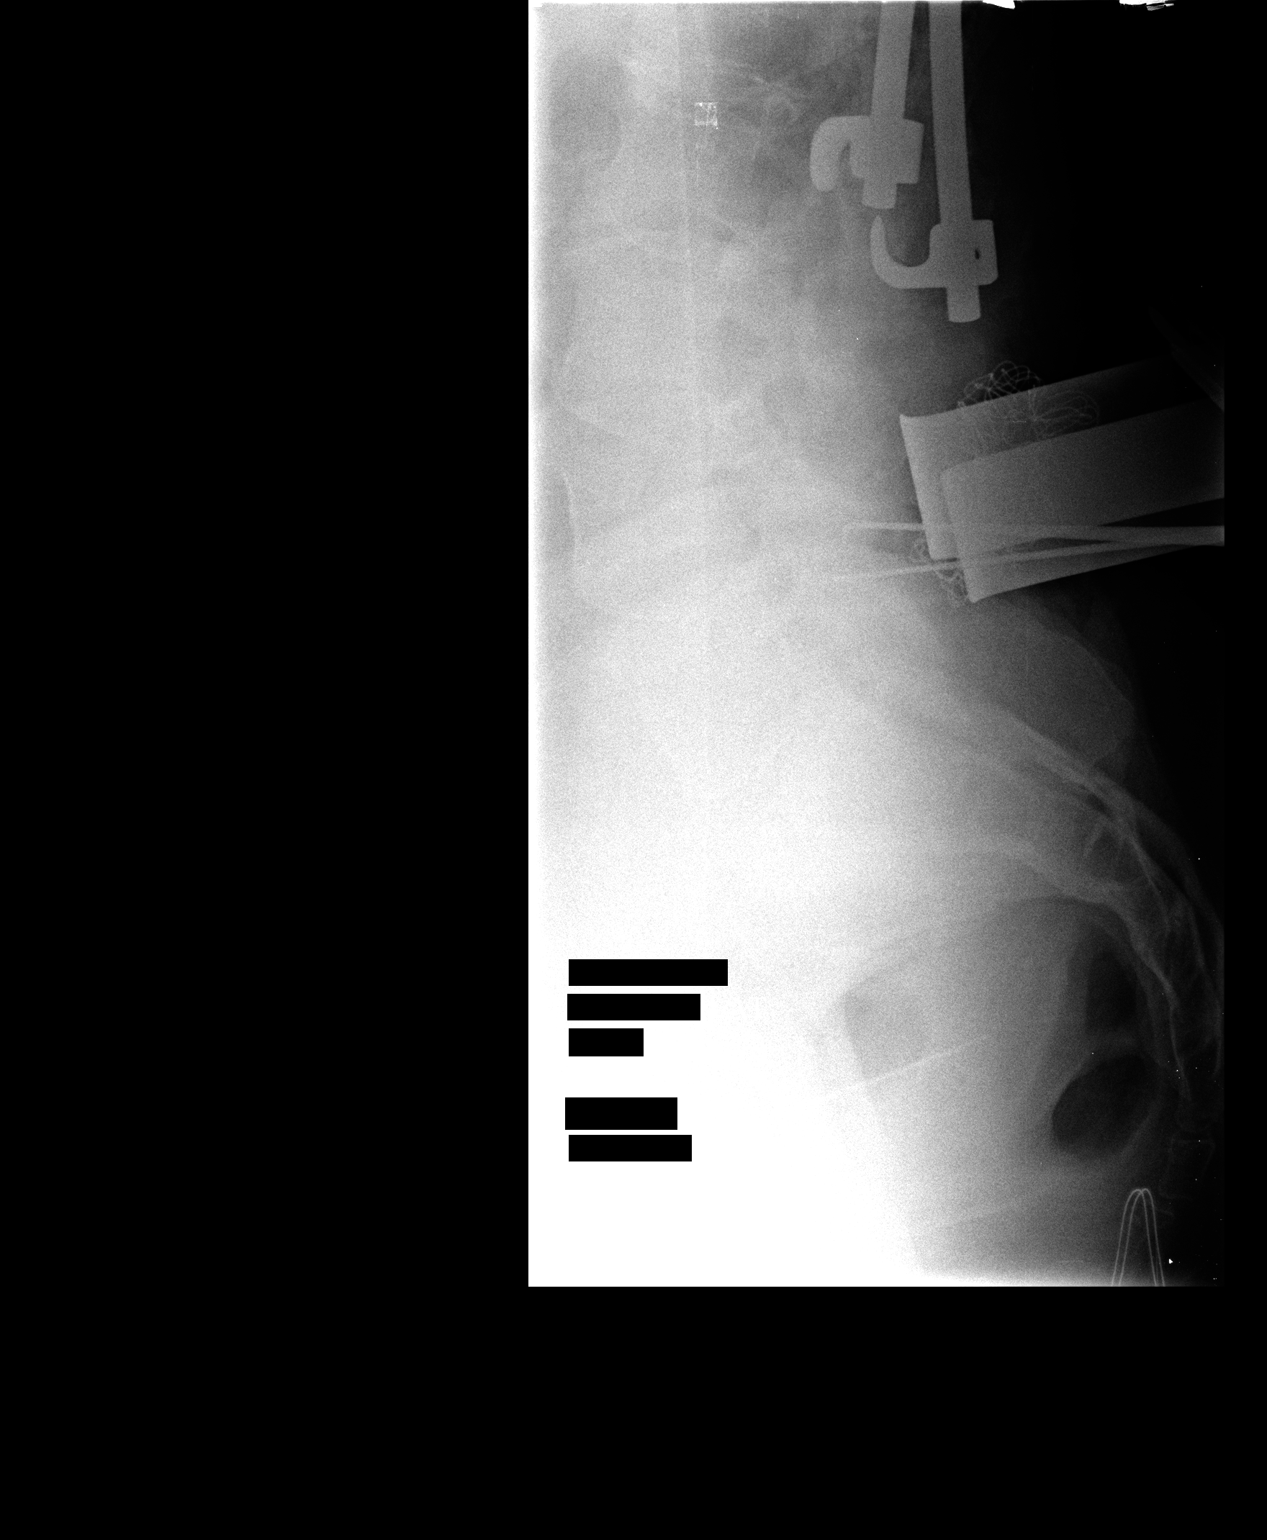

[1 of 1 positions shown; findings below may reference images not displayed]

FINDINGS: We are provided with two intraoperative views of the
lumbar spine in the lateral projection.  Images demonstrate
localization of L4-5.
IMPRESSION: L4-5 localization.

## 2011-09-13 IMAGING — RF DG C-ARM 61-120 MIN
1 series · 2 of 2 positions shown · non-contrast
Comparison: [DATE]

CLINICAL DATA: Spinal stenosis

LUMBAR SPINE TWO VIEW

[Series 1: run · 2 of 2 slices shown]
[im 1/2]
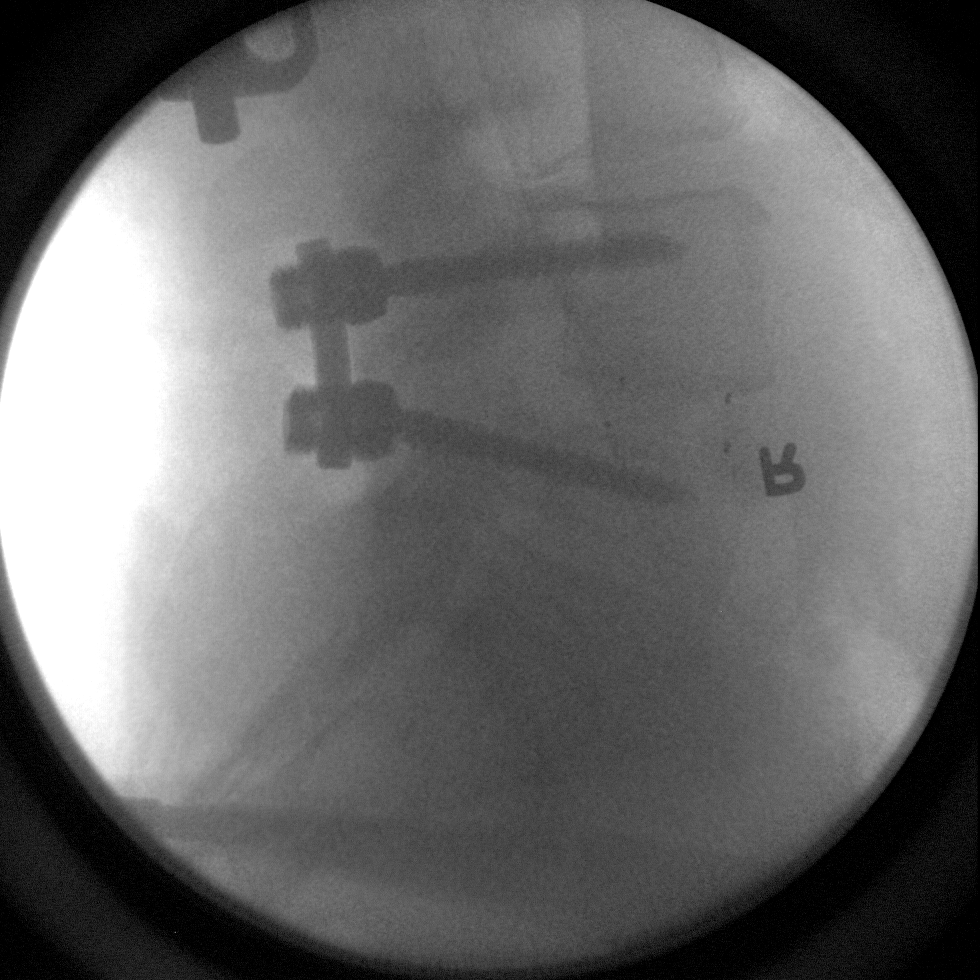
[im 2/2]
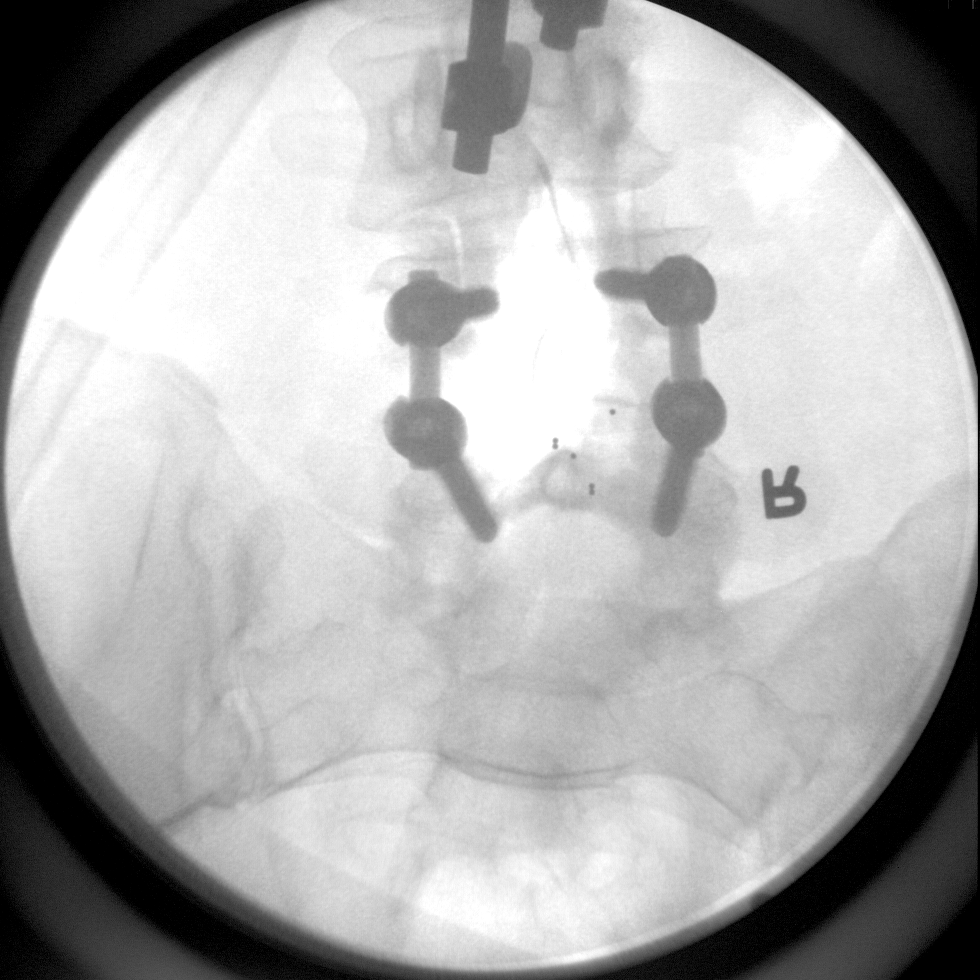

[2 of 2 positions shown; findings below may reference images not displayed]

FINDINGS: Two spot images from intraoperative C-arm fluoroscopy
document changes of L4-5 PLIF.  Bilateral pedicle screws at each
level with vertical interconnecting rods.  Graft markers project in
the interspace.  Harrington rods are partially seen over the upper
lumbar spine.
IMPRESSION:

## 2011-09-13 SURGERY — POSTERIOR LUMBAR FUSION 1 LEVEL
Anesthesia: General | Site: Spine Lumbar

## 2011-09-13 MED ORDER — FENTANYL CITRATE 0.05 MG/ML IJ SOLN
INTRAMUSCULAR | Status: DC | PRN
  Administered 2011-09-13: 50 ug via INTRAVENOUS
  Administered 2011-09-13: 100 ug via INTRAVENOUS
  Administered 2011-09-13 (×2): 50 ug via INTRAVENOUS

## 2011-09-13 MED ORDER — PHENOL 1.4 % MT LIQD: 1.0000 | OROMUCOSAL | Status: DC | PRN

## 2011-09-13 MED ORDER — ALBUMIN HUMAN 5 % IV SOLN
INTRAVENOUS | Status: DC | PRN
  Administered 2011-09-13: 10:00:00 via INTRAVENOUS

## 2011-09-13 MED ORDER — MORPHINE SULFATE 10 MG/ML IJ SOLN
INTRAMUSCULAR | Status: DC | PRN
  Administered 2011-09-13 (×2): 4 mg via INTRAVENOUS
  Administered 2011-09-13: 2 mg via INTRAVENOUS

## 2011-09-13 MED ORDER — ROCURONIUM BROMIDE 100 MG/10ML IV SOLN
INTRAVENOUS | Status: DC | PRN
  Administered 2011-09-13: 50 mg via INTRAVENOUS

## 2011-09-13 MED ORDER — MORPHINE SULFATE 2 MG/ML IJ SOLN
1.0000 mg | INTRAMUSCULAR | Status: DC | PRN
  Administered 2011-09-13: 2 mg via INTRAVENOUS
  Administered 2011-09-13: 4 mg via INTRAVENOUS
  Filled 2011-09-13: qty 2
  Filled 2011-09-13: qty 1

## 2011-09-13 MED ORDER — ONDANSETRON HCL 4 MG/2ML IJ SOLN: 4.0000 mg | Freq: Once | INTRAMUSCULAR | Status: DC | PRN

## 2011-09-13 MED ORDER — ONDANSETRON HCL 4 MG/2ML IJ SOLN
4.0000 mg | INTRAMUSCULAR | Status: DC | PRN
  Administered 2011-09-13: 4 mg via INTRAVENOUS
  Filled 2011-09-13: qty 2

## 2011-09-13 MED ORDER — EPHEDRINE SULFATE 50 MG/ML IJ SOLN
INTRAMUSCULAR | Status: DC | PRN
  Administered 2011-09-13: 10 mg via INTRAVENOUS
  Administered 2011-09-13 (×7): 5 mg via INTRAVENOUS

## 2011-09-13 MED ORDER — LISDEXAMFETAMINE DIMESYLATE 50 MG PO CAPS: 50.0000 mg | ORAL_CAPSULE | Freq: Every day | ORAL | Status: DC

## 2011-09-13 MED ORDER — THROMBIN 20000 UNITS EX KIT
PACK | CUTANEOUS | Status: DC | PRN
  Administered 2011-09-13: 10:00:00 via TOPICAL

## 2011-09-13 MED ORDER — SODIUM CHLORIDE 0.9 % IV SOLN
INTRAVENOUS | Status: AC
  Filled 2011-09-13: qty 500

## 2011-09-13 MED ORDER — ONDANSETRON HCL 4 MG/2ML IJ SOLN
INTRAMUSCULAR | Status: DC | PRN
  Administered 2011-09-13: 4 mg via INTRAVENOUS

## 2011-09-13 MED ORDER — BUPIVACAINE HCL (PF) 0.5 % IJ SOLN
INTRAMUSCULAR | Status: DC | PRN
  Administered 2011-09-13: 5 mL
  Administered 2011-09-13: 30 mL

## 2011-09-13 MED ORDER — OXYCODONE-ACETAMINOPHEN 5-325 MG PO TABS
1.0000 | ORAL_TABLET | ORAL | Status: DC | PRN
  Administered 2011-09-13 – 2011-09-17 (×11): 2 via ORAL
  Filled 2011-09-13 (×12): qty 2

## 2011-09-13 MED ORDER — DIAZEPAM 5 MG PO TABS
5.0000 mg | ORAL_TABLET | Freq: Four times a day (QID) | ORAL | Status: DC | PRN
  Administered 2011-09-14 – 2011-09-17 (×5): 5 mg via ORAL
  Filled 2011-09-13 (×7): qty 1

## 2011-09-13 MED ORDER — SODIUM CHLORIDE 0.9 % IJ SOLN: 3.0000 mL | INTRAMUSCULAR | Status: DC | PRN

## 2011-09-13 MED ORDER — BACITRACIN 50000 UNITS IM SOLR
INTRAMUSCULAR | Status: AC
  Filled 2011-09-13: qty 1

## 2011-09-13 MED ORDER — PROPOFOL 10 MG/ML IV EMUL
INTRAVENOUS | Status: DC | PRN
  Administered 2011-09-13: 20 mg via INTRAVENOUS
  Administered 2011-09-13: 10 mg via INTRAVENOUS
  Administered 2011-09-13: 120 mg via INTRAVENOUS

## 2011-09-13 MED ORDER — SODIUM CHLORIDE 0.9 % IJ SOLN
3.0000 mL | Freq: Two times a day (BID) | INTRAMUSCULAR | Status: DC
  Administered 2011-09-15: 3 mL via INTRAVENOUS

## 2011-09-13 MED ORDER — ARTIFICIAL TEARS OP OINT
TOPICAL_OINTMENT | OPHTHALMIC | Status: DC | PRN
  Administered 2011-09-13: 1 via OPHTHALMIC

## 2011-09-13 MED ORDER — HYDROMORPHONE HCL PF 1 MG/ML IJ SOLN
INTRAMUSCULAR | Status: AC
  Filled 2011-09-13: qty 1

## 2011-09-13 MED ORDER — GLYCOPYRROLATE 0.2 MG/ML IJ SOLN
INTRAMUSCULAR | Status: DC | PRN
  Administered 2011-09-13: .7 mg via INTRAVENOUS

## 2011-09-13 MED ORDER — ALUM & MAG HYDROXIDE-SIMETH 200-200-20 MG/5ML PO SUSP
30.0000 mL | Freq: Four times a day (QID) | ORAL | Status: DC | PRN
  Administered 2011-09-13: 30 mL via ORAL
  Filled 2011-09-13: qty 30

## 2011-09-13 MED ORDER — SERTRALINE HCL 100 MG PO TABS
100.0000 mg | ORAL_TABLET | Freq: Every day | ORAL | Status: DC
  Administered 2011-09-13 – 2011-09-16 (×4): 100 mg via ORAL
  Filled 2011-09-13 (×6): qty 1

## 2011-09-13 MED ORDER — DROPERIDOL 2.5 MG/ML IJ SOLN
INTRAMUSCULAR | Status: DC | PRN
  Administered 2011-09-13: 0.625 mg via INTRAVENOUS

## 2011-09-13 MED ORDER — 0.9 % SODIUM CHLORIDE (POUR BTL) OPTIME
TOPICAL | Status: DC | PRN
  Administered 2011-09-13: 1000 mL

## 2011-09-13 MED ORDER — HYDROMORPHONE HCL PF 1 MG/ML IJ SOLN
0.2500 mg | INTRAMUSCULAR | Status: DC | PRN
  Administered 2011-09-13 (×2): 0.5 mg via INTRAVENOUS

## 2011-09-13 MED ORDER — LIDOCAINE-EPINEPHRINE 1 %-1:100000 IJ SOLN
INTRAMUSCULAR | Status: DC | PRN
  Administered 2011-09-13: 5 mL

## 2011-09-13 MED ORDER — LIDOCAINE HCL (CARDIAC) 20 MG/ML IV SOLN
INTRAVENOUS | Status: DC | PRN
  Administered 2011-09-13: 100 mg via INTRAVENOUS

## 2011-09-13 MED ORDER — ACETAMINOPHEN 650 MG RE SUPP: 650.0000 mg | RECTAL | Status: DC | PRN

## 2011-09-13 MED ORDER — NEOSTIGMINE METHYLSULFATE 1 MG/ML IJ SOLN
INTRAMUSCULAR | Status: DC | PRN
  Administered 2011-09-13: 4 mg via INTRAVENOUS

## 2011-09-13 MED ORDER — MENTHOL 3 MG MT LOZG: 1.0000 | LOZENGE | OROMUCOSAL | Status: DC | PRN

## 2011-09-13 MED ORDER — SODIUM CHLORIDE 0.9 % IV SOLN: 250.0000 mL | INTRAVENOUS | Status: DC

## 2011-09-13 MED ORDER — CEFAZOLIN SODIUM 1-5 GM-% IV SOLN
1.0000 g | Freq: Three times a day (TID) | INTRAVENOUS | Status: AC
  Administered 2011-09-14 (×2): 1 g via INTRAVENOUS
  Filled 2011-09-13 (×3): qty 50

## 2011-09-13 MED ORDER — KETOROLAC TROMETHAMINE 15 MG/ML IJ SOLN
15.0000 mg | Freq: Four times a day (QID) | INTRAMUSCULAR | Status: AC | PRN
  Administered 2011-09-14 – 2011-09-15 (×2): 15 mg via INTRAVENOUS
  Filled 2011-09-13: qty 1

## 2011-09-13 MED ORDER — ACETAMINOPHEN 10 MG/ML IV SOLN
INTRAVENOUS | Status: DC | PRN
  Administered 2011-09-13: 1000 mg via INTRAVENOUS

## 2011-09-13 MED ORDER — LACTATED RINGERS IV SOLN
INTRAVENOUS | Status: DC | PRN
  Administered 2011-09-13 (×4): via INTRAVENOUS

## 2011-09-13 MED ORDER — ACETAMINOPHEN 325 MG PO TABS
650.0000 mg | ORAL_TABLET | ORAL | Status: DC | PRN
  Administered 2011-09-15 – 2011-09-16 (×2): 650 mg via ORAL
  Filled 2011-09-13 (×2): qty 2

## 2011-09-13 MED ORDER — VECURONIUM BROMIDE 10 MG IV SOLR
INTRAVENOUS | Status: DC | PRN
  Administered 2011-09-13: 1 mg via INTRAVENOUS
  Administered 2011-09-13: 3 mg via INTRAVENOUS
  Administered 2011-09-13 (×2): 2 mg via INTRAVENOUS

## 2011-09-13 MED ORDER — ACETAMINOPHEN 10 MG/ML IV SOLN
INTRAVENOUS | Status: AC
Start: 2011-09-13 — End: 2011-09-13
  Filled 2011-09-13: qty 100

## 2011-09-13 MED ORDER — PHENYLEPHRINE HCL 10 MG/ML IJ SOLN
INTRAMUSCULAR | Status: DC | PRN
  Administered 2011-09-13: 20 ug via INTRAVENOUS

## 2011-09-13 MED ORDER — SODIUM CHLORIDE 0.9 % IR SOLN
Status: DC | PRN
  Administered 2011-09-13: 10:00:00

## 2011-09-13 MED ORDER — MIDAZOLAM HCL 5 MG/5ML IJ SOLN
INTRAMUSCULAR | Status: DC | PRN
  Administered 2011-09-13 (×2): 2 mg via INTRAVENOUS

## 2011-09-13 SURGICAL SUPPLY — 65 items
ADH SKN CLS APL DERMABOND .7 (GAUZE/BANDAGES/DRESSINGS)
ADH SKN CLS LQ APL DERMABOND (GAUZE/BANDAGES/DRESSINGS) ×1
BAG DECANTER FOR FLEXI CONT (MISCELLANEOUS) ×2 IMPLANT
BLADE SURG ROTATE 9660 (MISCELLANEOUS) IMPLANT
BUR MATCHSTICK NEURO 3.0 LAGG (BURR) ×2 IMPLANT
CAGE 13MM (Cage) ×1 IMPLANT
CANISTER SUCTION 2500CC (MISCELLANEOUS) ×2 IMPLANT
CLOTH BEACON ORANGE TIMEOUT ST (SAFETY) ×2 IMPLANT
CONT SPEC 4OZ CLIKSEAL STRL BL (MISCELLANEOUS) ×4 IMPLANT
COVER BACK TABLE 24X17X13 BIG (DRAPES) IMPLANT
COVER TABLE BACK 60X90 (DRAPES) ×2 IMPLANT
DECANTER SPIKE VIAL GLASS SM (MISCELLANEOUS) ×2 IMPLANT
DERMABOND ADHESIVE PROPEN (GAUZE/BANDAGES/DRESSINGS) ×1
DERMABOND ADVANCED (GAUZE/BANDAGES/DRESSINGS)
DERMABOND ADVANCED .7 DNX12 (GAUZE/BANDAGES/DRESSINGS) ×1 IMPLANT
DERMABOND ADVANCED .7 DNX6 (GAUZE/BANDAGES/DRESSINGS) IMPLANT
DRAPE C-ARM 42X72 X-RAY (DRAPES) ×4 IMPLANT
DRAPE LAPAROTOMY 100X72X124 (DRAPES) ×2 IMPLANT
DRAPE POUCH INSTRU U-SHP 10X18 (DRAPES) ×2 IMPLANT
DRAPE PROXIMA HALF (DRAPES) IMPLANT
DURAPREP 26ML APPLICATOR (WOUND CARE) ×2 IMPLANT
ELECT REM PT RETURN 9FT ADLT (ELECTROSURGICAL) ×2
ELECTRODE REM PT RTRN 9FT ADLT (ELECTROSURGICAL) ×1 IMPLANT
GAUZE SPONGE 4X4 16PLY XRAY LF (GAUZE/BANDAGES/DRESSINGS) ×1 IMPLANT
GLOVE BIO SURGEON STRL SZ8.5 (GLOVE) ×1 IMPLANT
GLOVE BIOGEL PI IND STRL 7.5 (GLOVE) IMPLANT
GLOVE BIOGEL PI IND STRL 8.5 (GLOVE) ×2 IMPLANT
GLOVE BIOGEL PI INDICATOR 7.5 (GLOVE) ×1
GLOVE BIOGEL PI INDICATOR 8.5 (GLOVE) ×5
GLOVE ECLIPSE 7.5 STRL STRAW (GLOVE) ×1 IMPLANT
GLOVE ECLIPSE 8.5 STRL (GLOVE) ×5 IMPLANT
GLOVE EXAM NITRILE LRG STRL (GLOVE) IMPLANT
GLOVE EXAM NITRILE MD LF STRL (GLOVE) ×1 IMPLANT
GLOVE EXAM NITRILE XL STR (GLOVE) IMPLANT
GLOVE EXAM NITRILE XS STR PU (GLOVE) IMPLANT
GLOVE SS BIOGEL STRL SZ 8 (GLOVE) IMPLANT
GLOVE SUPERSENSE BIOGEL SZ 8 (GLOVE) ×1
GLOVE SURG SS PI 8.0 STRL IVOR (GLOVE) ×4 IMPLANT
GOWN BRE IMP SLV AUR LG STRL (GOWN DISPOSABLE) ×1 IMPLANT
GOWN BRE IMP SLV AUR XL STRL (GOWN DISPOSABLE) ×1 IMPLANT
GOWN STRL REIN 2XL LVL4 (GOWN DISPOSABLE) ×7 IMPLANT
KIT BASIN OR (CUSTOM PROCEDURE TRAY) ×2 IMPLANT
KIT ROOM TURNOVER OR (KITS) ×2 IMPLANT
NEEDLE HYPO 22GX1.5 SAFETY (NEEDLE) ×2 IMPLANT
NS IRRIG 1000ML POUR BTL (IV SOLUTION) ×2 IMPLANT
PACK FOAM VITOSS 10CC (Orthopedic Implant) ×1 IMPLANT
PACK LAMINECTOMY NEURO (CUSTOM PROCEDURE TRAY) ×2 IMPLANT
PAD ARMBOARD 7.5X6 YLW CONV (MISCELLANEOUS) ×8 IMPLANT
PATTIES SURGICAL .5 X1 (DISPOSABLE) ×2 IMPLANT
ROD 35MM (Rod) ×2 IMPLANT
SCREW POLYAX 6.5X45MM (Screw) ×4 IMPLANT
SCREW SET SPINAL STD HEXALOBE (Screw) ×4 IMPLANT
SPONGE GAUZE 4X4 12PLY (GAUZE/BANDAGES/DRESSINGS) ×2 IMPLANT
SPONGE LAP 4X18 X RAY DECT (DISPOSABLE) IMPLANT
SPONGE SURGIFOAM ABS GEL 100 (HEMOSTASIS) ×2 IMPLANT
SUT VIC AB 1 CT1 18XBRD ANBCTR (SUTURE) ×1 IMPLANT
SUT VIC AB 1 CT1 8-18 (SUTURE) ×4
SUT VIC AB 2-0 CP2 18 (SUTURE) ×3 IMPLANT
SUT VIC AB 3-0 SH 8-18 (SUTURE) ×3 IMPLANT
SYR 20ML ECCENTRIC (SYRINGE) ×2 IMPLANT
TOWEL OR 17X24 6PK STRL BLUE (TOWEL DISPOSABLE) ×2 IMPLANT
TOWEL OR 17X26 10 PK STRL BLUE (TOWEL DISPOSABLE) ×2 IMPLANT
TRAP SPECIMEN MUCOUS 40CC (MISCELLANEOUS) ×2 IMPLANT
TRAY FOLEY CATH 14FRSI W/METER (CATHETERS) ×2 IMPLANT
WATER STERILE IRR 1000ML POUR (IV SOLUTION) ×2 IMPLANT

## 2011-09-13 NOTE — Preoperative (Signed)
Beta Blockers   Reason not to administer Beta Blockers:Not Applicable 

## 2011-09-13 NOTE — Progress Notes (Signed)
Orthopedic Tech Progress Note Patient Details:  Katie Ramirez [birth year] [id number] Called bio-tech vendor for brace talked to Tammy Sours at 6:15 pm. Patient ID: Katie Ramirez, female   DOB: [birth year], 31 y.o.   MRN: [id number]   Katie Ramirez 09/13/2011, 6:15 PM

## 2011-09-13 NOTE — Progress Notes (Signed)
Patient ID: Katie Ramirez, female   DOB: Oct 19, 1980, 31 y.o.   MRN: [id number] Vital signs are stable. Patient offers no complaints feels comfortable regarding back  Incisions clean and dry. Odor function is good in lower extremities in iliopsoas quadricep tibialis anterior and gastrocs.  Plan mobilize patient in a.m. with brace on. Indicated that Dr. Lovell Sheehan on call this weekend and patient will likely go home on Monday.

## 2011-09-13 NOTE — Op Note (Signed)
Procedure: L4-L5 decompressive laminectomy decompression of L4 and L5 nerve roots with work greater than require for simple posterior interbody arthrodesis, posterior lumbar interbody arthrodesis with peek spacers local autograft and allograft, pedicle screw fixation L4-L5, posterior lateral arthrodesis L4-L5   Preoperative diagnosis: Lumbar scoliosis status post Harrington rod fusion from T7-L2 with left L4 radiculopathy, stenosis L4-L5  Postoperative diagnosis: Same Surgeon: Barnett Abu M.D.  Asst.: Tressie Stalker M.D.  Indications: Patient is Katie Ramirez is a 31 y.o. female who who's had significant back pain and lumbar radiculopathy for over a years period time. A lumbar myelogram demonstrates scoliosis with a Harrington rod fusion down to the level of L2 she has advanced spondylitic changes at L4-L5 with foraminal stenosis for the left L4 nerve root area she has moderate facet hypertrophy at L3-L4 but without canal or nerve root compromise. She's been advised regarding decompression and arthrodesis at the level of L4-L5 to alleviate her L4 radicular symptoms on the left side that have been unresponsive to conservative management.  Procedure: The patient was brought to the operating room supine on a stretcher. After the smooth induction of general endotracheal anesthesia she was turned prone and the back was prepped with alcohol and DuraPrep. The back was then draped sterilely. A midline incision was created and carried down to the lumbar dorsal fascia. A portion of her previous scar was excised and at the superior most portion of the incision.  A localizing radiograph identified the L4 and L5 spinous processes. A subligamentous dissection was created at L4 and L5 to expose the interlaminar space at L4 and L5 and the facet joints over the L4-L5 interspace. Laminotomies were were then created removing the entire inferior margin of the lamina of L4 including the inferior facet at the L4-L5  joint. This was performed on the left side only. The L4 nerve root was decompressed in its travel out the foramen from a significant overgrowth of the superior articular process of the L5 facet on the left. The disc space was then isolated area The yellow ligament was taken up and the common dural tube was exposed along with the L4 nerve root superiorly, and the L5 nerve root inferiorly, the disc space was exposed and epidural veins in this region were cauterized and divided. The L4 nerve roots and the L5 nerve root were dissected with care taken to protect them. The disc space was opened and a combination of curettes and rongeurs was used to evacuate the disc space fully. The endplates were removed using sharp curettes. An interbody spacer was placed to distract the disc space while the contralateral discectomy was performed. When the entirety of the disc was removed and the endplates were prepared final sizing of the disc space was obtained 13 mm peek spacers were chosen and packed with autograft and allograft and placed into the interspace. The remainder of the interspace was packed with autograft and allograft. Pedicle entry sites were then chosen using fluoroscopic guidance and 5.5 x 30 mm screws were placed in L4 and 6.5 x 40 mm screws were placed in L5. The lateral gutters were decorticated and graft was packed in the posterolateral gutters between L4 and L5. Final radiographs were obtained after placing appropriately sized rods between the pedicle screws at L4-L5 and torquing these to the appropriate tension. The surgical site was inspected carefully to assure the L4 and L5 nerve roots were well decompressed, hemostasis was obtained, and the graft was well packed. Then the retractors were removed and  the wound was closed with #1 Vicryl in the lumbar dorsal fascia 2-0 Vicryl in the subcutaneous tissue and 3-0 Vicryl subcuticularly. When he cc of half percent Marcaine was injected into the paraspinous  musculature at the time of closure. Blood loss was estimated at 500 cc. The patient tolerated procedure well and was returned to the recovery room in stable condition.
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
## 2011-09-13 NOTE — Anesthesia Postprocedure Evaluation (Signed)
Anesthesia Post Note  Patient: Katie Ramirez  Procedure(s) Performed: Procedure(s) (LRB): POSTERIOR LUMBAR FUSION 1 LEVEL (N/A)  Anesthesia type: general  Patient location: PACU  Post pain: Pain level controlled  Post assessment: Patient's Cardiovascular Status Stable  Last Vitals:  Filed Vitals:   09/13/11 1300  BP: 99/52  Pulse: 64  Temp:   Resp: 20    Post vital signs: Reviewed and stable  Level of consciousness: sedated  Complications: No apparent anesthesia complications

## 2011-09-13 NOTE — Transfer of Care (Signed)
Immediate Anesthesia Transfer of Care Note  Patient: Katie Ramirez  Procedure(s) Performed: Procedure(s) (LRB): POSTERIOR LUMBAR FUSION 1 LEVEL (N/A)  Patient Location: PACU  Anesthesia Type: General  Level of Consciousness: patient cooperative, lethargic and responds to stimulation  Airway & Oxygen Therapy: Patient Spontanous Breathing and Patient connected to face mask oxygen  Post-op Assessment: Report given to PACU RN and Post -op Vital signs reviewed and stable  Post vital signs: Reviewed and stable  Complications: No apparent anesthesia complications

## 2011-09-13 NOTE — Anesthesia Preprocedure Evaluation (Addendum)
Anesthesia Evaluation  Patient identified by MRN, date of birth, ID band Patient awake    Reviewed: Allergy & Precautions, H&P , NPO status , Patient's Chart, lab work & pertinent test results  Airway Mallampati: II TM Distance: >3 FB Neck ROM: Full    Dental  (+) Teeth Intact and Dental Advisory Given   Pulmonary neg pulmonary ROS,  breath sounds clear to auscultation        Cardiovascular negative cardio ROS  Rhythm:Regular Rate:Normal     Neuro/Psych negative neurological ROS     GI/Hepatic negative GI ROS, Neg liver ROS,   Endo/Other  negative endocrine ROS  Renal/GU negative Renal ROS     Musculoskeletal negative musculoskeletal ROS (+)   Abdominal Normal abdominal exam  (+)   Peds  Hematology negative hematology ROS (+)   Anesthesia Other Findings   Reproductive/Obstetrics negative OB ROS                           Anesthesia Physical Anesthesia Plan  ASA: II  Anesthesia Plan: General   Post-op Pain Management:    Induction: Intravenous  Airway Management Planned: Oral ETT  Additional Equipment:   Intra-op Plan:   Post-operative Plan: Extubation in OR  Informed Consent: I have reviewed the patients History and Physical, chart, labs and discussed the procedure including the risks, benefits and alternatives for the proposed anesthesia with the patient or authorized representative who has indicated his/her understanding and acceptance.   Dental advisory given  Plan Discussed with: Surgeon and CRNA  Anesthesia Plan Comments:        Anesthesia Quick Evaluation

## 2011-09-14 ENCOUNTER — Encounter (HOSPITAL_COMMUNITY): Payer: Self-pay | Admitting: *Deleted

## 2011-09-14 MED ORDER — MORPHINE SULFATE (PF) 1 MG/ML IV SOLN
INTRAVENOUS | Status: DC
  Administered 2011-09-14: 6 mg via INTRAVENOUS
  Administered 2011-09-14: 25 mg via INTRAVENOUS
  Administered 2011-09-14: 09:00:00 via INTRAVENOUS
  Administered 2011-09-14: 25 mg via INTRAVENOUS
  Administered 2011-09-14: 10.5 mg via INTRAVENOUS
  Administered 2011-09-14: 22.5 mg via INTRAVENOUS
  Administered 2011-09-15: 07:00:00 via INTRAVENOUS
  Administered 2011-09-15: 16.5 mg via INTRAVENOUS
  Filled 2011-09-14 (×5): qty 25

## 2011-09-14 MED ORDER — DIPHENHYDRAMINE HCL 50 MG/ML IJ SOLN: 12.5000 mg | Freq: Four times a day (QID) | INTRAMUSCULAR | Status: DC | PRN

## 2011-09-14 MED ORDER — SODIUM CHLORIDE 0.9 % IJ SOLN: 9.0000 mL | INTRAMUSCULAR | Status: DC | PRN

## 2011-09-14 MED ORDER — NALOXONE HCL 0.4 MG/ML IJ SOLN: 0.4000 mg | INTRAMUSCULAR | Status: DC | PRN

## 2011-09-14 MED ORDER — DIPHENHYDRAMINE HCL 12.5 MG/5ML PO ELIX
12.5000 mg | ORAL_SOLUTION | Freq: Four times a day (QID) | ORAL | Status: DC | PRN
  Administered 2011-09-14: 16:00:00 via ORAL
  Administered 2011-09-14: 12.5 mg via ORAL
  Filled 2011-09-14 (×2): qty 10

## 2011-09-14 MED ORDER — ONDANSETRON HCL 4 MG/2ML IJ SOLN
4.0000 mg | Freq: Four times a day (QID) | INTRAMUSCULAR | Status: DC | PRN
  Administered 2011-09-15: 4 mg via INTRAVENOUS
  Filled 2011-09-14: qty 2

## 2011-09-14 NOTE — Progress Notes (Signed)
Patient ID: Katie Ramirez, female   DOB: 1981-01-02, 31 y.o.   MRN: [id number] Subjective:  The patient is alert and pleasant. Her back is appropriately sore.  Objective: Vital signs in last 24 hours: Temp:  [97.2 F (36.2 C)-98.6 F (37 C)] 98.2 F (36.8 C) (07/20 0911) Pulse Rate:  [52-88] 69  (07/20 0911) Resp:  [11-29] 14  (07/20 0921) BP: (90-120)/(44-72) 101/52 mmHg (07/20 0911) SpO2:  [92 %-100 %] 94 % (07/20 0921) Weight:  [92.08 kg (203 lb)] 92.08 kg (203 lb) (07/20 0603)  Intake/Output from previous day: 07/19 0701 - 07/20 0700 In: 3960 [P.O.:240; I.V.:3300; Blood:170; IV Piggyback:250] Out: 1510 [Urine:1010; Blood:500] Intake/Output this shift: Total I/O In: 240 [P.O.:240] Out: -   Physical exam patient is alert and oriented. Her strength is grossly normal in her lower extremities. Her wound is healing well without discharge.  Lab Results: No results found for this basename: WBC:2,HGB:2,HCT:2,PLT:2 in the last 72 hours BMET No results found for this basename: NA:2,K:2,CL:2,CO2:2,GLUCOSE:2,BUN:2,CREATININE:2,CALCIUM:2 in the last 72 hours  Studies/Results: Dg Lumbar Spine 2-3 Views  09/13/2011  *RADIOLOGY REPORT*  Clinical data:  Spinal stenosis  LUMBAR SPINE TWO VIEW  Comparison:  06/11/2011  Findings:  Two spot images from intraoperative C-arm fluoroscopy document changes of L4-5 PLIF.  Bilateral pedicle screws at each level with vertical interconnecting rods.  Graft markers project in the interspace.  Harrington rods are partially seen over the upper lumbar spine.  IMPRESSION: 1.  P L I F L4-5  Original Report Authenticated By: Osa Craver, M.D.   Dg Lumbar Spine 2-3 Views  09/13/2011  *RADIOLOGY REPORT*  Clinical Data: Laminectomy and fusion.  LUMBAR SPINE - 2-3 VIEW  Comparison: Post-myelogram CT scan 06/11/2011.  Findings: We are provided with two intraoperative views of the lumbar spine in the lateral projection.  Images demonstrate localization of  L4-5.  IMPRESSION: L4-5 localization.  Original Report Authenticated By: Bernadene Bell. D'ALESSIO, M.D.   Dg C-arm 1-60 Min  09/13/2011  *RADIOLOGY REPORT*  Clinical data:  Spinal stenosis  LUMBAR SPINE TWO VIEW  Comparison:  06/11/2011  Findings:  Two spot images from intraoperative C-arm fluoroscopy document changes of L4-5 PLIF.  Bilateral pedicle screws at each level with vertical interconnecting rods.  Graft markers project in the interspace.  Harrington rods are partially seen over the upper lumbar spine.  IMPRESSION: 1.  P L I F L4-5  Original Report Authenticated By: Osa Craver, M.D.    Assessment/Plan: Postop day 1: We will mobilize the patient with PT and OT. I will continue her PCA pump until tomorrow. Her Foley catheter has been discontinued.  LOS: 1 day     [name] D 09/14/2011, 10:18 AM

## 2011-09-14 NOTE — Evaluation (Signed)
Physical Therapy Evaluation Patient Details Name: Katie Ramirez MRN: [id number] DOB: 10/23/1980 Today's Date: 09/14/2011 Time: 4098-1191 PT Time Calculation (min): 39 min  PT Assessment / Plan / Recommendation Clinical Impression  Patient presents post L4-L5 decompressive laminectomy decompression of L4 and L5 nerve roots and a posterior lumbar interbody arthrodesis with peek spacers and pedicle screw fixation L4-L5. She is having significant pain of the right hip and leg with mobility. We will follow acutely to ensure that she can return home with decreased burden of care.     PT Assessment  Patient needs continued PT services    Follow Up Recommendations  No PT follow up    Barriers to Discharge  None      Equipment Recommendations  Rolling walker with 5" wheels;3 in 1 bedside comode    Recommendations for Other Services  None   Frequency Min 5X/week    Precautions / Restrictions Precautions Precautions: Back Precaution Booklet Issued: Yes (comment) Precaution Comments: Educated in back precautions, posture, body mechanics.  Required Braces or Orthoses: Spinal Brace Spinal Brace: Lumbar corset;Applied in sitting position   Pertinent Vitals/Pain Pain of right hip area 8/10 with mobility.       Mobility  Bed Mobility Bed Mobility: Rolling Left;Left Sidelying to Sit;Sitting - Scoot to [place] of Bed;Sit to Sidelying Left Rolling Left: 4: Min assist Left Sidelying to Sit: 4: Min assist Sitting - Scoot to Edge of Bed: 4: Min guard Sit to Sidelying Left: 4: Min assist Details for Bed Mobility Assistance: Patient able to roll from his side to supine without assistance. Assistance to ensure compliance with log roll and transition to sit/side.  Transfers Transfers: Sit to Stand;Stand to Sit Sit to Stand: With upper extremity assist;4: Min assist;From bed;From toilet Stand to Sit: With upper extremity assist;To toilet;To bed;4: Min assist Details for Transfer Assistance:  Educated in correct hand placement to and from device.  Ambulation/Gait Ambulation/Gait Assistance: 4: Min guard Ambulation Distance (Feet): 120 Feet Assistive device: Rolling walker Ambulation/Gait Assistance Details: Ambulates to close to the walker and verbal cues to correct. Slow speed secondary to pain.  Gait Pattern: Step-through pattern;Decreased stride length     PT Diagnosis: Difficulty walking;Acute pain  PT Problem List: Decreased activity tolerance;Decreased mobility;Decreased knowledge of use of DME;Decreased knowledge of precautions;Pain PT Treatment Interventions: DME instruction;Gait training;Stair training;Therapeutic activities;Patient/family education   PT Goals Acute Rehab PT Goals PT Goal Formulation: With patient Time For Goal Achievement: 09/21/11 Potential to Achieve Goals: Good Pt will Roll Supine to Left Side: with modified independence PT Goal: Rolling Supine to Left Side - Progress: Goal set today Pt will go Supine/Side to Sit: with modified independence PT Goal: Supine/Side to Sit - Progress: Goal set today Pt will go Sit to Supine/Side: with modified independence PT Goal: Sit to Supine/Side - Progress: Goal set today Pt will go Sit to Stand: with modified independence;with upper extremity assist PT Goal: Sit to Stand - Progress: Goal set today Pt will go Stand to Sit: with modified independence;with upper extremity assist PT Goal: Stand to Sit - Progress: Goal set today Pt will Ambulate: >150 feet;with modified independence;with least restrictive assistive device PT Goal: Ambulate - Progress: Goal set today Pt will Go Up / Down Stairs: 3-5 stairs;with rail(s);with supervision PT Goal: Up/Down Stairs - Progress: Goal set today Additional Goals Additional Goal #1: Patient will recall all back precautions and demonstrate adherence to in functional activity.  PT Goal: Additional Goal #1 - Progress: Goal set today  Visit  Information  Last PT Received On:  09/14/11 Assistance Needed: +1    Subjective Data  Subjective: Patient reports anterior left leg pain especially knee area prior to surgery - limiting walking and riding in a car.  Patient Stated Goal: Abolish pain.    Prior Functioning  Home Living Lives With: Family Available Help at Discharge: Family;Available 24 hours/day Type of Home: House Home Access: Stairs to enter [employer] of Steps: 3 up , 5 down. Or level entry.  Entrance Stairs-Rails: Right;Left;Can reach both Home Layout: One level (lives beneath parents house) Bathroom Shower/Tub: Network engineer: None Prior Function Level of Independence: Independent Able to Take Stairs?: Yes Driving: Yes Comments: Awaiting disability Communication Communication: No difficulties Dominant Hand: Right    Cognition  Overall Cognitive Status: Appears within functional limits for tasks assessed/performed Arousal/Alertness: Awake/alert Orientation Level: Appears intact for tasks assessed Behavior During Session: [facility] for tasks performed    Extremity/Trunk Assessment Right Lower Extremity Assessment RLE ROM/Strength/Tone: WFL for tasks assessed RLE Sensation: WFL - Light Touch;WFL - Proprioception RLE Coordination: WFL - gross/fine motor Left Lower Extremity Assessment LLE ROM/Strength/Tone: WFL for tasks assessed LLE Sensation: WFL - Light Touch;WFL - Proprioception LLE Coordination: WFL - gross/fine motor   Balance High Level Balance High Level Balance Comments: Close guarding with walker  End of Session PT - End of Session Equipment Utilized During Treatment: Gait belt Activity Tolerance: Patient limited by pain Patient left: in bed;with call bell/phone within reach Nurse Communication: Mobility status;Patient requests pain meds   Edwyna Perfect, PT  Pager [phone]  09/14/2011, 8:50 AM

## 2011-09-14 NOTE — Progress Notes (Signed)
RN notified of abnormal BP

## 2011-09-14 NOTE — Progress Notes (Signed)
CSW consult for SNF. PT with no f/u recommendations noted. CSW signing off as no other CSW needs identified. Please re-consult if CSW needs arise. Dellie Burns, MSW, [place] [phone] (weekend)

## 2011-09-15 ENCOUNTER — Inpatient Hospital Stay (HOSPITAL_COMMUNITY): Payer: Medicaid Other

## 2011-09-15 IMAGING — CR DG ABD PORTABLE 1V
1 series · 1 of 1 positions shown · non-contrast
Comparison: Intraoperative radiograph of the lumbar spine
[DATE].

CLINICAL DATA: Abdominal pain.  Evaluate for potential ileus
following spine surgery.

PORTABLE ABDOMEN - 1 VIEW

[AP]
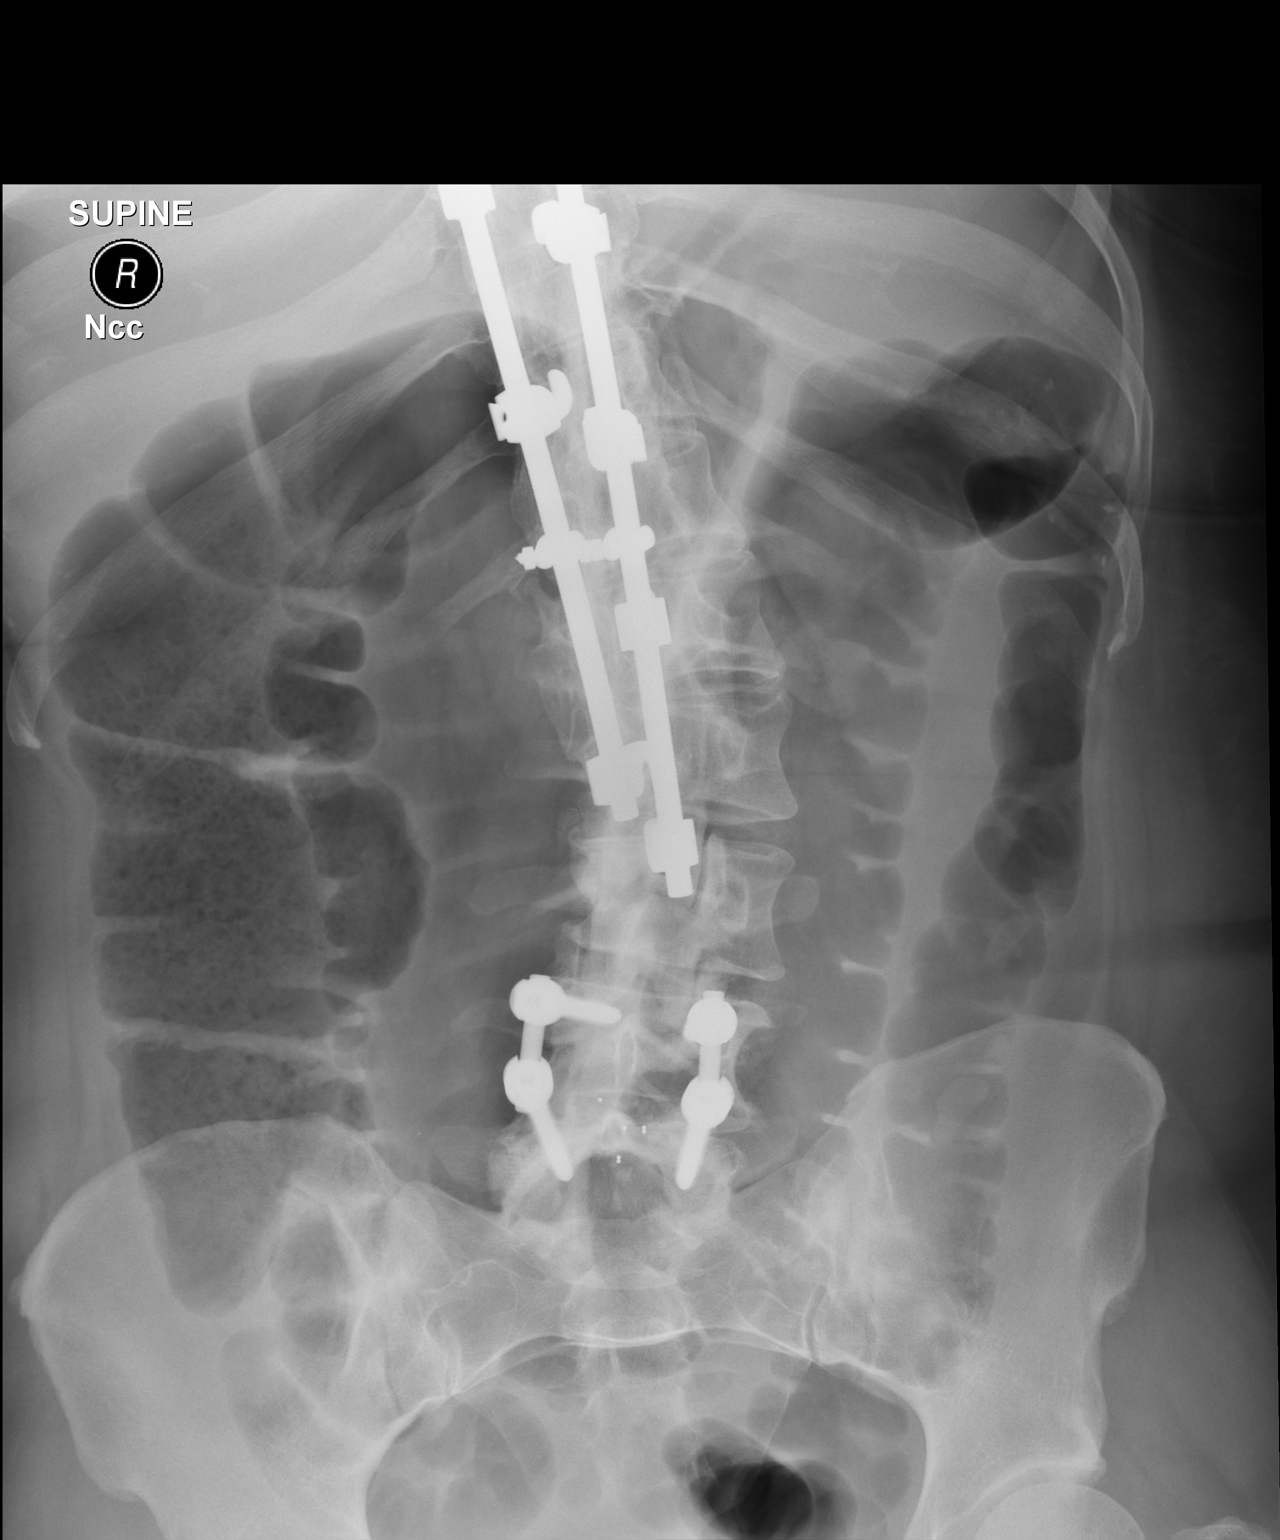

[1 of 1 positions shown; findings below may reference images not displayed]

FINDINGS: Postoperative changes of PLIF at L4-L5 are noted.  There
are also fixation rods extending from the thoracic spine into the
upper lumbar region.  There is diffuse gaseous distension of the
colon, extending all the way to the level of the rectum.  No
pathologic distension of small bowel is appreciated.  No gross
evidence of pneumoperitoneum on this single supine view.
IMPRESSION: 1.  Nonspecific bowel gas pattern, which could it could be
indicative of very mild colonic ileus.  No evidence of bowel
obstruction.  No pneumoperitoneum.

## 2011-09-15 MED ORDER — DOCUSATE SODIUM 100 MG PO CAPS
100.0000 mg | ORAL_CAPSULE | Freq: Two times a day (BID) | ORAL | Status: DC
  Administered 2011-09-15 – 2011-09-17 (×5): 100 mg via ORAL
  Filled 2011-09-15 (×4): qty 1

## 2011-09-15 MED ORDER — BISACODYL 10 MG RE SUPP
10.0000 mg | Freq: Every day | RECTAL | Status: DC | PRN
  Administered 2011-09-15: 10 mg via RECTAL
  Filled 2011-09-15 (×2): qty 1

## 2011-09-15 MED ORDER — MAGNESIUM HYDROXIDE 400 MG/5ML PO SUSP
30.0000 mL | Freq: Every day | ORAL | Status: DC | PRN
  Administered 2011-09-15: 30 mL via ORAL
  Filled 2011-09-15: qty 30

## 2011-09-15 MED ORDER — FLEET ENEMA 7-19 GM/118ML RE ENEM
1.0000 | ENEMA | Freq: Every day | RECTAL | Status: DC | PRN
  Filled 2011-09-15: qty 1

## 2011-09-15 NOTE — Progress Notes (Signed)
Physical Therapy Treatment Patient Details Name: Katie Ramirez MRN: [id number] DOB: August 11, 1980 Today's Date: 09/15/2011 Time: 2956-2130 PT Time Calculation (min): 17 min  PT Assessment / Plan / Recommendation Comments on Treatment Session  Pt still limited secondary to back pain as well as stomach pain(gas). Will continue per plan.    Follow Up Recommendations  No PT follow up       Equipment Recommendations  Rolling walker with 5" wheels;3 in 1 bedside comode       Frequency Min 5X/week   Plan Discharge plan remains appropriate;Frequency remains appropriate    Precautions / Restrictions Precautions Precautions: Back Precaution Booklet Issued: Yes (comment) Precaution Comments: pt able to verbalize 2/3 back precautions. Cueing throughout for safety. Pt reeducated Required Braces or Orthoses: Spinal Brace Spinal Brace: Lumbar corset;Applied in sitting position       Mobility  Bed Mobility Bed Mobility: Rolling Left;Left Sidelying to Sit;Sitting - Scoot to Edge of Bed Rolling Left: 5: Supervision Left Sidelying to Sit: 5: Supervision Sitting - Scoot to Edge of Bed: 5: Supervision Details for Bed Mobility Assistance: VC for proper sequencing to maintain back precautions. Cueing to roll completely onto L side prior to sitting up.  Transfers Transfers: Sit to Stand;Stand to Sit Sit to Stand: 4: Min guard;With upper extremity assist;From bed Stand to Sit: 4: Min guard;With upper extremity assist;To chair/3-in-1 Details for Transfer Assistance: VC for proper hand placement and sequencing to maintain safety to/from RW.  Ambulation/Gait Ambulation/Gait Assistance: 4: Min guard Ambulation Distance (Feet): 200 Feet Assistive device: Rolling walker Ambulation/Gait Assistance Details: VC for proper sequence and safety to distance to RW.  Gait Pattern: Step-through pattern;Decreased stride length Gait velocity: decreased gait speed     PT Goals Acute Rehab PT Goals PT Goal:  Rolling Supine to Left Side - Progress: Progressing toward goal PT Goal: Supine/Side to Sit - Progress: Progressing toward goal PT Goal: Sit to Supine/Side - Progress: Progressing toward goal PT Goal: Sit to Stand - Progress: Progressing toward goal PT Goal: Stand to Sit - Progress: Progressing toward goal PT Goal: Ambulate - Progress: Progressing toward goal Additional Goals PT Goal: Additional Goal #1 - Progress: Progressing toward goal  Visit Information  Last PT Received On: 09/15/11 Assistance Needed: +1       Cognition  Overall Cognitive Status: Appears within functional limits for tasks assessed/performed Arousal/Alertness: Awake/alert Orientation Level: Appears intact for tasks assessed Behavior During Session: [facility] for tasks performed       End of Session PT - End of Session Equipment Utilized During Treatment: Gait belt;Back brace Activity Tolerance: Patient limited by pain Patient left: in chair;with call bell/phone within reach;with family/visitor present Nurse Communication: Mobility status;Patient requests pain meds    Milana Kidney 09/15/2011, 11:08 AM  09/15/2011 Milana Kidney DPT PAGER: [phone] OFFICE: [phone]

## 2011-09-15 NOTE — Progress Notes (Signed)
Patient ID: Katie Ramirez, female   DOB: [birth year], 31 y.o.   MRN: [id number] Subjective:  The patient is alert and pleasant. She complains of constipation and bloating.  Objective: Vital signs in last 24 hours: Temp:  [97.9 F (36.6 C)-99.5 F (37.5 C)] 98.8 F (37.1 C) (07/21 0700) Pulse Rate:  [67-83] 83  (07/21 0700) Resp:  [16-23] 18  (07/21 0700) BP: (101-120)/(50-72) 101/54 mmHg (07/21 0700) SpO2:  [90 %-100 %] 99 % (07/21 0700)  Intake/Output from previous day: 07/20 0701 - 07/21 0700 In: 240 [P.O.:240] Out: -  Intake/Output this shift:    Physical exam the patient is alert and oriented. Her strength is normal. Her abdomen is protuberant but soft.  Lab Results: No results found for this basename: WBC:2,HGB:2,HCT:2,PLT:2 in the last 72 hours BMET No results found for this basename: NA:2,K:2,CL:2,CO2:2,GLUCOSE:2,BUN:2,CREATININE:2,CALCIUM:2 in the last 72 hours  Studies/Results: Dg Lumbar Spine 2-3 Views  09/13/2011  *RADIOLOGY REPORT*  Clinical data:  Spinal stenosis  LUMBAR SPINE TWO VIEW  Comparison:  06/11/2011  Findings:  Two spot images from intraoperative C-arm fluoroscopy document changes of L4-5 PLIF.  Bilateral pedicle screws at each level with vertical interconnecting rods.  Graft markers project in the interspace.  Harrington rods are partially seen over the upper lumbar spine.  IMPRESSION: 1.  P L I F L4-5  Original Report Authenticated By: Osa Craver, M.D.   Dg Lumbar Spine 2-3 Views  09/13/2011  *RADIOLOGY REPORT*  Clinical Data: Laminectomy and fusion.  LUMBAR SPINE - 2-3 VIEW  Comparison: Post-myelogram CT scan 06/11/2011.  Findings: We are provided with two intraoperative views of the lumbar spine in the lateral projection.  Images demonstrate localization of L4-5.  IMPRESSION: L4-5 localization.  Original Report Authenticated By: Bernadene Bell. D'ALESSIO, M.D.   Dg C-arm 1-60 Min  09/13/2011  *RADIOLOGY REPORT*  Clinical data:  Spinal stenosis   LUMBAR SPINE TWO VIEW  Comparison:  06/11/2011  Findings:  Two spot images from intraoperative C-arm fluoroscopy document changes of L4-5 PLIF.  Bilateral pedicle screws at each level with vertical interconnecting rods.  Graft markers project in the interspace.  Harrington rods are partially seen over the upper lumbar spine.  IMPRESSION: 1.  P L I F L4-5  Original Report Authenticated By: Osa Craver, M.D.    Assessment/Plan: Postop day #2: We will check a KUB to rule out ileus. The patient has been given a suppository and hopefully this will help her with her constipation.  LOS: 2 days     Jeanice Dempsey D 09/15/2011, 9:51 AM

## 2011-09-16 MED ORDER — SENNOSIDES-DOCUSATE SODIUM 8.6-50 MG PO TABS
1.0000 | ORAL_TABLET | Freq: Two times a day (BID) | ORAL | Status: DC
  Administered 2011-09-16 – 2011-09-17 (×3): 1 via ORAL
  Filled 2011-09-16 (×3): qty 1

## 2011-09-16 MED FILL — Sodium Chloride IV Soln 0.9%: INTRAVENOUS | Qty: 1000 | Status: AC

## 2011-09-16 MED FILL — Sodium Chloride Irrigation Soln 0.9%: Qty: 3000 | Status: AC

## 2011-09-16 MED FILL — Heparin Sodium (Porcine) Inj 1000 Unit/ML: INTRAMUSCULAR | Qty: 30 | Status: AC

## 2011-09-16 NOTE — Evaluation (Signed)
Occupational Therapy Evaluation Patient Details Name: Katie Ramirez MRN: [id number] DOB: [birth year] Today's Date: 09/16/2011 Time: 4098-1191 OT Time Calculation (min): 34 min  OT Assessment / Plan / Recommendation Clinical Impression  Pt s/p L4-L5 decompressive laminectomy decompression of L4 and L5 nerve roots and a posterior lumbar interbody arthrodesis with peek spacers and pedicle screw fixation L4-L5. Initially after sx, pt presents with significant pain, this is now improving. Pt will benefit from skilled OT in the acute setting to maximize I with ADL, bracement management and ADL mobility prior to d/c home    OT Assessment  Patient needs continued OT Services    Follow Up Recommendations  No OT follow up;Supervision - Intermittent    Barriers to Discharge      Equipment Recommendations  Rolling walker with 5" wheels    Recommendations for Other Services    Frequency  Min 2X/week    Precautions / Restrictions Precautions Precautions: Back Precaution Comments: Pt able to verbalize 3/3 back precautions  Required Braces or Orthoses: Spinal Brace Spinal Brace: Lumbar corset Restrictions Weight Bearing Restrictions: No   Pertinent Vitals/Pain Pt reports "discomfort" in low back but did not rate; pre-medicated    ADL  Grooming: Performed;Supervision/safety Where Assessed - Grooming: Unsupported standing Upper Body Bathing: Performed;Supervision/safety Where Assessed - Upper Body Bathing: Unsupported sitting Lower Body Bathing: Performed;Supervision/safety (used toileting aid for washing bottom) Where Assessed - Lower Body Bathing: Unsupported sit to stand Upper Body Dressing: Performed;Supervision/safety Where Assessed - Upper Body Dressing: Unsupported sitting Lower Body Dressing: Performed;Minimal assistance Where Assessed - Lower Body Dressing: Supported sitting Toilet Transfer: Research scientist (life sciences) Method: Sit to Writer: Regular height toilet Toileting - Clothing Manipulation and Hygiene: Performed;Supervision/safety Where Assessed - Engineer, mining and Hygiene: Standing (used toileting aid to wipe) Tub/Shower Transfer: Engineer, manufacturing Method: Science writer: Walk in shower;Grab bars Equipment Used: Gait belt (back brace removed for showering) Transfers/Ambulation Related to ADLs: Supervision with RW ambulation throughout room ADL Comments: Pt doing very well. Anticipate great progress    OT Diagnosis: Generalized weakness;Acute pain  OT Problem List: Decreased activity tolerance;Decreased knowledge of use of DME or AE;Decreased knowledge of precautions;Pain OT Treatment Interventions: Self-care/ADL training;DME and/or AE instruction;Therapeutic activities;Patient/family education;Balance training   OT Goals Acute Rehab OT Goals OT Goal Formulation: With patient Time For Goal Achievement: 09/23/11 Potential to Achieve Goals: Good ADL Goals Pt Will Perform Grooming: Independently;Standing at sink ADL Goal: Grooming - Progress: Goal set today Pt Will Perform Tub/Shower Transfer: Tub transfer;with supervision;Ambulation ADL Goal: Tub/Shower Transfer - Progress: Goal set today Additional ADL Goal #1: Pt will verbalize/generalize 3/3 back precautions for use with functional activities ADL Goal: Additional Goal #1 - Progress: Goal set today  Visit Information  Last OT Received On: 09/16/11 Assistance Needed: +1    Subjective Data  Subjective: I will feel like a new person if I can take a shower Patient Stated Goal: Return home with family   Prior Functioning  Vision/Perception  Home Living Lives With: Family Available Help at Discharge: Family;Available 24 hours/day Type of Home: House Home Access: Stairs to enter [employer] of Steps: 3 up , 5 down. Or level entry.  Entrance Stairs-Rails:  Right;Left;Can reach both Home Layout: One level Bathroom Shower/Tub: Engineer, manufacturing systems: Standard Home Adaptive Equipment: None Prior Function Level of Independence: Independent Able to Take Stairs?: Yes Driving: Yes Communication Communication: No difficulties Dominant Hand: Right      Cognition  Overall Cognitive Status:  Appears within functional limits for tasks assessed/performed Arousal/Alertness: Awake/alert Orientation Level: Appears intact for tasks assessed Behavior During Session: [facility] for tasks performed    Extremity/Trunk Assessment Right Upper Extremity Assessment RUE ROM/Strength/Tone: WFL for tasks assessed RUE Sensation: WFL - Light Touch RUE Coordination: WFL - gross/fine motor Left Upper Extremity Assessment LUE ROM/Strength/Tone: WFL for tasks assessed LUE Sensation: WFL - Light Touch LUE Coordination: WFL - gross/fine motor   Mobility Bed Mobility Bed Mobility: Not assessed Details for Bed Mobility Assistance: Pt seated in chair upon presentation. Transfers Sit to Stand: 6: Modified independent (Device/Increase time);From chair/3-in-1;With armrests Stand to Sit: 6: Modified independent (Device/Increase time);To chair/3-in-1;With upper extremity assist Details for Transfer Assistance: Mod I from bed and chair; Supervision from shower bench for safety with wet floor   Exercise    Balance    End of Session OT - End of Session Equipment Utilized During Treatment: Gait belt;Back brace Activity Tolerance: Patient tolerated treatment well Patient left: in chair;with call bell/phone within reach;with family/visitor present Nurse Communication: Mobility status  GO     Katie Ramirez 09/16/2011, 4:10 PM

## 2011-09-16 NOTE — Care Management Note (Signed)
    Page 1 of 1   09/16/2011     3:11:51 PM   CARE MANAGEMENT NOTE 09/16/2011  Patient:  AINSLIE, MAZUREK   Account Number:  [id number]  Date Initiated:  09/16/2011  Documentation initiated by:  Onnie Boer  Subjective/Objective Assessment:   PT WAS ADMITTED FOR BACK SURGERY     Action/Plan:   PROGRESSION OF CARE AND DISCHARGE PLANNING   Anticipated DC Date:  09/17/2011   Anticipated DC Plan:        DC Planning Services  CM consult      Choice offered to / List presented to:             Status of service:  In process, will continue to follow Medicare Important Message given?   (If response is "NO", the following Medicare IM given date fields will be blank) Date Medicare IM given:   Date Additional Medicare IM given:    Discharge Disposition:    Per UR Regulation:  Reviewed for med. necessity/level of care/duration of stay  If discussed at Long Length of Stay Meetings, dates discussed:    Comments:  09/16/11 Onnie Boer, RN, BSN [phone] PT WAS ADMITTED FOR A LUMBAR FUSION.  PTA PT WAS AT HOME WITH SELF CARE.  PT/OT HAS NO F/U RECOMMENDATIONS BESIDES A RW AND A 3N1.  WILL F/U WITH THE PT.

## 2011-09-16 NOTE — Progress Notes (Signed)
Patient ID: Katie Ramirez, female   DOB: 1980/06/18, 31 y.o.   MRN: [id number] Vital signs stable, but patient complains of pain in either lower  Extremity and back. Abdomen feels distended.  Incision clean and dry. Leg stregnth good.  Plan moblize, shower, recent enema. If improving feeling and function consider discharge home.

## 2011-09-16 NOTE — Progress Notes (Signed)
Agree and with the below note.

## 2011-09-16 NOTE — Progress Notes (Signed)
Pt c/o severe abdominal cramping and bright red blood  (very small amount)  from rectum when trying to have a BM. Dr Jeral Fruit paged and order given for soap suds enema, however pt refused stating she will rather have it done in the am. Will continue to monitor.

## 2011-09-16 NOTE — Progress Notes (Signed)
Physical Therapy Treatment Patient Details Name: Katie Ramirez MRN: [id number] DOB: 1980-07-26 Today's Date: 09/16/2011 Time: 4098-1191 PT Time Calculation (min): 17 min  PT Assessment / Plan / Recommendation Comments on Treatment Session  Pt tolerated session well. Pt reports feeling comfortable with improved mobility and with stair negotiation.    Follow Up Recommendations  No PT follow up    Barriers to Discharge        Equipment Recommendations  Rolling walker with 5" wheels;3 in 1 bedside comode    Recommendations for Other Services    Frequency Min 5X/week   Plan Discharge plan remains appropriate    Precautions / Restrictions Precautions Precautions: Back Precaution Comments: Pt able to verbalize 3/3 back precautions and confirmed having received precaution handout. Required Braces or Orthoses: Spinal Brace Spinal Brace: Lumbar corset Restrictions Weight Bearing Restrictions: No       Mobility  Bed Mobility Details for Bed Mobility Assistance: Pt seated in chair upon presentation. Transfers Transfers: Sit to Stand;Stand to Sit Sit to Stand: 6: Modified independent (Device/Increase time);With upper extremity assist;From chair/3-in-1 Stand to Sit: 6: Modified independent (Device/Increase time);With upper extremity assist;With armrests;To chair/3-in-1 Details for Transfer Assistance: Pt was modified independent during sit to stand and stand to sit due to increased time to complete the task secondary to decreased mobility secondary to surgery. Ambulation/Gait Ambulation/Gait Assistance: 6: Modified independent (Device/Increase time) Ambulation Distance (Feet): 400 Feet Assistive device: Rolling walker Ambulation/Gait Assistance Details: Pt was modified independent with ambulation using RW for stability. Pt performed ambulation well with minimum cueing for foot placement in walker during ambulation. Gait Pattern: Within Functional Limits Stairs: Yes Stairs  Assistance: 4: Min guard Stairs Assistance Details (indicate cue type and reason): Pt was able ascend/descend 6 stairs using right rail with min guard for safety. Stair Management Technique: One rail Right;Alternating pattern Number of Stairs: 6       PT Goals Acute Rehab PT Goals PT Goal Formulation: With patient PT Goal: Sit to Stand - Progress: Met PT Goal: Stand to Sit - Progress: Met PT Goal: Ambulate - Progress: Met PT Goal: Up/Down Stairs - Progress: Progressing toward goal Additional Goals PT Goal: Additional Goal #1 - Progress: Progressing toward goal (able to verbalize but did twist a little when donning brace in sitting)  Visit Information  Last PT Received On: 09/16/11 Assistance Needed: +1    Subjective Data  Subjective: "I am ready to take a shower."   Cognition  Overall Cognitive Status: Appears within functional limits for tasks assessed/performed Arousal/Alertness: Awake/alert Orientation Level: Appears intact for tasks assessed Behavior During Session: [facility] for tasks performed    Balance     End of Session PT - End of Session Equipment Utilized During Treatment: Gait belt;Back brace Activity Tolerance: Patient tolerated treatment well Patient left: in chair;with call bell/phone within reach;with nursing in room Nurse Communication: Mobility status   GP     [name] 09/16/2011, 2:48 PM

## 2011-09-17 MED ORDER — OXYCODONE-ACETAMINOPHEN 5-325 MG PO TABS: 1.0000 | ORAL_TABLET | ORAL | Status: AC | PRN

## 2011-09-17 MED ORDER — DIAZEPAM 5 MG PO TABS: 5.0000 mg | ORAL_TABLET | Freq: Four times a day (QID) | ORAL | Status: AC | PRN

## 2011-09-17 NOTE — Progress Notes (Signed)
Physical Therapy Treatment Patient Details Name: Katie Ramirez MRN: [id number] DOB: [birth year] Today's Date: 09/17/2011 Time: 4098-1191 PT Time Calculation (min): 21 min  PT Assessment / Plan / Recommendation Comments on Treatment Session  Pt tolerated session well. Majority of pt session spent on education and reinforcement of precautions during all mobility. Pt verbalized understanding of back precautions and use of rolling walker at home and that she feels comfortable with stairs.    Follow Up Recommendations  No PT follow up    Barriers to Discharge        Equipment Recommendations  Rolling walker with 5" wheels    Recommendations for Other Services    Frequency Min 5X/week   Plan Discharge plan remains appropriate    Precautions / Restrictions Precautions Precautions: Back Precaution Comments: Pt able to verbalize 3/3 back precautions and stated she had been reading the back precuations handout given previously. Required Braces or Orthoses: Spinal Brace Spinal Brace: Lumbar corset Restrictions Weight Bearing Restrictions: No       Mobility  Bed Mobility Bed Mobility: Rolling Left;Left Sidelying to Sit Rolling Left: 5: Supervision Left Sidelying to Sit: 5: Supervision Details for Bed Mobility Assistance: Supervision to maintain back precautions and to practice without the rails, since pt has no rails at home Transfers Transfers: Sit to Stand;Stand to Sit Sit to Stand: 6: Modified independent (Device/Increase time);From bed Stand to Sit: 6: Modified independent (Device/Increase time);To chair/3-in-1;With upper extremity assist Ambulation/Gait Ambulation/Gait Assistance: 6: Modified independent (Device/Increase time) Ambulation Distance (Feet): 400 Feet Assistive device: Rolling walker Ambulation/Gait Assistance Details: Pt educated on using walker after discharge during ambulation. Gait Pattern: Within Functional Limits Stairs: Yes Stairs Assistance: 5:  Supervision Stairs Assistance Details (indicate cue type and reason): Pt able to ascend/descend 6 stairs with right hand rail and min guarding.  Pt then attempted to ascend 6 stairs going sideways using right hand rail, but pt stated that going that way increased her back pain and that she preferred to use one hand on the rail while ascending/descending.  Stair Management Technique: One rail Right;Alternating pattern;Sideways Number of Stairs: 12       PT Goals Acute Rehab PT Goals PT Goal: Rolling Supine to Left Side - Progress: Progressing toward goal PT Goal: Supine/Side to Sit - Progress: Progressing toward goal PT Goal: Sit to Stand - Progress: Met PT Goal: Stand to Sit - Progress: Met PT Goal: Ambulate - Progress: Met PT Goal: Up/Down Stairs - Progress: Met Additional Goals PT Goal: Additional Goal #1 - Progress: Progressing toward goal (Pt needed verbal cues in bed to maintain back precautions)  Visit Information  Last PT Received On: 09/17/11 Assistance Needed: +1    Subjective Data  Subjective: "I am ready to go home."   Cognition  Overall Cognitive Status: Appears within functional limits for tasks assessed/performed Arousal/Alertness: Awake/alert Orientation Level: Appears intact for tasks assessed Behavior During Session: [facility] for tasks performed    Balance     End of Session PT - End of Session Equipment Utilized During Treatment: Gait belt;Back brace Activity Tolerance: Patient tolerated treatment well Patient left: in chair;with call bell/phone within reach   GP     Sandra Tellefsen 09/17/2011, 1:13 PM

## 2011-09-17 NOTE — Discharge Summary (Signed)
Physician Discharge Summary  Patient ID: Katie Ramirez MRN: [id number] DOB/AGE: Feb 03, 1981 31 y.o.  Admit date: 09/13/2011 Discharge date: 09/17/2011  Admission Diagnoses: Lumbar spondylosis and stenosis L4-L5, history of idiopathic scoliosis with arthrodesis from T7-L2  Discharge Diagnoses: [name] spondylosis and stenosis L4-L5, history of idiopathic scoliosis with arthrodesis T7-L2 paralytic ileus Active Problems:  * No active hospital problems. *    Discharged Condition: good  Hospital Course: Patient was admitted to undergo surgical decompression of L4-L5 having had spondylitic stenosis with L4 nerve root compromise particularly on the right side. The patient was taken to the operating room for the procedure which he tolerated well she did reasonably well during the postoperative period however she developed a paralytic ileus with significant retention of bowel gas. After several days of ambulation decreasing of narcotic analgesics she is able to move gas and the ileus resolved. the patient felt markedly improved area  Consults: None  Significant Diagnostic Studies: Lumbar myelogram June 2013  Treatments: surgery: Decompression L4-L5 with posterior interbody arthrodesis pedicle screw fixation L4-L5  Discharge Exam: Blood pressure 106/59, pulse 61, temperature 98.1 F (36.7 C), temperature source Oral, resp. rate 20, height 5\' 3"  (1.6 m), weight 92.08 kg (203 lb), SpO2 100.00%. Incision clean and dry motor function intact in iliopsoas quadriceps tibialis anterior and gastrocs  Disposition:  discharge home   Medication List  As of 09/17/2011  5:16 PM   ASK your doctor about these medications         ibuprofen 800 MG tablet   Commonly known as: ADVIL,MOTRIN   Take 800 mg by mouth every 8 (eight) hours as needed.      lisdexamfetamine 40 MG capsule   Commonly known as: VYVANSE   Take 50 mg by mouth every morning.      oxyCODONE-acetaminophen 5-325 MG per tablet   Commonly  known as: PERCOCET/ROXICET   Take 1 tablet by mouth every 4 (four) hours as needed. For pain      sertraline 100 MG tablet   Commonly known as: ZOLOFT   Take 100 mg by mouth daily.             SignedStefani Dama 09/17/2011, 5:16 PM

## 2011-09-17 NOTE — Progress Notes (Signed)
I have read and agree with treatment note and plan. Ivonne Andrew PT, DPT Pager: [phone]

## 2011-09-19 MED FILL — Oxycodone w/ Acetaminophen Tab 5-325 MG: ORAL | Qty: 2 | Status: AC

## 2011-09-19 MED FILL — Sertraline HCl Tab 100 MG: ORAL | Qty: 1 | Status: AC

## 2011-09-19 MED FILL — Ketorolac Tromethamine Inj 15 MG/ML: INTRAMUSCULAR | Qty: 1 | Status: AC

## 2011-09-19 MED FILL — Docusate Sodium Cap 100 MG: ORAL | Qty: 1 | Status: AC

## 2011-09-19 MED FILL — Sodium Chloride Flush IV Soln 0.9%: INTRAVENOUS | Qty: 3 | Status: AC

## 2011-09-23 ENCOUNTER — Other Ambulatory Visit: Payer: Self-pay

## 2011-09-23 NOTE — Telephone Encounter (Signed)
ERROR KP 

## 2012-03-06 ENCOUNTER — Emergency Department (HOSPITAL_COMMUNITY)
Admission: EM | Admit: 2012-03-06 | Discharge: 2012-03-06 | Disposition: A | Discharge status: Home / Self Care | Payer: Medicaid Other

## 2012-03-06 NOTE — ED Notes (Signed)
Pt called for triage no response ?

## 2012-06-15 ENCOUNTER — Other Ambulatory Visit: Payer: Self-pay | Admitting: Neurological Surgery

## 2012-06-15 DIAGNOSIS — M545 Low back pain, unspecified: Secondary | ICD-10-CM

## 2012-06-18 ENCOUNTER — Other Ambulatory Visit: Payer: Medicaid Other

## 2012-06-23 ENCOUNTER — Other Ambulatory Visit: Payer: Medicaid Other

## 2012-06-23 ENCOUNTER — Ambulatory Visit
Admission: RE | Admit: 2012-06-23 | Discharge: 2012-06-23 | Disposition: A | Discharge status: Home / Self Care | Payer: Medicaid Other | Source: Ambulatory Visit | Attending: Neurological Surgery | Admitting: Neurological Surgery

## 2012-06-23 DIAGNOSIS — M545 Low back pain: Secondary | ICD-10-CM

## 2012-06-23 IMAGING — CT CT L SPINE W/O CM
4 of 11 series · 11 of 36 positions shown, 12 images · non-contrast
Comparison: CT lumbar myelogram [DATE].

CLINICAL DATA: 31-year-old female with low back pain radiating to
the left lower extremity.  History of L4-L5 fusion in [1F].
History of scoliosis.

CT LUMBAR SPINE WITHOUT CONTRAST
TECHNIQUE: Multidetector CT imaging of the lumbar spine was
performed without intravenous contrast administration. Multiplanar
CT image reconstructions were also generated.

[Series 2: l spine bone · axial · 0.27mm/px · z∈[-198,-113]mm · 2 of 104 slices shown, 3 images]
[im 35/104  soft-tissue]
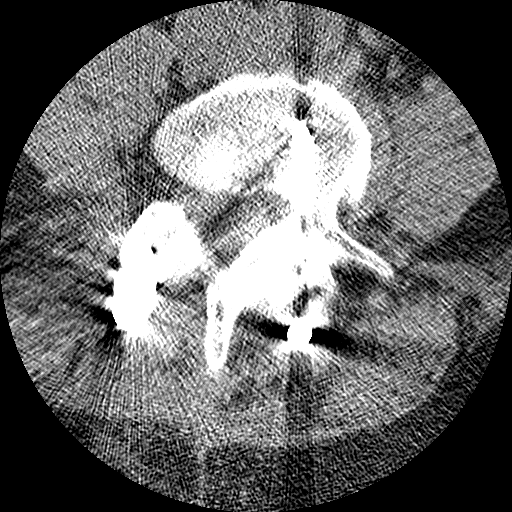
[im 35/104  bone]
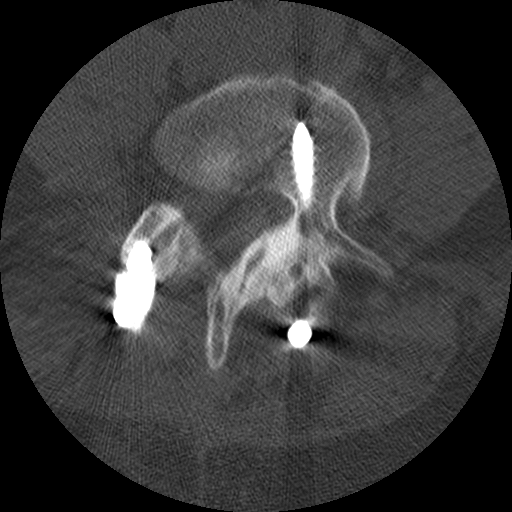
[im 69/104  bone]
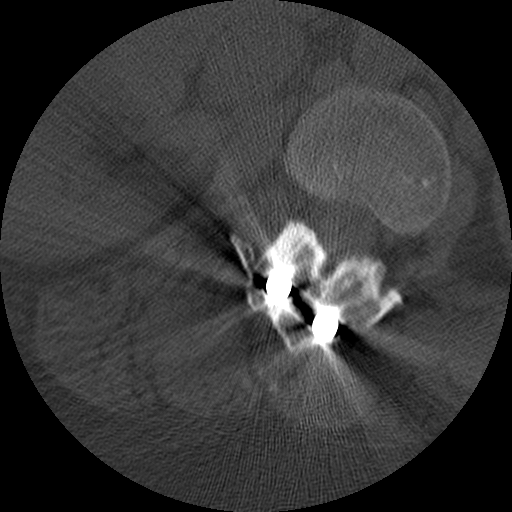

[Series 3: l spine soft · axial · 0.27mm/px · z∈[-198,-113]mm · 2 of 104 slices shown]
[im 35/104  soft-tissue]
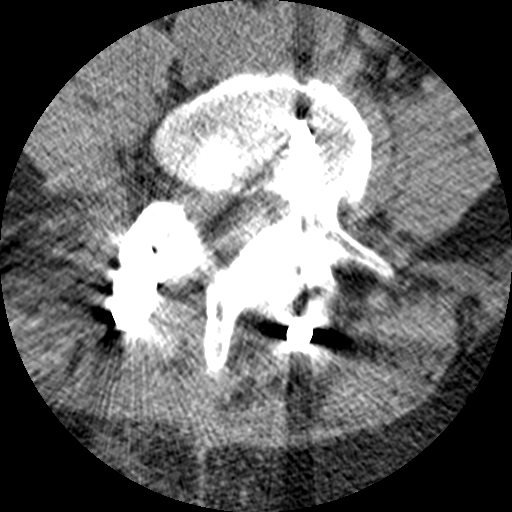
[im 69/104  soft-tissue]
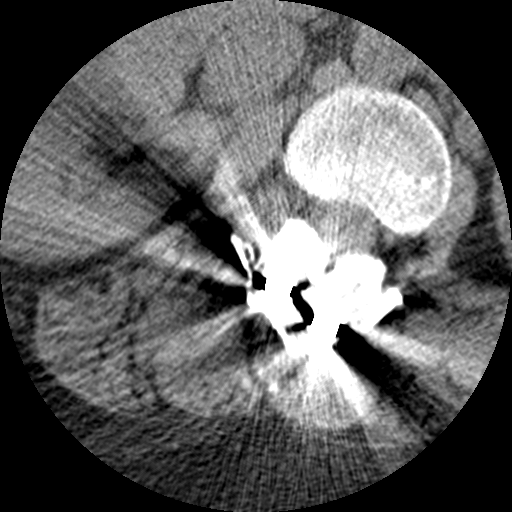

[Series 104: cor/lower · coronal · 0.27mm/px · 1 of 50 slices shown]
[im 25/50  bone]
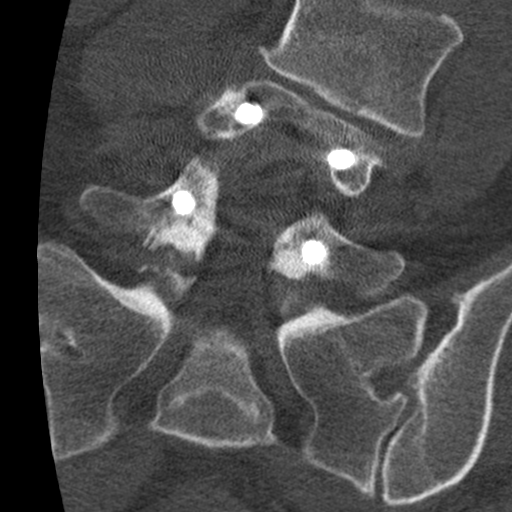

[Series 105: sag · sagittal · 0.52mm/px · 6 of 59 slices shown]
[im 20/59  bone]
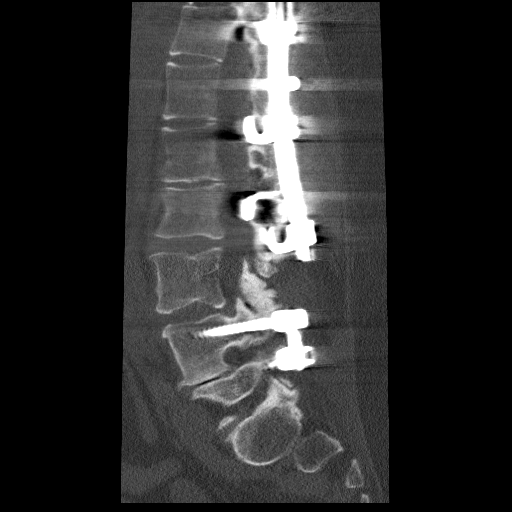
[im 25/59  bone]
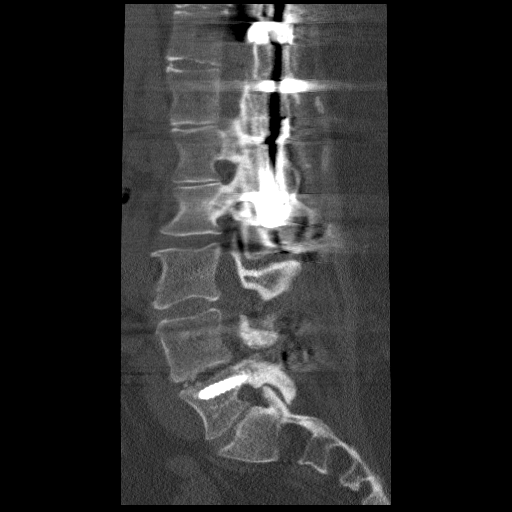
[im 30/59  bone]
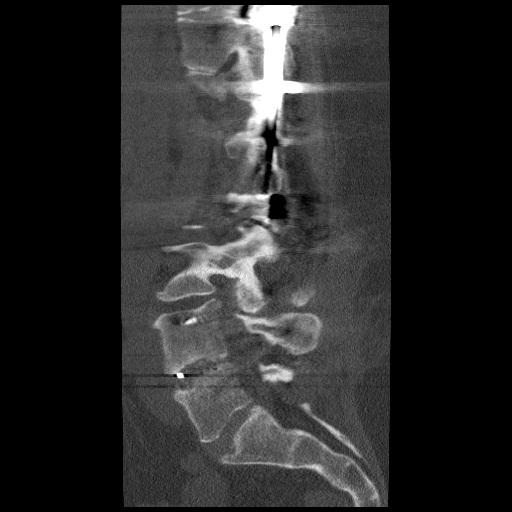
[im 34/59  bone]
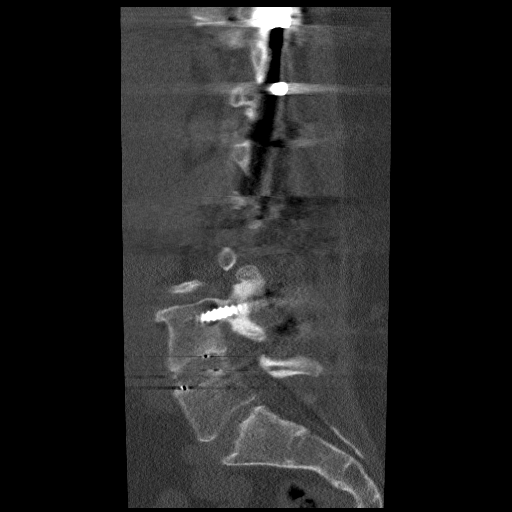
[im 37/59  soft-tissue]
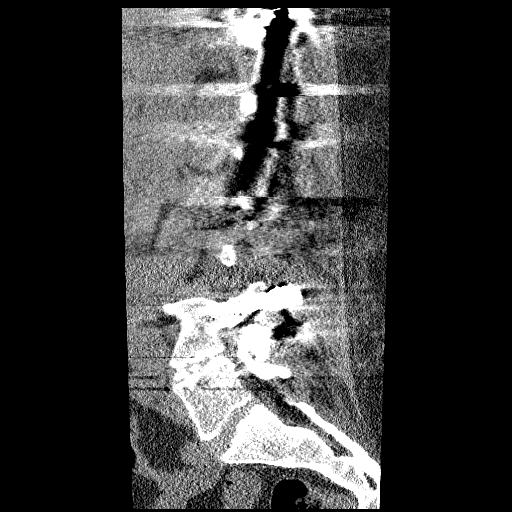
[im 39/59  bone]
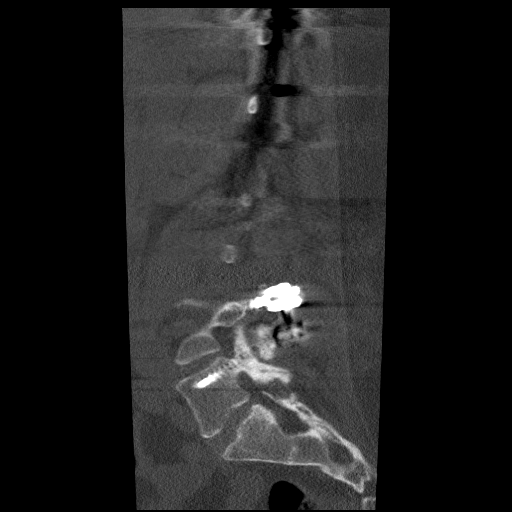

[11 of 36 positions shown; findings below may reference images not displayed]

FINDINGS: Moderate levoconvex lumbar scoliosis with a rotatory
component appears stable.  Chronic thoracic and upper lumbar
Harrington rods appears stable and terminate at the L2 lamina level
as before.

Streak artifact related to the spinal rods.  Ankylosis of the
posterior elements from the lower thoracic spine to the L2 lamina
and spinous process..  Visualized sacral ala and SI joints appear
stable within normal limits.

Stable visualized abdominal viscera.

L2-L3:  Stable right facet hypertrophy and mild circumferential
disc osteophyte complex.

L3-L4:  Chronic left greater than right facet degeneration with
vacuum facet phenomena.  Hardware streak artifact now at the level
related to the right side spinal fusion hardware described below.

L4-L5:  New bilateral transpedicular screws at both levels.  These
appear intact and are without evidence of loosening.  Interbody
implant in place.  No definite interbody arthrodesis.  Moderate to
severe bilateral facet hypertrophy again noted.  No posterior
element arthrodesis identified.

L5-S1:  Stable moderate to severe facet hypertrophy greater on the
right. Vacuum facet phenomenon on the right.  Stable mild
circumferential disc osteophyte complex.
IMPRESSION: 1.  Interval L4-L5 posterior and interbody fusion hardware
placement.  Hardware appears intact.  No interbody or posterior
element arthrodesis identified.
2.  Otherwise stable lumbar spine since the [DATE] CT
myelogram.
3.  Thoracolumbar scoliosis with chronic lower thoracic and upper
lumbar posterior element ankylosis to the L2 level.

## 2012-09-15 DIAGNOSIS — Z981 Arthrodesis status: Secondary | ICD-10-CM | POA: Insufficient documentation

## 2013-07-20 DIAGNOSIS — F319 Bipolar disorder, unspecified: Secondary | ICD-10-CM | POA: Diagnosis not present

## 2013-08-04 DIAGNOSIS — M25519 Pain in unspecified shoulder: Secondary | ICD-10-CM | POA: Diagnosis not present

## 2013-08-30 DIAGNOSIS — F319 Bipolar disorder, unspecified: Secondary | ICD-10-CM | POA: Diagnosis not present

## 2013-09-16 DIAGNOSIS — Z6841 Body Mass Index (BMI) 40.0 and over, adult: Secondary | ICD-10-CM | POA: Diagnosis not present

## 2013-09-16 DIAGNOSIS — IMO0002 Reserved for concepts with insufficient information to code with codable children: Secondary | ICD-10-CM | POA: Diagnosis not present

## 2013-09-16 DIAGNOSIS — M4120 Other idiopathic scoliosis, site unspecified: Secondary | ICD-10-CM | POA: Diagnosis not present

## 2013-09-16 DIAGNOSIS — R030 Elevated blood-pressure reading, without diagnosis of hypertension: Secondary | ICD-10-CM | POA: Diagnosis not present

## 2013-10-01 DIAGNOSIS — M47817 Spondylosis without myelopathy or radiculopathy, lumbosacral region: Secondary | ICD-10-CM | POA: Diagnosis not present

## 2013-10-01 DIAGNOSIS — IMO0002 Reserved for concepts with insufficient information to code with codable children: Secondary | ICD-10-CM | POA: Diagnosis not present

## 2013-10-01 DIAGNOSIS — Z981 Arthrodesis status: Secondary | ICD-10-CM | POA: Diagnosis not present

## 2013-10-01 DIAGNOSIS — M4180 Other forms of scoliosis, site unspecified: Secondary | ICD-10-CM | POA: Diagnosis not present

## 2013-10-18 DIAGNOSIS — F319 Bipolar disorder, unspecified: Secondary | ICD-10-CM | POA: Diagnosis not present

## 2013-10-19 DIAGNOSIS — Z79899 Other long term (current) drug therapy: Secondary | ICD-10-CM | POA: Diagnosis not present

## 2013-11-10 DIAGNOSIS — Z6841 Body Mass Index (BMI) 40.0 and over, adult: Secondary | ICD-10-CM | POA: Diagnosis not present

## 2013-11-10 DIAGNOSIS — M4120 Other idiopathic scoliosis, site unspecified: Secondary | ICD-10-CM | POA: Diagnosis not present

## 2013-12-08 DIAGNOSIS — Z6839 Body mass index (BMI) 39.0-39.9, adult: Secondary | ICD-10-CM | POA: Diagnosis not present

## 2013-12-08 DIAGNOSIS — M4120 Other idiopathic scoliosis, site unspecified: Secondary | ICD-10-CM | POA: Diagnosis not present

## 2013-12-14 DIAGNOSIS — F319 Bipolar disorder, unspecified: Secondary | ICD-10-CM | POA: Diagnosis not present

## 2014-02-07 DIAGNOSIS — F319 Bipolar disorder, unspecified: Secondary | ICD-10-CM | POA: Diagnosis not present

## 2014-03-15 DIAGNOSIS — F319 Bipolar disorder, unspecified: Secondary | ICD-10-CM | POA: Diagnosis not present

## 2014-05-17 DIAGNOSIS — F319 Bipolar disorder, unspecified: Secondary | ICD-10-CM | POA: Diagnosis not present

## 2014-12-14 DIAGNOSIS — M4120 Other idiopathic scoliosis, site unspecified: Secondary | ICD-10-CM | POA: Diagnosis not present

## 2014-12-30 DIAGNOSIS — M419 Scoliosis, unspecified: Secondary | ICD-10-CM | POA: Diagnosis not present

## 2014-12-30 DIAGNOSIS — M5416 Radiculopathy, lumbar region: Secondary | ICD-10-CM | POA: Diagnosis not present

## 2015-02-08 ENCOUNTER — Encounter: Payer: Self-pay | Admitting: Family Medicine

## 2015-02-08 ENCOUNTER — Ambulatory Visit (INDEPENDENT_AMBULATORY_CARE_PROVIDER_SITE_OTHER): Payer: Medicare Other | Admitting: Family Medicine

## 2015-02-08 VITALS — BP 112/70 | HR 62 | Temp 98.1°F | Ht 64.0 in | Wt 229.7 lb

## 2015-02-08 DIAGNOSIS — M545 Low back pain, unspecified: Secondary | ICD-10-CM | POA: Insufficient documentation

## 2015-02-08 DIAGNOSIS — Z7189 Other specified counseling: Secondary | ICD-10-CM | POA: Diagnosis present

## 2015-02-08 DIAGNOSIS — F32A Depression, unspecified: Secondary | ICD-10-CM | POA: Insufficient documentation

## 2015-02-08 DIAGNOSIS — M4120 Other idiopathic scoliosis, site unspecified: Secondary | ICD-10-CM

## 2015-02-08 DIAGNOSIS — M5441 Lumbago with sciatica, right side: Secondary | ICD-10-CM | POA: Diagnosis not present

## 2015-02-08 DIAGNOSIS — E669 Obesity, unspecified: Secondary | ICD-10-CM | POA: Diagnosis not present

## 2015-02-08 DIAGNOSIS — F329 Major depressive disorder, single episode, unspecified: Secondary | ICD-10-CM | POA: Insufficient documentation

## 2015-02-08 DIAGNOSIS — Z7689 Persons encountering health services in other specified circumstances: Secondary | ICD-10-CM

## 2015-02-08 MED ORDER — NAPROXEN SODIUM 220 MG PO TABS: 220.0000 mg | ORAL_TABLET | Freq: Two times a day (BID) | ORAL | Status: DC

## 2015-02-08 NOTE — Progress Notes (Signed)
    Subjective: CC: Establish care HPI: Katie Ramirez is a 34 y.o. female presenting to clinic today to establish care with a new provider. Concerns today include:  Establish care/ Low back pain Patient reports that she sees Dr Danielle DessElsner.  She notes that he performed her spinal fusion.  He also administers back injections every 3-4 months.  Back injections last her about 3 months.  She reports that she continues to have back pain on whatever side he is not injecting.  Her right side of her back continues to hurt her.  She reports that he was prescribing Norco for her back.  She reports that she was needing to take it as needed.  She had a prescription from Oct/ Nov that she still has pills left of.  She also takes Aleve.  Aleve helps some, but she notes that when she has over exerted herself it does not relieve the pain and she has to take the Hydrocodone.  Denies weakness, falls.  She endorses intermittent numbness and tingling in her legs if she has stood or walked a lot.  She notes that she is not able to perform simple tasks for very long without resting/ pain.  Denies urinary retention or fecal incontinence.  Social History Reviewed: no smoker. FamHx and MedHx reviewed.  Please see EMR. Health Maintenance: pap smear  ROS: Per HPI  Objective: Office vital signs reviewed. BP 112/70 mmHg  Pulse 62  Temp(Src) 98.1 F (36.7 C) (Oral)  Wt 229 lb 11.2 oz (104.191 kg)  SpO2 99%  Physical Examination:  General: Awake, alert, obese, No acute distress HEENT: Normal, MMM Cardio: regular rate and rhythm, S1S2 heard, no murmurs appreciated Pulm: clear to auscultation bilaterally, no wheezes, rhonchi or rales, normal WOB Extremities: warm, well perfused, No edema, cyanosis or clubbing; +2 pulses bilaterally MSK: Normal gait and station, flattening of thoracic and lumbar spine, prominent rib hump appreciated on right side T5-8, no midline TTP, +paraspinal TTP to the lower lumbar and sacral spine,  negative straight leg test b/l. Neuro: Strength and sensation grossly intact, patellar DTRs 2/4  Assessment/ Plan: 34 y.o. female   1. Establishing care with new doctor, encounter for - Updated history, medications  2. Idiopathic scoliosis - followed by neurologist  3. Right-sided low back pain with right-sided sciatica - followed by neurologist, where she gets back injections q3-6 months. - ROI form filled out at today's visit. - Discussed h/o opioid use for pain related to back.  Cited CDC recommendations.  Will discuss future need for opioids after office notes are received.  4. Obesity. Weight loss would greatly benefit patient, esp in the setting of chronic back pain. - TSH; Future - COMPLETE METABOLIC PANEL WITH GFR; Future - VITAMIN D 25 Hydroxy (Vit-D Deficiency, Fractures); Future - Make appt for PE within the next month  Katie Hulen SkainsM Gottschalk, DO PGY-2, [facility]

## 2015-02-08 NOTE — Patient Instructions (Signed)
Schedule a lab appointment up front for fasting labs.  Also, schedule an appointment with me for your physical exam with pap smear.

## 2015-03-06 ENCOUNTER — Ambulatory Visit (INDEPENDENT_AMBULATORY_CARE_PROVIDER_SITE_OTHER): Payer: Medicare Other | Admitting: Family Medicine

## 2015-03-06 ENCOUNTER — Telehealth: Payer: Self-pay

## 2015-03-06 VITALS — BP 130/80 | HR 86 | Temp 98.0°F | Resp 16 | Ht 64.0 in | Wt 227.0 lb

## 2015-03-06 DIAGNOSIS — R059 Cough, unspecified: Secondary | ICD-10-CM

## 2015-03-06 DIAGNOSIS — J329 Chronic sinusitis, unspecified: Secondary | ICD-10-CM | POA: Diagnosis not present

## 2015-03-06 DIAGNOSIS — R05 Cough: Secondary | ICD-10-CM | POA: Diagnosis not present

## 2015-03-06 DIAGNOSIS — J209 Acute bronchitis, unspecified: Secondary | ICD-10-CM | POA: Diagnosis not present

## 2015-03-06 DIAGNOSIS — H5711 Ocular pain, right eye: Secondary | ICD-10-CM | POA: Diagnosis not present

## 2015-03-06 MED ORDER — HYDROCODONE-HOMATROPINE 5-1.5 MG/5ML PO SYRP: 5.0000 mL | ORAL_SOLUTION | ORAL | Status: DC | PRN

## 2015-03-06 MED ORDER — AMOXICILLIN 875 MG PO TABS: 875.0000 mg | ORAL_TABLET | Freq: Two times a day (BID) | ORAL | Status: DC

## 2015-03-06 MED ORDER — IPRATROPIUM BROMIDE 0.03 % NA SOLN: 2.0000 | Freq: Four times a day (QID) | NASAL | Status: DC

## 2015-03-06 MED ORDER — ALBUTEROL SULFATE HFA 108 (90 BASE) MCG/ACT IN AERS: 2.0000 | INHALATION_SPRAY | RESPIRATORY_TRACT | Status: DC | PRN

## 2015-03-06 NOTE — Telephone Encounter (Signed)
Left message on patient's cell voicemail for her to call back and let us know which prescription it was that she was unable to get filled and what pharmacy she would like us to send it to.

## 2015-03-06 NOTE — Patient Instructions (Addendum)
Drink plenty of fluids and get enough rest  Use the ipratropium nose spray 2 sprays each nostril 4 times daily to try and open up the head  Continue using the Mucinex when needed to try and thin secretions  Take the Hycodan cough syrup 1 teaspoon every 4-6 hours as needed for bad cough, especially at nighttime  Use the albuterol inhaler 2 inhalations every 4-6 hours as needed for wheezing and coughing  Take the amoxicillin 875 mg one twice daily for infection. Take the full course.  Return if worse at anytime  Continue to use Tylenol or ibuprofen or Aleve as needed for headaches.

## 2015-03-06 NOTE — Progress Notes (Signed)
Patient ID: Katie Ramirez, female    DOB: [birth year]  Age: 35 y.o. MRN: [id number]  Chief Complaint  Patient presents with  . Nasal Congestion    x1 week; states her nose has been stuffy  . chest congestion    states it hurts to cough  . Cough    green sputum  . Fever  . Chills  . Eye Pain    right eye has been hurting    Subjective:   Patient is here with a respiratory tract infection. She's not been feeling good for about 10 days. Now she has pain down her right eye since yesterday. She's got head congestion and can't breathe well through her head. The cough is bothering her a lot at night. She had fever and chills last night. She used her child's albuterol nebulizer. She has felt lousy. She works as a Public librarianstylist. Her children brought the infection home she thinks.  Current allergies, medications, problem list, past/family and social histories reviewed.  Objective:  BP 130/80 mmHg  Pulse 86  Temp(Src) 98 F (36.7 C) (Oral)  Resp 16  Ht 5\' 4"  (1.626 m)  Wt 227 lb (102.967 kg)  BMI 38.95 kg/m2  SpO2 98%  Looks like she doesn't feel well. TMs are normal. Nose congested. Throat not erythematous. Some tenderness of the right maxillary or frontal sinus area. Neck supple without significant nodes. Chest has some expiratory wheeze and some scattered rhonchi. Heart regular without murmurs.  Assessment & Plan:   Assessment: 1. Rhinosinusitis   2. Orbital pain, right   3. Acute bronchitis, unspecified organism   4. Cough       Plan:   No orders of the defined types were placed in this encounter.    Meds ordered this encounter  Medications  . HYDROcodone-homatropine (HYCODAN) 5-1.5 MG/5ML syrup    Sig: Take 5 mLs by mouth every 4 (four) hours as needed.    Dispense:  120 mL    Refill:  0  . albuterol (PROVENTIL HFA;VENTOLIN HFA) 108 (90 Base) MCG/ACT inhaler    Sig: Inhale 2 puffs into the lungs every 4 (four) hours as needed for wheezing or shortness of breath (cough,  shortness of breath or wheezing.).    Dispense:  1 Inhaler    Refill:  1  . amoxicillin (AMOXIL) 875 MG tablet    Sig: Take 1 tablet (875 mg total) by mouth 2 (two) times daily.    Dispense:  20 tablet    Refill:  0  . ipratropium (ATROVENT) 0.03 % nasal spray    Sig: Place 2 sprays into both nostrils 4 (four) times daily.    Dispense:  30 mL    Refill:  0         Patient Instructions  Drink plenty of fluids and get enough rest  Use the ipratropium nose spray 2 sprays each nostril 4 times daily to try and open up the head  Continue using the Mucinex when needed to try and thin secretions  Take the Hycodan cough syrup 1 teaspoon every 4-6 hours as needed for bad cough, especially at nighttime  Use the albuterol inhaler 2 inhalations every 4-6 hours as needed for wheezing and coughing  Take the amoxicillin 875 mg one twice daily for infection. Take the full course.  Return if worse at anytime  Continue to use Tylenol or ibuprofen or Aleve as needed for headaches.     No Follow-up on file.   HOPPER,DAVID, MD 03/06/2015

## 2015-03-08 ENCOUNTER — Other Ambulatory Visit: Payer: Medicaid Other

## 2015-03-08 DIAGNOSIS — E669 Obesity, unspecified: Secondary | ICD-10-CM | POA: Diagnosis not present

## 2015-03-08 LAB — COMPLETE METABOLIC PANEL WITH GFR
ALBUMIN: 3.9 g/dL (ref 3.6–5.1)
ALT: 13 U/L (ref 6–29)
AST: 16 U/L (ref 10–30)
Alkaline Phosphatase: 58 U/L (ref 33–115)
BUN: 17 mg/dL (ref 7–25)
CHLORIDE: 101 mmol/L (ref 98–110)
CO2: 26 mmol/L (ref 20–31)
Calcium: 8.9 mg/dL (ref 8.6–10.2)
Creat: 0.86 mg/dL (ref 0.50–1.10)
GFR, Est African American: >89 mL/min (ref >=60)
GFR, Est Non African American: 87 mL/min (ref >=60)
GLUCOSE: 79 mg/dL (ref 65–99)
POTASSIUM: 4.3 mmol/L (ref 3.5–5.3)
SODIUM: 135 mmol/L (ref 135–146)
Total Bilirubin: 0.5 mg/dL (ref 0.2–1.2)
Total Protein: 6.4 g/dL (ref 6.1–8.1)

## 2015-03-08 LAB — TSH: TSH: 1.584 u[IU]/mL (ref 0.350–4.500)

## 2015-03-08 NOTE — Progress Notes (Signed)
Labs done today Anil Havard 

## 2015-03-09 ENCOUNTER — Encounter: Payer: Self-pay | Admitting: Family Medicine

## 2015-03-09 LAB — VITAMIN D 25 HYDROXY (VIT D DEFICIENCY, FRACTURES): Vit D, 25-Hydroxy: 30 ng/mL (ref 30–100)

## 2015-03-17 ENCOUNTER — Encounter: Payer: Self-pay | Admitting: Family Medicine

## 2015-03-17 ENCOUNTER — Other Ambulatory Visit (HOSPITAL_COMMUNITY)
Admission: RE | Admit: 2015-03-17 | Discharge: 2015-03-17 | Disposition: A | Discharge status: Home / Self Care | Payer: Medicare Other | Source: Ambulatory Visit | Attending: Family Medicine | Admitting: Family Medicine

## 2015-03-17 ENCOUNTER — Ambulatory Visit (INDEPENDENT_AMBULATORY_CARE_PROVIDER_SITE_OTHER): Payer: Medicare Other | Admitting: Family Medicine

## 2015-03-17 VITALS — BP 125/81 | HR 88 | Temp 98.1°F | Ht 64.0 in | Wt 224.1 lb

## 2015-03-17 DIAGNOSIS — M5441 Lumbago with sciatica, right side: Secondary | ICD-10-CM

## 2015-03-17 DIAGNOSIS — Z0001 Encounter for general adult medical examination with abnormal findings: Secondary | ICD-10-CM | POA: Diagnosis present

## 2015-03-17 DIAGNOSIS — R350 Frequency of micturition: Secondary | ICD-10-CM | POA: Diagnosis not present

## 2015-03-17 DIAGNOSIS — Z01419 Encounter for gynecological examination (general) (routine) without abnormal findings: Secondary | ICD-10-CM | POA: Diagnosis not present

## 2015-03-17 DIAGNOSIS — Z23 Encounter for immunization: Secondary | ICD-10-CM | POA: Diagnosis not present

## 2015-03-17 DIAGNOSIS — R6889 Other general symptoms and signs: Secondary | ICD-10-CM

## 2015-03-17 DIAGNOSIS — Z113 Encounter for screening for infections with a predominantly sexual mode of transmission: Secondary | ICD-10-CM | POA: Insufficient documentation

## 2015-03-17 DIAGNOSIS — Z1151 Encounter for screening for human papillomavirus (HPV): Secondary | ICD-10-CM | POA: Insufficient documentation

## 2015-03-17 DIAGNOSIS — Z124 Encounter for screening for malignant neoplasm of cervix: Secondary | ICD-10-CM | POA: Diagnosis not present

## 2015-03-17 DIAGNOSIS — M4120 Other idiopathic scoliosis, site unspecified: Secondary | ICD-10-CM | POA: Diagnosis not present

## 2015-03-17 NOTE — Progress Notes (Signed)
Katie Ramirez is a 35 y.o. female presents to office today for annual physical exam examination.  Concerns today include:  1. Urinary frequency Reports that she has had urinary frequency for several years.  She denies new back pain, fevers, hematuria, foul odors associated with urine, dysuria, polydipsia.  Notes that she just has "a lot of urine to pee out."  No difficulty voiding.  Last eye exam: not in adulthood Last dental exam: 12/2014 Last pap smear: 2014-2015 Immunizations needed: TDap, Flu Refills needed today: none  Past Medical History  Diagnosis Date  . Depression   . Scoliosis    Social History   Social History  . Marital Status: Single    Spouse Name: N/A  . Number of Children: N/A  . Years of Education: N/A   Occupational History  . Not on file.   Social History Main Topics  . Smoking status: Never Smoker   . Smokeless tobacco: Not on file  . Alcohol Use: Not on file  . Drug Use: Not on file  . Sexual Activity: No   Other Topics Concern  . Not on file   Social History Narrative   Past Surgical History  Procedure Laterality Date  . Back surgery      Harrington rods for scoliosis  . Cesarean section    . Endometrial ablation    . Spine surgery  2013    fusion by Dr Danielle Dess, s Rod in childhood at [facility]  . Btl     Family History  Problem Relation Age of Onset  . Hypertension Father   . Diabetes Father   . Atrial fibrillation Father   . Down syndrome Sister   . ADD / ADHD Daughter   . Asthma Son   . ADD / ADHD Son     ROS: Review of Systems Constitutional: negative Eyes: negative Ears, nose, mouth, throat, and face: negative Respiratory: negative Cardiovascular: negative Gastrointestinal: negative Genitourinary:positive for frequency and no longer has periods, s/p ablation. no vaginal bleeding or discharge Integument/breast: negative Hematologic/lymphatic: negative Musculoskeletal:positive for back pain Neurological:  negative Behavioral/Psych: negative Endocrine: negative Allergic/Immunologic: negative   Physical exam BP 125/81 mmHg  Pulse 88  Temp(Src) 98.1 F (36.7 C) (Oral)  Ht  (1.626 m)  Wt 224 lb 1.6 oz (101.651 kg)  BMI 38.45 kg/m2 General appearance: alert, cooperative, appears stated age, no distress and moderately obese Head: Normocephalic, without obvious abnormality, atraumatic Eyes: negative findings: lids and lashes normal, conjunctivae and sclerae normal, corneas clear and pupils equal, round, reactive to light and accomodation Ears: normal TM's and external ear canals both ears Nose: Nares normal. Septum midline. Mucosa normal. No drainage or sinus tenderness. Throat: lips, mucosa, and tongue normal; teeth and gums normal Neck: no adenopathy, supple, symmetrical, trachea midline and thyroid not enlarged, symmetric, no tenderness/mass/nodules Back: flattening of thoracic vertebrae.  Mild midline TTP along cervical, thoracic and lumbar spine. no paraspinal ttp.  Patient with known hardware in spine Lungs: clear to auscultation bilaterally, normal WOB on room air. Breasts: normal appearance, no masses or tenderness, Inspection negative, No nipple retraction or dimpling, No nipple discharge or bleeding, No axillary or supraclavicular adenopathy, Normal to palpation without dominant masses Heart: regular rate and rhythm, S1, S2 normal, no murmur, click, rub or gallop Abdomen: soft, non-tender; bowel sounds normal; no masses,  no organomegaly Pelvic: cervix normal in appearance, external genitalia normal, no adnexal masses or tenderness, no cervical motion tenderness, rectovaginal septum normal, uterus normal size, shape, and consistency  and moderate leukorrhea, no bleeding Extremities: extremities normal, atraumatic, no cyanosis or edema Pulses: 2+ and symmetric Skin: Skin color, texture, turgor normal. No rashes or lesions Lymph nodes: Cervical, supraclavicular, and axillary nodes  normal. Neurologic: Alert and oriented X 3, normal strength and tone. Normal symmetric reflexes. Normal coordination and gait  Psych: mood stable, speech normal.  Assessment/ Plan: Patient with h/o several back surgeries and scoliosis here for annual physical exam.   1. Encounter for general adult medical examination with abnormal findings - Pap as below - Flu shot and Tdap administered - Family, Medical, and surgical hx updated.  2. Screening for cervical cancer - HPV co-testing, GC/CT - Cytology - PAP ([place])  3. Idiopathic scoliosis: No apparent cardiac or respiratory compromise - Sees neurosurgery.  Gets back injections.  On Flexeril and Norco with that office.  Interested in seeing an orthopedist instead. - Ambulatory referral to Orthopedic Surgery  4. Right-sided low back pain with right-sided sciatica.  No focal neuro deficits - as above - Ambulatory referral to Orthopedic Surgery  5. Encounter for immunization - Flu adminstered  6. Need for Tdap vaccination - TDap administered  7. Urinary frequency. No evidence of infection so no UA obtained.  Reviewed recent fasting glucose which was NORMAL, so low suspicion of relation to DM.   - Reassurance - decrease fluid intake at bedtime   Ashly M. Nadine Counts, DO PGY-2, [facility]

## 2015-03-17 NOTE — Patient Instructions (Signed)
Plan to follow up in 1 year or sooner if needed.  I have placed the referral for orthopedics.  Someone should contact you next week. Health Maintenance, Female Adopting a healthy lifestyle and getting preventive care can go a long way to promote health and wellness. Talk with your health care provider about what schedule of regular examinations is right for you. This is a good chance for you to check in with your provider about disease prevention and staying healthy. In between checkups, there are plenty of things you can do on your own. Experts have done a lot of research about which lifestyle changes and preventive measures are most likely to keep you healthy. Ask your health care provider for more information. WEIGHT AND DIET  Eat a healthy diet  Be sure to include plenty of vegetables, fruits, low-fat dairy products, and lean protein.  Do not eat a lot of foods high in solid fats, added sugars, or salt.  Get regular exercise. This is one of the most important things you can do for your health.  Most adults should exercise for at least 150 minutes each week. The exercise should increase your heart rate and make you sweat (moderate-intensity exercise).  Most adults should also do strengthening exercises at least twice a week. This is in addition to the moderate-intensity exercise.  Maintain a healthy weight  Body mass index (BMI) is a measurement that can be used to identify possible weight problems. It estimates body fat based on height and weight. Your health care provider can help determine your BMI and help you achieve or maintain a healthy weight.  For females 79 years of age and older:   A BMI below 18.5 is considered underweight.  A BMI of 18.5 to 24.9 is normal.  A BMI of 25 to 29.9 is considered overweight.  A BMI of 30 and above is considered obese.  Watch levels of cholesterol and blood lipids  You should start having your blood tested for lipids and cholesterol at 37  years of age, then have this test every 5 years.  You may need to have your cholesterol levels checked more often if:  Your lipid or cholesterol levels are high.  You are older than 37 years of age.  You are at high risk for heart disease.  CANCER SCREENING   Lung Cancer  Lung cancer screening is recommended for adults 74-40 years old who are at high risk for lung cancer because of a history of smoking.  A yearly low-dose CT scan of the lungs is recommended for people who:  Currently smoke.  Have quit within the past 15 years.  Have at least a 30-pack-year history of smoking. A pack year is smoking an average of one pack of cigarettes a day for 1 year.  Yearly screening should continue until it has been 15 years since you quit.  Yearly screening should stop if you develop a health problem that would prevent you from having lung cancer treatment.  Breast Cancer  Practice breast self-awareness. This means understanding how your breasts normally appear and feel.  It also means doing regular breast self-exams. Let your health care provider know about any changes, no matter how small.  If you are in your 20s or 30s, you should have a clinical breast exam (CBE) by a health care provider every 1-3 years as part of a regular health exam.  If you are 45 or older, have a CBE every year. Also consider having a breast  X-ray (mammogram) every year.  If you have a family history of breast cancer, talk to your health care provider about genetic screening.  If you are at high risk for breast cancer, talk to your health care provider about having an MRI and a mammogram every year.  Breast cancer gene (BRCA) assessment is recommended for women who have family members with BRCA-related cancers. BRCA-related cancers include:  Breast.  Ovarian.  Tubal.  Peritoneal cancers.  Results of the assessment will determine the need for genetic counseling and BRCA1 and BRCA2 testing. Cervical  Cancer Your health care provider may recommend that you be screened regularly for cancer of the pelvic organs (ovaries, uterus, and vagina). This screening involves a pelvic examination, including checking for microscopic changes to the surface of your cervix (Pap test). You may be encouraged to have this screening done every 3 years, beginning at age 59.  For women ages 49-65, health care providers may recommend pelvic exams and Pap testing every 3 years, or they may recommend the Pap and pelvic exam, combined with testing for human papilloma virus (HPV), every 5 years. Some types of HPV increase your risk of cervical cancer. Testing for HPV may also be done on women of any age with unclear Pap test results.  Other health care providers may not recommend any screening for nonpregnant women who are considered low risk for pelvic cancer and who do not have symptoms. Ask your health care provider if a screening pelvic exam is right for you.  If you have had past treatment for cervical cancer or a condition that could lead to cancer, you need Pap tests and screening for cancer for at least 20 years after your treatment. If Pap tests have been discontinued, your risk factors (such as having a new sexual partner) need to be reassessed to determine if screening should resume. Some women have medical problems that increase the chance of getting cervical cancer. In these cases, your health care provider may recommend more frequent screening and Pap tests. Colorectal Cancer  This type of cancer can be detected and often prevented.  Routine colorectal cancer screening usually begins at 37 years of age and continues through 37 years of age.  Your health care provider may recommend screening at an earlier age if you have risk factors for colon cancer.  Your health care provider may also recommend using home test kits to check for hidden blood in the stool.  A small camera at the end of a tube can be used to  examine your colon directly (sigmoidoscopy or colonoscopy). This is done to check for the earliest forms of colorectal cancer.  Routine screening usually begins at age 12.  Direct examination of the colon should be repeated every 5-10 years through 37 years of age. However, you may need to be screened more often if early forms of precancerous polyps or small growths are found. Skin Cancer  Check your skin from head to toe regularly.  Tell your health care provider about any new moles or changes in moles, especially if there is a change in a mole's shape or color.  Also tell your health care provider if you have a mole that is larger than the size of a pencil eraser.  Always use sunscreen. Apply sunscreen liberally and repeatedly throughout the day.  Protect yourself by wearing long sleeves, pants, a wide-brimmed hat, and sunglasses whenever you are outside. HEART DISEASE, DIABETES, AND HIGH BLOOD PRESSURE   High blood pressure causes heart  disease and increases the risk of stroke. High blood pressure is more likely to develop in:  People who have blood pressure in the high end of the normal range (130-139/85-89 mm Hg).  People who are overweight or obese.  People who are African American.  If you are 46-25 years of age, have your blood pressure checked every 3-5 years. If you are 69 years of age or older, have your blood pressure checked every year. You should have your blood pressure measured twice--once when you are at a hospital or clinic, and once when you are not at a hospital or clinic. Record the average of the two measurements. To check your blood pressure when you are not at a hospital or clinic, you can use:  An automated blood pressure machine at a pharmacy.  A home blood pressure monitor.  If you are between 82 years and 75 years old, ask your health care provider if you should take aspirin to prevent strokes.  Have regular diabetes screenings. This involves taking a  blood sample to check your fasting blood sugar level.  If you are at a normal weight and have a low risk for diabetes, have this test once every three years after 37 years of age.  If you are overweight and have a high risk for diabetes, consider being tested at a younger age or more often. PREVENTING INFECTION  Hepatitis B  If you have a higher risk for hepatitis B, you should be screened for this virus. You are considered at high risk for hepatitis B if:  You were born in a country where hepatitis B is common. Ask your health care provider which countries are considered high risk.  Your parents were born in a high-risk country, and you have not been immunized against hepatitis B (hepatitis B vaccine).  You have HIV or AIDS.  You use needles to inject street drugs.  You live with someone who has hepatitis B.  You have had sex with someone who has hepatitis B.  You get hemodialysis treatment.  You take certain medicines for conditions, including cancer, organ transplantation, and autoimmune conditions. Hepatitis C  Blood testing is recommended for:  Everyone born from [birth year] through 1967.  Anyone with known risk factors for hepatitis C. Sexually transmitted infections (STIs)  You should be screened for sexually transmitted infections (STIs) including gonorrhea and chlamydia if:  You are sexually active and are younger than 37 years of age.  You are older than 37 years of age and your health care provider tells you that you are at risk for this type of infection.  Your sexual activity has changed since you were last screened and you are at an increased risk for chlamydia or gonorrhea. Ask your health care provider if you are at risk.  If you do not have HIV, but are at risk, it may be recommended that you take a prescription medicine daily to prevent HIV infection. This is called pre-exposure prophylaxis (PrEP). You are considered at risk if:  You are sexually active and do  not regularly use condoms or know the HIV status of your partner(s).  You take drugs by injection.  You are sexually active with a partner who has HIV. Talk with your health care provider about whether you are at high risk of being infected with HIV. If you choose to begin PrEP, you should first be tested for HIV. You should then be tested every 3 months for as long as you are taking  PrEP.  PREGNANCY   If you are premenopausal and you may become pregnant, ask your health care provider about preconception counseling.  If you may become pregnant, take 400 to 800 micrograms (mcg) of folic acid every day.  If you want to prevent pregnancy, talk to your health care provider about birth control (contraception). OSTEOPOROSIS AND MENOPAUSE   Osteoporosis is a disease in which the bones lose minerals and strength with aging. This can result in serious bone fractures. Your risk for osteoporosis can be identified using a bone density scan.  If you are 12 years of age or older, or if you are at risk for osteoporosis and fractures, ask your health care provider if you should be screened.  Ask your health care provider whether you should take a calcium or vitamin D supplement to lower your risk for osteoporosis.  Menopause may have certain physical symptoms and risks.  Hormone replacement therapy may reduce some of these symptoms and risks. Talk to your health care provider about whether hormone replacement therapy is right for you.  HOME CARE INSTRUCTIONS   Schedule regular health, dental, and eye exams.  Stay current with your immunizations.   Do not use any tobacco products including cigarettes, chewing tobacco, or electronic cigarettes.  If you are pregnant, do not drink alcohol.  If you are breastfeeding, limit how much and how often you drink alcohol.  Limit alcohol intake to no more than 1 drink per day for nonpregnant women. One drink equals 12 ounces of beer, 5 ounces of wine, or 1  ounces of hard liquor.  Do not use street drugs.  Do not share needles.  Ask your health care provider for help if you need support or information about quitting drugs.  Tell your health care provider if you often feel depressed.  Tell your health care provider if you have ever been abused or do not feel safe at home.   This information is not intended to replace advice given to you by your health care provider. Make sure you discuss any questions you have with your health care provider.   Document Released: 08/27/2010 Document Revised: 03/04/2014 Document Reviewed: 01/13/2013 Elsevier Interactive Patient Education [employer].

## 2015-03-21 LAB — CYTOLOGY - PAP

## 2015-03-23 ENCOUNTER — Encounter: Payer: Self-pay | Admitting: Family Medicine

## 2015-04-12 DIAGNOSIS — M4120 Other idiopathic scoliosis, site unspecified: Secondary | ICD-10-CM | POA: Diagnosis not present

## 2015-04-12 DIAGNOSIS — Z6839 Body mass index (BMI) 39.0-39.9, adult: Secondary | ICD-10-CM | POA: Diagnosis not present

## 2015-04-28 DIAGNOSIS — M41125 Adolescent idiopathic scoliosis, thoracolumbar region: Secondary | ICD-10-CM | POA: Diagnosis not present

## 2015-04-28 DIAGNOSIS — M5416 Radiculopathy, lumbar region: Secondary | ICD-10-CM | POA: Diagnosis not present

## 2015-06-28 ENCOUNTER — Encounter: Payer: Self-pay | Admitting: Family Medicine

## 2015-06-28 ENCOUNTER — Ambulatory Visit (INDEPENDENT_AMBULATORY_CARE_PROVIDER_SITE_OTHER): Payer: Medicare Other | Admitting: Family Medicine

## 2015-06-28 VITALS — BP 123/76 | HR 80 | Temp 98.4°F | Ht 64.0 in | Wt 217.2 lb

## 2015-06-28 DIAGNOSIS — G4489 Other headache syndrome: Secondary | ICD-10-CM | POA: Diagnosis present

## 2015-06-28 NOTE — Progress Notes (Signed)
Subjective: Katie Ramirez is a 35 y.o. female with a history of scoliosis and lumbar spondylosis and stenosis L4-5 here for evaluation of headache.  She reports headaches dating back to Nov 2016, which is when she had her first epidural injection for chronic back pain. The headache lasted about 2 weeks in Nov and recurred after her last injection in March 2017. It is on the top of her head, both sides, severe, daily, constant, "pounding," non-radiating, and debilitating. She denies nausea or vomiting, scotomata or aura sensation, congestion, tearing, runny nose, sweating, or facial swelling. No photophobia, though laying in a quiet, dark place sometimes helps the pain. Excedrin used to help the pain, but she takes ibuprofen instead to help with back pain, usually one to two doses per day every day. This offers no relief of the headache. Endorses chronic stress with being a mother, though no changes in family dynamics. Gets on average about 6 hours of sleep per night and is unable to exercise frequently. No recent changes in medications except recent addition of women's multivitamin. She had not had headaches prior to these episodes. She has not called the neurologist's office where she was given the injections.   - ROS: As above.  - Non-smoker  Objective: BP 123/76 mmHg  Pulse 80  Temp(Src) 98.4 F (36.9 C) (Oral)  Ht 5\' 4"  (1.626 m)  Wt 217 lb 3.2 oz (98.521 kg)  BMI 37.26 kg/m2 Gen:  35 y.o. female sitting in exam room in no distress HEENT: MMM, no eyelid edema, diaphoresis, or rhinorrhea noted, anicteric sclerae Neuro: Alert and oriented, speech normal, gait antalgic without wide base, CN II-XII intact, fundi without hemorrhages or cupping.    Assessment & Plan:  Katie Ramirez is a 35 y.o. female here for headaches.   See problem list for problem-specific plans.

## 2015-06-30 DIAGNOSIS — R51 Headache: Secondary | ICD-10-CM

## 2015-06-30 DIAGNOSIS — R519 Headache, unspecified: Secondary | ICD-10-CM | POA: Insufficient documentation

## 2015-06-30 NOTE — Assessment & Plan Note (Signed)
Likely multifactorial, non-migrainous, without red flag symptoms or focal deficits. Etiologies including tension-type headache and medication overuse headache. Headache as a side effect of steroid injection is certainly likely for the initial episodes, but lasting this long is unlikely.  - Urged her to limit po analgesic use to 3 or fewer times per week - Lifestyle recommendations included adequate rest and exercise, healthy diet - Discussed role of prophylactic medications, but would defer this to after trial limiting analgesics - Follow up with the neurologist - Follow up with PCP

## 2015-06-30 NOTE — Patient Instructions (Signed)
Analgesic Rebound Headaches An analgesic rebound headache is a headache that returns after pain medicine (analgesic) that was taken to treat the initial headache wears off. People who suffer from tension, migraine, or cluster headaches are at risk for developing rebound headaches. Any type of primary headache can return as a rebound headache if you regularly take analgesics more than three times a week. If the cycle of rebound headaches continues, they become chronic daily headaches.  CAUSES Analgesics frequently associated with this problem include common over-the-counter medicines like aspirin, ibuprofen, acetaminophen, sinus relief medicines, and other medicines that contain caffeine. Narcotic pain medicines are also a common cause of rebound headaches.  SIGNS AND SYMPTOMS The symptoms of rebound headaches are the same as the symptoms of your initial headache. Symptoms of specific types of headaches include: Tension headache  Pressure around the head.  Dull, aching head pain.  Pain felt over the front and sides of the head.  Tenderness in the muscles of the head, neck and shoulders. Migraine Headache  Pulsing or throbbing pain on one or both sides of the head.  Severe pain that interferes with daily activities.  Pain that is worsened by physical activity.  Nausea, vomiting, or both.  Pain with exposure to bright light, loud noises, or strong smells.  General sensitivity to bright light, loud noises, or strong smells.  Visual changes.  Numbness of one or both arms. Cluster Headaches  Severe pain that begins in or around one eye or temple.  Redness in the eye on the same side as the pain.  Droopy or swollen eyelid.  One-sided head pain.  Nausea.  Runny nose.  Sweaty, pale facial skin.  Restlessness. DIAGNOSIS  Analgesic rebound headaches are diagnosed by reviewing your medical history. This includes the nature of your initial headaches, as well as the type of pain  medicines you have been using to treat your headaches and how often you take them. TREATMENT Discontinuing frequent use of the analgesic medicine will typically reduce the frequency of the rebound episodes. This may initially worsen your headaches but eventually the pain should become more manageable, less frequent, and less severe.  Seeing a headache specialists may helpful. He or she may be able to help you manage your headaches and to make sure there is not another cause of the headaches. Alternative methods of stress relief such as acupuncture, counseling, biofeedback, and massage may also be helpful. Talk with your health care provider about which alternative treatments might be good for you. HOME CARE INSTRUCTIONS Stopping the regular use of pain medicine can be difficult. Follow your health care provider's instructions carefully. Keep all of your appointments. Avoid triggers that are known to cause your primary headaches. SEEK MEDICAL CARE IF: You continue to experience headaches after following your health care provider's recommended treatments. SEEK IMMEDIATE MEDICAL CARE IF:  You develop new headache pain.  You develop headache pain that is different than what you have experienced in the past.  You develop numbness or tingling in your arms or legs.  You develop changes in your speech or vision. MAKE SURE YOU:  Understand these instructions.  Will watch your child's condition.  Will get help right away if your child is not doing well or gets worse.   This information is not intended to replace advice given to you by your health care provider. Make sure you discuss any questions you have with your health care provider.   Document Released: 05/04/2003 Document Revised: 03/04/2014 Document Reviewed: 08/27/2012 Elsevier   Interactive Patient Education 2016 Elsevier Inc.  

## 2015-09-18 ENCOUNTER — Encounter (HOSPITAL_COMMUNITY): Payer: Self-pay | Admitting: Emergency Medicine

## 2015-09-18 ENCOUNTER — Emergency Department (HOSPITAL_COMMUNITY)
Admission: EM | Admit: 2015-09-18 | Discharge: 2015-09-18 | Disposition: A | Discharge status: Home / Self Care | Payer: Medicare Other | Attending: Emergency Medicine | Admitting: Emergency Medicine

## 2015-09-18 DIAGNOSIS — M436 Torticollis: Secondary | ICD-10-CM | POA: Diagnosis not present

## 2015-09-18 DIAGNOSIS — M542 Cervicalgia: Secondary | ICD-10-CM | POA: Diagnosis present

## 2015-09-18 MED ORDER — CYCLOBENZAPRINE HCL 10 MG PO TABS: 10.0000 mg | ORAL_TABLET | Freq: Two times a day (BID) | ORAL | 0 refills | Status: DC | PRN

## 2015-09-18 MED ORDER — DIAZEPAM 5 MG PO TABS
10.0000 mg | ORAL_TABLET | Freq: Once | ORAL | Status: AC
  Administered 2015-09-18: 10 mg via ORAL
  Filled 2015-09-18: qty 2

## 2015-09-18 NOTE — ED Triage Notes (Signed)
Pt sts neck pain on right side into arm after sleeping in recliner; pt sts chronic issues

## 2015-09-18 NOTE — ED Provider Notes (Signed)
[facility]-EMERGENCY DEPT Provider Note   CSN: [id number] Arrival date & time: 09/18/15  1535  First Provider Contact:  First MD Initiated Contact with Patient 09/18/15 1931        History   Chief Complaint Chief Complaint  Patient presents with  . Neck Pain    HPI Katie Ramirez is a 35 y.o. female.  HPI  This is a 35 year old female who woke up with pain in the right side of her neck on Saturday morning after sleeping in a recliner on Friday night. She has had continued muscle spasm type pain with her head pulled to the right since that time. She has taken ibuprofen in the medication her mother gave her without relief. She has not noted any numbness, tingling, or weakness in her upper extremities. She has not had a headache or vision changes. She has a history of scoliosis and has had ems in her neck and back in the past.  Past Medical History:  Diagnosis Date  . Depression   . Scoliosis     Patient Active Problem List   Diagnosis Date Noted  . Headache 06/30/2015  . Idiopathic scoliosis 02/08/2015  . Obesity 02/08/2015  . Lumbar pain 02/08/2015  . Depression 02/08/2015    Past Surgical History:  Procedure Laterality Date  . BACK SURGERY     Harrington rods for scoliosis  . BTL    . CESAREAN SECTION    . ENDOMETRIAL ABLATION    . SPINE SURGERY  2013   fusion by Dr Zameer Borman Dess, s Rod in childhood at [facility] History    No data available       Home Medications    Prior to Admission medications   Medication Sig Start Date End Date Taking? Authorizing Provider  cyclobenzaprine (FLEXERIL) 10 MG tablet Take 1 tablet (10 mg total) by mouth 2 (two) times daily as needed for muscle spasms. 09/18/15   Margarita Grizzle, MD    Family History Family History  Problem Relation Age of Onset  . Hypertension Father   . Diabetes Father   . Atrial fibrillation Father   . Down syndrome Sister   . ADD / ADHD Daughter   . Asthma Son   . ADD / ADHD Son     Social History Social  History  Substance Use Topics  . Smoking status: Never Smoker  . Smokeless tobacco: Not on file  . Alcohol use No     Allergies   Review of patient's allergies indicates no known allergies.   Review of Systems Review of Systems  All other systems reviewed and are negative.    Physical Exam Updated Vital Signs BP (!) 149/105 (BP Location: Right Arm)   Pulse 77   Temp 98.4 F (36.9 C) (Oral)   Resp 20   Ht  (1.6 m)   Wt 95.3 kg   SpO2 99%   BMI 37.20 kg/m   Physical Exam  Constitutional: She is oriented to person, place, and time. She appears well-developed and well-nourished. No distress.  HENT:  Head: Normocephalic and atraumatic.  Right Ear: External ear normal.  Left Ear: External ear normal.  Nose: Nose normal.  Eyes: EOM are normal. Pupils are equal, round, and reactive to light.  Neck:  Patient is holding her neck spasm to the right. There is muscle spasm on palpation. Her muscles are tender to palpation in the right trapezius area..  Musculoskeletal: Normal range of motion. She exhibits no deformity.  Neurological:  She is alert and oriented to person, place, and time. She has normal reflexes. She displays normal reflexes. No cranial nerve deficit. She exhibits normal muscle tone. Coordination normal.  Strength is equal in bilateral upper extremities including shoulder elbow, and intrinsic hand muscles.  Skin: Skin is warm and dry. Capillary refill takes less than 2 seconds.  Psychiatric: She has a normal mood and affect.  Nursing note and vitals reviewed.    ED Treatments / Results  Labs (all labs ordered are listed, but only abnormal results are displayed) Labs Reviewed - No data to display  EKG  EKG Interpretation None       Radiology No results found.  Procedures Procedures (including critical care time)  Medications Ordered in ED Medications  diazepam (VALIUM) tablet 10 mg (not administered)     Initial Impression / Assessment  and Plan / ED Course  I have reviewed the triage vital signs and the nursing notes.  Pertinent labs & imaging results that were available during my care of the patient were reviewed by me and considered in my medical decision making (see chart for details).  Clinical Course      Final Clinical Impressions(s) / ED Diagnoses   Final diagnoses:  Torticollis  Torticollis, acute    New Prescriptions New Prescriptions   CYCLOBENZAPRINE (FLEXERIL) 10 MG TABLET    Take 1 tablet (10 mg total) by mouth 2 (two) times daily as needed for muscle spasms.     Margarita Grizzle, MD 09/20/15 [phone]

## 2015-09-20 ENCOUNTER — Telehealth: Payer: Self-pay | Admitting: *Deleted

## 2015-09-20 NOTE — Telephone Encounter (Signed)
Pt being seeing tomorrow in [place] [facility]. Sunday Spillers, CMA

## 2015-09-20 NOTE — Telephone Encounter (Signed)
Patient welcome to come in for same day appt.  The ED note unfortunately is unfinished, but it appears that she was seen for torticolis (neck spasm).  If she is having neurologic deficits, I recommend urgent evaluation.

## 2015-09-20 NOTE — Telephone Encounter (Signed)
Pt needing referral for ortho for neck pain ("neck locked up"). Please advise. Deseree Bruna Potter, CMA

## 2015-09-21 ENCOUNTER — Ambulatory Visit (INDEPENDENT_AMBULATORY_CARE_PROVIDER_SITE_OTHER): Payer: Medicare Other | Admitting: Internal Medicine

## 2015-09-21 VITALS — BP 132/68 | HR 71 | Temp 98.6°F | Wt 223.0 lb

## 2015-09-21 DIAGNOSIS — S139XXA Sprain of joints and ligaments of unspecified parts of neck, initial encounter: Secondary | ICD-10-CM

## 2015-09-21 DIAGNOSIS — S134XXA Sprain of ligaments of cervical spine, initial encounter: Secondary | ICD-10-CM | POA: Diagnosis present

## 2015-09-21 NOTE — Patient Instructions (Addendum)
You can also take Tylenol up to 1000mg  (extra strength) every 6 hours as needed for pain.   Please try the following home exercises.   If your symptoms do not improve in the next week or so please return to clinic.   Home exercise - Home exercise maintains range of motion and helps patients become active participants in their care. Gentle stretching exercises, including shoulder rolls and neck stretches, should be instituted on a daily basis once acute symptoms are under control:  Neck rotation (picture 1) - Slowly turn the head to the right. Place tension on the chin with the fingertips. Hold for a few seconds and return to the center. Repeat to the left.  Neck tilting (picture 2) - Tilt the head to the right, trying to touch the ear to the tip of the shoulder. Place tension on the temple with the fingertips. Hold for a few seconds and return to the center. Repeat to the left.  Neck bending - Try to touch the chin to the chest. Hold for a few seconds and return to the neutral position. Breathe in gradually, and exhale slowly with each exercise. Relax the neck and back muscles with each neck bend.  Shoulder rolls - In the sitting or standing position, pull the arms backwards. Try to pinch the shoulder blades together, and then roll the arms forward then backward in a rhythmic, rowing motion.  Patients should be advised to heat their neck and upper back in a bathtub, shower, or with moist towels heated in a microwave prior to beginning exercises. The muscles are gently stretched in sets of 10 to 15 repetitions with each held for five seconds. It is best to perform the exercises in the morning and just before sleeping.

## 2015-09-21 NOTE — Progress Notes (Signed)
   [facility] Phone: [phone]   Date of Visit: 09/21/2015   HPI:  Neck Pain, with right > left: - started Saturday after she slept in the recliner - was seen in the ED on Monday and was given Valium and Flexeril - reports Valium helped with symptoms very well  - Flexeril makes her significantly drowsy so she does not like to use it - has been using heating pad  - symptoms are improving  - no radiation of pain down arms, no numbness or tingling - reports she has a bulging disc in her neck; she has a history of scoliosis and is followed by Dr. Barnett Abu (neurosurgeon)- will see him in August - reports she takes Ibuprofen 800mg  daily for her chronic pain from scoliosis  ROS: See HPI.  PMFSH:  Scoliosis  PHYSICAL EXAM: BP 132/68   Pulse 71   Temp 98.6 F (37 C) (Oral)   Wt 223 lb (101.2 kg)   SpO2 100%   BMI 39.50 kg/m  GEN: NAD MSK: no spinal tenderness. Reports of soreness to palpation on right upper border of trapezius. ROM of Neck: decreased rotation to both sides due to muscle soreness, able to extend neck normally, reports of pulling sensation with neck flexion. Spurling test negative.  Shoulders: able to do shoulder rolls without discomfort. Upper extremities: normal strength and sensation to light touch. Normal brachioradial reflexes bilaterally. Normal capillary reflex.    ASSESSMENT/PLAN: Cervical Muscle Strain: symptoms after sleeping on a recliner. No radicular symptoms, no neurologic deficits. Symptoms are improving which is reassuring.  - provided stretching exercises - recommended Tylenol 1000mg  q 6 hours PRN; she can continue to take Ibuprofen 800mg  daily as she usually does  - follow up    Palma Holter, MD PGY 2 [facility]

## 2015-10-26 DIAGNOSIS — M41126 Adolescent idiopathic scoliosis, lumbar region: Secondary | ICD-10-CM | POA: Diagnosis not present

## 2015-10-26 DIAGNOSIS — Z6837 Body mass index (BMI) 37.0-37.9, adult: Secondary | ICD-10-CM | POA: Diagnosis not present

## 2015-11-07 DIAGNOSIS — M5136 Other intervertebral disc degeneration, lumbar region: Secondary | ICD-10-CM | POA: Diagnosis not present

## 2015-11-07 DIAGNOSIS — M5416 Radiculopathy, lumbar region: Secondary | ICD-10-CM | POA: Diagnosis not present

## 2015-11-07 DIAGNOSIS — M4806 Spinal stenosis, lumbar region: Secondary | ICD-10-CM | POA: Diagnosis not present

## 2015-12-11 DIAGNOSIS — M79672 Pain in left foot: Secondary | ICD-10-CM | POA: Diagnosis not present

## 2015-12-11 DIAGNOSIS — S93692A Other sprain of left foot, initial encounter: Secondary | ICD-10-CM | POA: Diagnosis not present

## 2015-12-26 DIAGNOSIS — S93692D Other sprain of left foot, subsequent encounter: Secondary | ICD-10-CM | POA: Diagnosis not present

## 2015-12-26 DIAGNOSIS — M79672 Pain in left foot: Secondary | ICD-10-CM | POA: Diagnosis not present

## 2016-01-16 DIAGNOSIS — S93602D Unspecified sprain of left foot, subsequent encounter: Secondary | ICD-10-CM | POA: Diagnosis not present

## 2016-01-16 DIAGNOSIS — M79672 Pain in left foot: Secondary | ICD-10-CM | POA: Diagnosis not present

## 2016-01-20 DIAGNOSIS — M79672 Pain in left foot: Secondary | ICD-10-CM | POA: Diagnosis not present

## 2016-01-24 DIAGNOSIS — S93692D Other sprain of left foot, subsequent encounter: Secondary | ICD-10-CM | POA: Diagnosis not present

## 2016-01-24 DIAGNOSIS — M79672 Pain in left foot: Secondary | ICD-10-CM | POA: Diagnosis not present

## 2016-02-01 DIAGNOSIS — M41126 Adolescent idiopathic scoliosis, lumbar region: Secondary | ICD-10-CM | POA: Diagnosis not present

## 2016-03-05 DIAGNOSIS — M48061 Spinal stenosis, lumbar region without neurogenic claudication: Secondary | ICD-10-CM | POA: Diagnosis not present

## 2016-03-05 DIAGNOSIS — M5136 Other intervertebral disc degeneration, lumbar region: Secondary | ICD-10-CM | POA: Diagnosis not present

## 2016-03-05 DIAGNOSIS — M5416 Radiculopathy, lumbar region: Secondary | ICD-10-CM | POA: Diagnosis not present

## 2016-03-05 DIAGNOSIS — Z981 Arthrodesis status: Secondary | ICD-10-CM | POA: Diagnosis not present

## 2016-03-05 DIAGNOSIS — M4726 Other spondylosis with radiculopathy, lumbar region: Secondary | ICD-10-CM | POA: Diagnosis not present

## 2016-04-06 ENCOUNTER — Ambulatory Visit (HOSPITAL_COMMUNITY)
Admission: EM | Admit: 2016-04-06 | Discharge: 2016-04-06 | Disposition: A | Discharge status: Home / Self Care | Payer: Medicare Other | Attending: Internal Medicine | Admitting: Internal Medicine

## 2016-04-06 ENCOUNTER — Encounter (HOSPITAL_COMMUNITY): Payer: Self-pay | Admitting: *Deleted

## 2016-04-06 DIAGNOSIS — R110 Nausea: Secondary | ICD-10-CM | POA: Diagnosis not present

## 2016-04-06 HISTORY — DX: Other chronic pain: G89.29

## 2016-04-06 HISTORY — DX: Dorsalgia, unspecified: M54.9

## 2016-04-06 MED ORDER — PROMETHAZINE HCL 25 MG PO TABS: 25.0000 mg | ORAL_TABLET | Freq: Four times a day (QID) | ORAL | 0 refills | Status: DC | PRN

## 2016-04-06 NOTE — ED Provider Notes (Signed)
CSN: [id number]     Arrival date & time 04/06/16  1544 History   First MD Initiated Contact with Patient 04/06/16 1805     Chief Complaint  Patient presents with  . Nausea  . Fatigue   (Consider location/radiation/quality/duration/timing/severity/associated sxs/prior Treatment) Patient is a well-appearing 36 y.o. Female, with history of Scoliosis, routinely gets injection in her Lumbar spine and SI joint every 4 months. She reports that she have been feeling "sick" with the last few injections where she would get headache, nausea and vomiting but symptoms will usually go away after 2 weeks. Patient got her last injection in her lumbar and SI joint on 01/09 (1 month ago) and started feeling "sick" with headache, nausea, and vomiting as expected but this time the symptoms persisted and has not gone away like the previous episodes have. She denies diarrhea, abdominal pain or fever. She took some zofran from her family members and reports the zofran has not helped.         Past Medical History:  Diagnosis Date  . Chronic back pain   . Depression   . Scoliosis    Past Surgical History:  Procedure Laterality Date  . BACK SURGERY     Harrington rods for scoliosis  . BTL    . CESAREAN SECTION    . ENDOMETRIAL ABLATION    . SPINE SURGERY  2013   fusion by Dr Danielle DessElsner, [facility]   Family History  Problem Relation Age of Onset  . Hypertension Father   . Diabetes Father   . Atrial fibrillation Father   . Down syndrome Sister   . ADD / ADHD Daughter   . Asthma Son   . ADD / ADHD Son    Social History  Substance Use Topics  . Smoking status: Never Smoker  . Smokeless tobacco: Not on file  . Alcohol use No   OB History    No data available     Review of Systems  Constitutional: Negative for chills, fatigue and fever.  HENT: Negative.   Eyes: Negative for visual disturbance.  Respiratory: Negative for cough and shortness of breath.   Cardiovascular: Negative  for chest pain and palpitations.  Gastrointestinal: Positive for nausea and vomiting. Negative for abdominal pain and diarrhea.  Genitourinary: Negative for dysuria, flank pain and urgency.  Neurological: Positive for headaches. Negative for dizziness.    Allergies  Patient has no known allergies.  Home Medications   Prior to Admission medications   Medication Sig Start Date End Date Taking? Authorizing Provider  Acetaminophen (TYLENOL PO) Take by mouth.   Yes Historical Provider, MD  HYDROCODONE BITARTRATE PO Take by mouth.   Yes Historical Provider, MD  cyclobenzaprine (FLEXERIL) 10 MG tablet Take 1 tablet (10 mg total) by mouth 2 (two) times daily as needed for muscle spasms. 09/18/15   Margarita Grizzleanielle Ray, MD  promethazine (PHENERGAN) 25 MG tablet Take 1 tablet (25 mg total) by mouth every 6 (six) hours as needed for nausea or vomiting. 04/06/16 04/11/16  Lucia EstelleFeng Josslyn Ciolek, NP   Meds Ordered and Administered this Visit  Medications - No data to display  BP 136/75   Pulse 80   Temp 98.2 F (36.8 C) (Oral)   Resp 16   SpO2 100%  No data found.   Physical Exam  Constitutional: She is oriented to person, place, and time. She appears well-developed and well-nourished.  HENT:  Head: Normocephalic and atraumatic.  Right Ear: External ear normal.  Left Ear: External ear normal.  Nose: Nose normal.  Mouth/Throat: Oropharynx is clear and moist. No oropharyngeal exudate.  TM pearly gray bilaterally with no erythema  Eyes: Conjunctivae are normal. Pupils are equal, round, and reactive to light.  Neck: Normal range of motion. Neck supple.  Cardiovascular: Normal rate, regular rhythm and normal heart sounds.   Pulmonary/Chest: Effort normal and breath sounds normal. No respiratory distress. She has no wheezes.  Abdominal: Soft. Bowel sounds are normal. She exhibits no distension. There is no tenderness.  Lymphadenopathy:    She has no cervical adenopathy.  Neurological: She is alert and oriented  to person, place, and time. No cranial nerve deficit. Coordination normal.  Skin: Skin is warm and dry.  Nursing note and vitals reviewed.   Urgent Care Course     Procedures (including critical care time)  Labs Review Labs Reviewed - No data to display  Imaging Review No results found.   MDM   1. Nausea    Physical examination unremarkable; neurologically intact. No clear etiology for her nausea or headache but suspecting that it may be related to the epidural injection that she got.   She declines Toradol for her headache today; stating headache is tolerable with ibuprofen. RX for phenergan given for nausea. Suggested to see PCP next week if nausea persist. Also suggested to f/u with her neurologist who performed injection ASAP for a f/u.   Discharge paperwork given; all questions answered.     Lucia Estelle, NP 04/06/16 [phone]

## 2016-04-06 NOTE — ED Triage Notes (Signed)
C/O general malaise, fatigue and nausea x few weeks.  States whenever she tries to eat something she feels sick.  Denies diarrhea; denies fevers.

## 2016-04-08 ENCOUNTER — Encounter: Payer: Self-pay | Admitting: Student

## 2016-04-08 ENCOUNTER — Ambulatory Visit (INDEPENDENT_AMBULATORY_CARE_PROVIDER_SITE_OTHER): Payer: Medicare Other | Admitting: Student

## 2016-04-08 VITALS — BP 118/78 | HR 88 | Temp 98.2°F | Ht 64.0 in | Wt 223.8 lb

## 2016-04-08 DIAGNOSIS — K5900 Constipation, unspecified: Secondary | ICD-10-CM | POA: Diagnosis not present

## 2016-04-08 DIAGNOSIS — R1031 Right lower quadrant pain: Secondary | ICD-10-CM | POA: Diagnosis not present

## 2016-04-08 DIAGNOSIS — R300 Dysuria: Secondary | ICD-10-CM

## 2016-04-08 DIAGNOSIS — R112 Nausea with vomiting, unspecified: Secondary | ICD-10-CM

## 2016-04-08 DIAGNOSIS — Z23 Encounter for immunization: Secondary | ICD-10-CM

## 2016-04-08 LAB — COMPLETE METABOLIC PANEL WITH GFR
ALT: 17 U/L (ref 6–29)
AST: 15 U/L (ref 10–30)
Albumin: 3.8 g/dL (ref 3.6–5.1)
Alkaline Phosphatase: 60 U/L (ref 33–115)
BUN: 22 mg/dL (ref 7–25)
CHLORIDE: 105 mmol/L (ref 98–110)
CO2: 27 mmol/L (ref 20–31)
CREATININE: 0.86 mg/dL (ref 0.50–1.10)
Calcium: 8.7 mg/dL (ref 8.6–10.2)
GFR, Est Non African American: 87 mL/min (ref >=60)
Glucose, Bld: 84 mg/dL (ref 65–99)
Potassium: 4.6 mmol/L (ref 3.5–5.3)
Sodium: 138 mmol/L (ref 135–146)
Total Bilirubin: 0.2 mg/dL (ref 0.2–1.2)
Total Protein: 6.5 g/dL (ref 6.1–8.1)

## 2016-04-08 LAB — POCT UA - MICROSCOPIC ONLY

## 2016-04-08 LAB — CBC WITH DIFFERENTIAL/PLATELET
BASOS PCT: 0 %
Basophils Absolute: 0 cells/uL (ref 0–200)
EOS ABS: 59 {cells}/uL (ref 15–500)
Eosinophils Relative: 1 %
HEMATOCRIT: 41.7 % (ref 35.0–45.0)
HEMOGLOBIN: 13.3 g/dL (ref 11.7–15.5)
LYMPHS ABS: 1652 {cells}/uL (ref 850–3900)
Lymphocytes Relative: 28 %
MCH: 29.6 pg (ref 27.0–33.0)
MCHC: 31.9 g/dL — ABNORMAL LOW (ref 32.0–36.0)
MCV: 92.7 fL (ref 80.0–100.0)
MONO ABS: 472 {cells}/uL (ref 200–950)
MPV: 10.4 fL (ref 7.5–12.5)
Monocytes Relative: 8 %
NEUTROS ABS: 3717 {cells}/uL (ref 1500–7800)
Neutrophils Relative %: 63 %
Platelets: 223 10*3/uL (ref 140–400)
RBC: 4.5 MIL/uL (ref 3.80–5.10)
RDW: 12.7 % (ref 11.0–15.0)
WBC: 5.9 10*3/uL (ref 3.8–10.8)

## 2016-04-08 LAB — POCT URINE PREGNANCY: PREG TEST UR: NEGATIVE

## 2016-04-08 LAB — POCT URINALYSIS DIPSTICK

## 2016-04-08 MED ORDER — METOCLOPRAMIDE HCL 5 MG PO TABS: 5.0000 mg | ORAL_TABLET | Freq: Three times a day (TID) | ORAL | 0 refills | Status: DC | PRN

## 2016-04-08 MED ORDER — POLYETHYLENE GLYCOL 3350 17 GM/SCOOP PO POWD: 17.0000 g | Freq: Every day | ORAL | 1 refills | Status: DC

## 2016-04-08 NOTE — Progress Notes (Signed)
Subjective:    Katie Ramirez is a 36 y.o. old female here for nausea  HPI Nausea: patient had steroid injection to her back for back pain by her neurosurgeon a month ago. A week later she developed nausea and headache. She reports nausea and headache after steroid injection in the past. However, this time symptoms started later than usual and the nausea is getting worse although the headache is improving. She went to UC two days ago and was given promethazine. She says this didn't help with nausea but made her dizzy. She also tried Zofran in the past which didn't help with nausea. She also reports emesis every time she tried to eat. Last emesis was last night. Content was food without blood. She denies diarrhea or cold like symptoms. She also reports abdominal pain over her RLQ. LBM was two days ago and dry. Denies blood in stool either.   She reports pressure like sensation with urination, urgency and increased frequency for one day. She denies dysuria or flank pain. She denies vaginal discharge, bleeding or itching. She is not sexually active. She also has [facility]. She denies dizziness, positional headache, vision changes, photophobia, phonophobia, neck stiffness, shortness of breath, chest pain, palpitation, focal weakness or numbness, myalgia.  She denies alcohol and drug use. She describes her mood as good also she looked flat throughout. Denies stress at home. She is not working.  PMH/Problem List: has Idiopathic scoliosis; Obesity; Lumbar pain; Depression; Headache; and Nausea with vomiting on her problem list.   has a past medical history of Chronic back pain; Depression; and Scoliosis.  FH:  Family History  Problem Relation Age of Onset  . Hypertension Father   . Diabetes Father   . Atrial fibrillation Father   . Down syndrome Sister   . ADD / ADHD Daughter   . Asthma Son   . ADD / ADHD Son     [facility] Social History  Substance Use Topics  . Smoking status: Never Smoker  . Smokeless  tobacco: Never Used  . Alcohol use No    Review of Systems Review of systems negative except for pertinent positives and negatives in history of present illness above.     Objective:     Vitals:   04/08/16 1046  BP: 118/78  Pulse: 88  Temp: 98.2 F (36.8 C)  TempSrc: Oral  SpO2: 98%  Weight: 223 lb 12.8 oz (101.5 kg)  Height: 5\' 4"  (1.626 m)    Physical Exam GEN: appears well, obese, no apparent distress. Head: normocephalic and atraumatic  Eyes: conjunctiva without injection, sclera anicteric Ears: external ear and ear canal normal Nares: no rhinorrhea, congestion or erythema Oropharynx: mmm without erythema or exudation HEM: negative for cervical or periauricular lymphadenopathies CVS: RRR, nl S1&S2, no murmurs, no edema,  cap refills < 2 secs RESP: speaks in full sentence, no IWOB, good air movement bilaterally, CTAB GI: BS present & normal, soft, tender to palpation over RLQ, rebound tenderness over RLQ with the pain shooting to her stomach, psoas sign negative, no tenderness elsewhere, no murphy sign, no palpable mass GU: no CVA tenderness MSK: no focal tenderness or notable swelling SKIN: no apparent skin lesion NEURO: alert and oiented appropriately, no gross defecits  PSYCH: flat affect    Assessment and Plan:  Nausea with vomiting Unclear cause. She has some UTI symptoms for one day which doesn't explain her nausea for three weeks. No positional headache and vision changes to thinks of increased intracranial pressure. Doubt meningitis.  Abdominal exam concerning for appendicitis. This could also be due to constipation in the absence of fever. Unlikely pyelo, pancreatitis, gall bladder etiology or obstruction. Will get CBC with diff, CMP, pgt, UA and urine culture. Gave prescription for Miralax for constipation. Gave prescription for Reglan for nausea as well. Will hold of abdominal CT pending those results. Overall, she looked well except for her flat affect, which  make me think if this is more of psychogenic issue in patient with history of depression and chronic pain. She is hemodynamically stable. Discussed patient with Dr. Deirdre Priest.   Orders Placed This Encounter  Procedures  . Urine culture  . Flu Vaccine QUAD 36+ mos IM  . COMPLETE METABOLIC PANEL WITH GFR  . CBC with Differential  . Urinalysis Dipstick  . POCT urine pregnancy  . POCT UA - Microscopic Only    Return if symptoms worsen or fail to improve.  Almon Hercules, MD 04/08/16 Pager: [phone]

## 2016-04-08 NOTE — Patient Instructions (Addendum)
It was great seeing you today! We have addressed the following issues today  1. Nausea/abdominal pain: We have done a urine test and a blood test today. Your urine test is not convincing for infection. We will order a urine culture. Someone will get in touch with you as soon as we get the results on the tests. Meanwhile, I recommend taking MiraLAX 1-2 times a day. I have also sent a prescription for Reglan to your pharmacy. Don't start taking this medication until I talk to you with your test results.  Please seek immediate care if you have worsening of the abdominal pain, not able to keep down anything at all, persistent fever, chills or other symptoms concerning to you   If we did any lab work today, and the results require attention, either me or my nurse will get in touch with you. If everything is normal, you will get a letter in mail. If you don't hear from us in two weeks, please give us a call. Otherwise, we look forward to seeing you again at your next visit. If you have any questions or concerns before then, please call the clinic at [phone].   Please bring all your medications to every doctors visit   Sign up for My Chart to have easy access to your labs results, and communication with your Primary care physician.     Please check-out at the front desk before leaving the clinic.    Take Care,

## 2016-04-08 NOTE — Assessment & Plan Note (Addendum)
Unclear cause. She has some UTI symptoms for one day which doesn't explain her nausea for three weeks. No positional headache and vision changes to thinks of increased intracranial pressure. Doubt meningitis. Abdominal exam concerning for appendicitis. This could also be due to constipation in the absence of fever. Unlikely pyelo, pancreatitis, gall bladder etiology or obstruction. Will get CBC with diff, CMP, pgt, UA and urine culture. Gave prescription for Miralax for constipation. Gave prescription for Reglan for nausea as well. Will hold of abdominal CT pending those results. Overall, she looked well except for her flat affect, which make me think if this is more of psychogenic issue in patient with history of depression and chronic pain. She is hemodynamically stable. Discussed patient with Dr. Deirdre Priesthambliss.

## 2016-04-09 LAB — URINE CULTURE: ORGANISM ID, BACTERIA: NO GROWTH

## 2016-04-10 ENCOUNTER — Encounter: Payer: Self-pay | Admitting: Student

## 2016-04-10 NOTE — Progress Notes (Signed)
Discussed with patient about her test results.  CMP, CBC with diff & Urine culture negative. Sent result letter. States she is better today. Started taking her Reglan which helped. Says she has an apt with her RN at neurosurgery for her back injection again soon.  Appreciated the call.

## 2016-04-11 DIAGNOSIS — M4120 Other idiopathic scoliosis, site unspecified: Secondary | ICD-10-CM | POA: Diagnosis not present

## 2016-04-11 DIAGNOSIS — R030 Elevated blood-pressure reading, without diagnosis of hypertension: Secondary | ICD-10-CM | POA: Diagnosis not present

## 2016-04-11 DIAGNOSIS — Z6841 Body Mass Index (BMI) 40.0 and over, adult: Secondary | ICD-10-CM | POA: Diagnosis not present

## 2016-05-07 ENCOUNTER — Encounter: Payer: Self-pay | Admitting: Family Medicine

## 2016-05-07 ENCOUNTER — Ambulatory Visit (INDEPENDENT_AMBULATORY_CARE_PROVIDER_SITE_OTHER): Payer: Medicare Other | Admitting: Family Medicine

## 2016-05-07 VITALS — BP 132/84 | HR 92 | Temp 97.7°F | Ht 64.0 in | Wt 232.8 lb

## 2016-05-07 DIAGNOSIS — B9789 Other viral agents as the cause of diseases classified elsewhere: Secondary | ICD-10-CM

## 2016-05-07 DIAGNOSIS — J069 Acute upper respiratory infection, unspecified: Secondary | ICD-10-CM | POA: Diagnosis present

## 2016-05-07 MED ORDER — DM-GUAIFENESIN ER 30-600 MG PO TB12: 1.0000 | ORAL_TABLET | Freq: Two times a day (BID) | ORAL | 0 refills | Status: DC | PRN

## 2016-05-07 NOTE — Patient Instructions (Signed)
Upper Respiratory Infection, Adult Most upper respiratory infections (URIs) are a viral infection of the air passages leading to the lungs. A URI affects the nose, throat, and upper air passages. The most common type of URI is nasopharyngitis and is typically referred to as "the common cold." URIs run their course and usually go away on their own. Most of the time, a URI does not require medical attention, but sometimes a bacterial infection in the upper airways can follow a viral infection. This is called a secondary infection. Sinus and middle ear infections are common types of secondary upper respiratory infections. Bacterial pneumonia can also complicate a URI. A URI can worsen asthma and chronic obstructive pulmonary disease (COPD). Sometimes, these complications can require emergency medical care and may be life threatening. What are the causes? Almost all URIs are caused by viruses. A virus is a type of germ and can spread from one person to another. What increases the risk? You may be at risk for a URI if:  You smoke.  You have chronic heart or lung disease.  You have a weakened defense (immune) system.  You are very young or very old.  You have nasal allergies or asthma.  You work in crowded or poorly ventilated areas.  You work in health care facilities or schools.  What are the signs or symptoms? Symptoms typically develop 2-3 days after you come in contact with a cold virus. Most viral URIs last 7-10 days. However, viral URIs from the influenza virus (flu virus) can last 14-18 days and are typically more severe. Symptoms may include:  Runny or stuffy (congested) nose.  Sneezing.  Cough.  Sore throat.  Headache.  Fatigue.  Fever.  Loss of appetite.  Pain in your forehead, behind your eyes, and over your cheekbones (sinus pain).  Muscle aches.  How is this diagnosed? Your health care provider may diagnose a URI by:  Physical exam.  Tests to check that your  symptoms are not due to another condition such as: ? Strep throat. ? Sinusitis. ? Pneumonia. ? Asthma.  How is this treated? A URI goes away on its own with time. It cannot be cured with medicines, but medicines may be prescribed or recommended to relieve symptoms. Medicines may help:  Reduce your fever.  Reduce your cough.  Relieve nasal congestion.  Follow these instructions at home:  Take medicines only as directed by your health care provider.  Gargle warm saltwater or take cough drops to comfort your throat as directed by your health care provider.  Use a warm mist humidifier or inhale steam from a shower to increase air moisture. This may make it easier to breathe.  Drink enough fluid to keep your urine clear or pale yellow.  Eat soups and other clear broths and maintain good nutrition.  Rest as needed.  Return to work when your temperature has returned to normal or as your health care provider advises. You may need to stay home longer to avoid infecting others. You can also use a face mask and careful hand washing to prevent spread of the virus.  Increase the usage of your inhaler if you have asthma.  Do not use any tobacco products, including cigarettes, chewing tobacco, or electronic cigarettes. If you need help quitting, ask your health care provider. How is this prevented? The best way to protect yourself from getting a cold is to practice good hygiene.  Avoid oral or hand contact with people with cold symptoms.  Wash your   hands often if contact occurs.  There is no clear evidence that vitamin C, vitamin E, echinacea, or exercise reduces the chance of developing a cold. However, it is always recommended to get plenty of rest, exercise, and practice good nutrition. Contact a health care provider if:  You are getting worse rather than better.  Your symptoms are not controlled by medicine.  You have chills.  You have worsening shortness of breath.  You have  brown or red mucus.  You have yellow or brown nasal discharge.  You have pain in your face, especially when you bend forward.  You have a fever.  You have swollen neck glands.  You have pain while swallowing.  You have white areas in the back of your throat. Get help right away if:  You have severe or persistent: ? Headache. ? Ear pain. ? Sinus pain. ? Chest pain.  You have chronic lung disease and any of the following: ? Wheezing. ? Prolonged cough. ? Coughing up blood. ? A change in your usual mucus.  You have a stiff neck.  You have changes in your: ? Vision. ? Hearing. ? Thinking. ? Mood. This information is not intended to replace advice given to you by your health care provider. Make sure you discuss any questions you have with your health care provider. Document Released: 08/07/2000 Document Revised: 10/15/2015 Document Reviewed: 05/19/2013 Elsevier Interactive Patient Education  2017 [employer]  

## 2016-05-08 NOTE — Progress Notes (Signed)
Subjective:     Patient ID: Katie Ramirez, female   DOB: [birth year], 36 y.o.   MRN: [id number]  HPI [name] is a 36yo female presenting today for cough. Reports 2 week history of cough and hoarseness. Denies congestion, fever, postnasal drip. Denies epigastric pain, substernal pain, and metallic taste in mouth. Denies wheezing or shortness of breath. Denies sneezing, rhinorrhea, and watery/itchy eyes. Denies history of allergies. Denies sick contacts. Worse at night. Has not tried any medications.  Review of Systems Per HPI    Objective:   Physical Exam  Constitutional: She appears well-developed and well-nourished. No distress.  HENT:  Mouth/Throat: Oropharynx is clear and moist. No oropharyngeal exudate.  Right and tympanic membrane normal with normal aeration of middle ear bilaterally  Cardiovascular: Normal rate and regular rhythm.   No murmur heard. Pulmonary/Chest: Effort normal. No respiratory distress. She has no wheezes.  Abdominal: Soft. She exhibits no distension. There is no tenderness.  Musculoskeletal: She exhibits no edema.  Skin: No rash noted.  Psychiatric: She has a normal mood and affect. Her behavior is normal.       Assessment and Plan:     1. Viral upper respiratory illness Suspect viral etiology. Recommend symptomatic treatment with Mucinex. Antibiotics not indicated. Follow up if symptoms worsen.

## 2016-05-09 DIAGNOSIS — M5441 Lumbago with sciatica, right side: Secondary | ICD-10-CM | POA: Diagnosis not present

## 2016-05-09 DIAGNOSIS — R109 Unspecified abdominal pain: Secondary | ICD-10-CM | POA: Diagnosis not present

## 2016-05-09 DIAGNOSIS — M5431 Sciatica, right side: Secondary | ICD-10-CM | POA: Diagnosis not present

## 2016-06-01 ENCOUNTER — Encounter: Payer: Self-pay | Admitting: Physician Assistant

## 2016-06-01 ENCOUNTER — Ambulatory Visit (INDEPENDENT_AMBULATORY_CARE_PROVIDER_SITE_OTHER): Payer: Medicare Other | Admitting: Physician Assistant

## 2016-06-01 VITALS — BP 137/92 | HR 78 | Temp 98.2°F | Resp 18 | Ht 64.0 in | Wt 231.0 lb

## 2016-06-01 DIAGNOSIS — J019 Acute sinusitis, unspecified: Secondary | ICD-10-CM

## 2016-06-01 MED ORDER — AMOXICILLIN-POT CLAVULANATE 875-125 MG PO TABS: 1.0000 | ORAL_TABLET | Freq: Two times a day (BID) | ORAL | 0 refills | Status: AC

## 2016-06-01 NOTE — Patient Instructions (Addendum)
Please take the antibiotic to completion.   If there is no improvement of your pain, increased swelling, fever--I need you to return.  We will likely order imaging.   Sinusitis, Adult Sinusitis is soreness and inflammation of your sinuses. Sinuses are hollow spaces in the bones around your face. They are located:  Around your eyes.  In the middle of your forehead.  Behind your nose.  In your cheekbones. Your sinuses and nasal passages are lined with a stringy fluid (mucus). Mucus normally drains out of your sinuses. When your nasal tissues get inflamed or swollen, the mucus can get trapped or blocked so air cannot flow through your sinuses. This lets bacteria, viruses, and funguses grow, and that leads to infection. Follow these instructions at home: Medicines   Take, use, or apply over-the-counter and prescription medicines only as told by your doctor. These may include nasal sprays.  If you were prescribed an antibiotic medicine, take it as told by your doctor. Do not stop taking the antibiotic even if you start to feel better. Hydrate and Humidify   Drink enough water to keep your pee (urine) clear or pale yellow.  Use a cool mist humidifier to keep the humidity level in your home above 50%.  Breathe in steam for 10-15 minutes, 3-4 times a day or as told by your doctor. You can do this in the bathroom while a hot shower is running.  Try not to spend time in cool or dry air. Rest   Rest as much as possible.  Sleep with your head raised (elevated).  Make sure to get enough sleep each night. General instructions   Put a warm, moist washcloth on your face 3-4 times a day or as told by your doctor. This will help with discomfort.  Wash your hands often with soap and water. If there is no soap and water, use hand sanitizer.  Do not smoke. Avoid being around people who are smoking (secondhand smoke).  Keep all follow-up visits as told by your doctor. This is  important. Contact a doctor if:  You have a fever.  Your symptoms get worse.  Your symptoms do not get better within 10 days. Get help right away if:  You have a very bad headache.  You cannot stop throwing up (vomiting).  You have pain or swelling around your face or eyes.  You have trouble seeing.  You feel confused.  Your neck is stiff.  You have trouble breathing. This information is not intended to replace advice given to you by your health care provider. Make sure you discuss any questions you have with your health care provider. Document Released: 07/31/2007 Document Revised: 10/08/2015 Document Reviewed: 12/07/2014 Elsevier Interactive Patient Education  2017 [employer].    IF you received an x-ray today, you will receive an invoice from [facility]. Please contact [facility] at [phone] with questions or concerns regarding your invoice.   IF you received labwork today, you will receive an invoice from [facility]. Please contact [facility] at [phone] with questions or concerns regarding your invoice.   Our billing staff will not be able to assist you with questions regarding bills from these companies.  You will be contacted with the lab results as soon as they are available. The fastest way to get your results is to activate your My Chart account. Instructions are located on the last page of this paperwork. If you have not heard from [place] regarding the results in 2 weeks, please contact  this office.

## 2016-06-02 NOTE — Progress Notes (Signed)
PRIMARY CARE AT [facility] [address] [place] [address] [phone]  Date:  06/01/2016   Name:  Katie Ramirez   DOB:  August 28, 1980   MRN:  [id number]  PCP:  Delynn Flavin, DO    History of Present Illness:  Katie Ramirez is a 36 y.o. female patient who presents to PCP with  Chief Complaint  Patient presents with  . Sinus Problem    After dental surgery, ear pain, jaw pain, left side     Patient reports left sided facial pain.  She reports symptoms initiated about 1 month ago with nasal congestion and cough.  She was then seen by her dentist for a dental pain.  She attempted to use amoxicillin she had at home for 3 doses, which improved her symptoms.  5 days ago, she went to her dentist for dental surgery at her left back molar.  Next day, she started to have increasing pain at her left nasal with extreme pain and congestion.  Pain radiates into teeth, which she states is prominent at the left lower side where the procedure was performed.  No fever.   She attempted Vicodin she had at home which did not help.  Patient Active Problem List   Diagnosis Date Noted  . Nausea with vomiting 04/08/2016  . Headache 06/30/2015  . Idiopathic scoliosis 02/08/2015  . Obesity 02/08/2015  . Lumbar pain 02/08/2015  . Depression 02/08/2015    Past Medical History:  Diagnosis Date  . Chronic back pain   . Depression   . Scoliosis     Past Surgical History:  Procedure Laterality Date  . BACK SURGERY     Harrington rods for scoliosis  . BTL    . CESAREAN SECTION    . ENDOMETRIAL ABLATION    . SPINE SURGERY  2013   fusion by Dr Danielle Dess, s Rod in childhood at [facility]    Social History  Substance Use Topics  . Smoking status: Never Smoker  . Smokeless tobacco: Never Used  . Alcohol use No    Family History  Problem Relation Age of Onset  . Hypertension Father   . Diabetes Father   . Atrial fibrillation Father   . Down syndrome Sister   . ADD / ADHD Daughter   . Asthma Son   .  ADD / ADHD Son     No Known Allergies  Medication list has been reviewed and updated.  Current Outpatient Prescriptions on File Prior to Visit  Medication Sig Dispense Refill  . HYDROCODONE BITARTRATE PO Take by mouth.    . promethazine (PHENERGAN) 25 MG tablet Take 1 tablet (25 mg total) by mouth every 6 (six) hours as needed for nausea or vomiting. 20 tablet 0   No current facility-administered medications on file prior to visit.     ROS ROS otherwise unremarkable unless listed above.  Physical Examination: BP (!) 137/92   Pulse 78   Temp 98.2 F (36.8 C) (Oral)   Resp 18   Ht  (1.626 m)   Wt 231 lb (104.8 kg)   SpO2 99%   BMI 39.65 kg/m  Ideal Body Weight: Weight in (lb) to have BMI = 25: 145.3  Physical Exam  Constitutional: She is oriented to person, place, and time. She appears well-developed and well-nourished. No distress.  HENT:  Head: Normocephalic and atraumatic.  Right Ear: Tympanic membrane, external ear and ear canal normal.  Left Ear: Tympanic membrane, external ear and ear canal normal.  Nose:  Mucosal edema (left nasal turbinates) and rhinorrhea (left nasal) present. Right sinus exhibits no maxillary sinus tenderness and no frontal sinus tenderness. Left sinus exhibits maxillary sinus tenderness (mild swelling at the nasal bridge.). Left sinus exhibits no frontal sinus tenderness.  Mouth/Throat: No uvula swelling. No oropharyngeal exudate, posterior oropharyngeal edema or posterior oropharyngeal erythema.  Eyes: Conjunctivae and EOM are normal. Pupils are equal, round, and reactive to light.  Cardiovascular: Normal rate and regular rhythm.  Exam reveals no gallop, no distant heart sounds and no friction rub.   No murmur heard. Pulmonary/Chest: Effort normal. No respiratory distress. She has no decreased breath sounds. She has no wheezes. She has no rhonchi.  Lymphadenopathy:       Head (right side): No submandibular, no tonsillar, no preauricular and  no posterior auricular adenopathy present.       Head (left side): No submandibular, no tonsillar, no preauricular and no posterior auricular adenopathy present.  Neurological: She is alert and oriented to person, place, and time.  Skin: She is not diaphoretic.  Psychiatric: She has a normal mood and affect. Her behavior is normal.     Assessment and Plan: Katie Ramirez is a 36 y.o. female who is here today for cc of facial pain and nasal congestion. She will rtc if no improvement within 48 hours.  May require imaging.  Will treat for sinus infection.  Advised warm compresses   1. Subacute sinusitis, unspecified location - amoxicillin-clavulanate (AUGMENTIN) 875-125 MG tablet; Take 1 tablet by mouth 2 (two) times daily.  Dispense: 20 tablet; Refill: 0   Trena Platt, PA-C [facility] 06/02/2016 9:19 PM

## 2016-06-05 DIAGNOSIS — M419 Scoliosis, unspecified: Secondary | ICD-10-CM | POA: Diagnosis not present

## 2016-06-05 DIAGNOSIS — M4185 Other forms of scoliosis, thoracolumbar region: Secondary | ICD-10-CM | POA: Diagnosis not present

## 2016-06-05 DIAGNOSIS — Z9689 Presence of other specified functional implants: Secondary | ICD-10-CM | POA: Diagnosis not present

## 2016-06-05 DIAGNOSIS — M4186 Other forms of scoliosis, lumbar region: Secondary | ICD-10-CM | POA: Diagnosis not present

## 2016-06-05 DIAGNOSIS — M5136 Other intervertebral disc degeneration, lumbar region: Secondary | ICD-10-CM | POA: Diagnosis not present

## 2016-06-05 DIAGNOSIS — Z9889 Other specified postprocedural states: Secondary | ICD-10-CM | POA: Diagnosis not present

## 2016-06-05 DIAGNOSIS — Z981 Arthrodesis status: Secondary | ICD-10-CM | POA: Diagnosis not present

## 2016-06-05 DIAGNOSIS — M47816 Spondylosis without myelopathy or radiculopathy, lumbar region: Secondary | ICD-10-CM | POA: Diagnosis not present

## 2016-06-05 DIAGNOSIS — M5386 Other specified dorsopathies, lumbar region: Secondary | ICD-10-CM | POA: Diagnosis not present

## 2016-06-05 DIAGNOSIS — M48061 Spinal stenosis, lumbar region without neurogenic claudication: Secondary | ICD-10-CM | POA: Diagnosis not present

## 2016-06-12 DIAGNOSIS — M4310 Spondylolisthesis, site unspecified: Secondary | ICD-10-CM | POA: Insufficient documentation

## 2016-07-09 DIAGNOSIS — M545 Low back pain: Secondary | ICD-10-CM | POA: Diagnosis not present

## 2016-07-17 ENCOUNTER — Ambulatory Visit (INDEPENDENT_AMBULATORY_CARE_PROVIDER_SITE_OTHER): Payer: Medicare Other | Admitting: Orthopedic Surgery

## 2016-08-05 DIAGNOSIS — M419 Scoliosis, unspecified: Secondary | ICD-10-CM | POA: Diagnosis not present

## 2016-08-05 DIAGNOSIS — M439 Deforming dorsopathy, unspecified: Secondary | ICD-10-CM | POA: Diagnosis not present

## 2016-08-05 DIAGNOSIS — Z981 Arthrodesis status: Secondary | ICD-10-CM | POA: Diagnosis not present

## 2016-08-05 DIAGNOSIS — G8929 Other chronic pain: Secondary | ICD-10-CM | POA: Insufficient documentation

## 2016-08-05 DIAGNOSIS — Z01818 Encounter for other preprocedural examination: Secondary | ICD-10-CM | POA: Diagnosis not present

## 2016-08-12 DIAGNOSIS — M41127 Adolescent idiopathic scoliosis, lumbosacral region: Secondary | ICD-10-CM | POA: Diagnosis present

## 2016-08-12 DIAGNOSIS — M8588 Other specified disorders of bone density and structure, other site: Secondary | ICD-10-CM | POA: Diagnosis not present

## 2016-08-12 DIAGNOSIS — M48061 Spinal stenosis, lumbar region without neurogenic claudication: Secondary | ICD-10-CM | POA: Diagnosis not present

## 2016-08-12 DIAGNOSIS — Z472 Encounter for removal of internal fixation device: Secondary | ICD-10-CM | POA: Diagnosis not present

## 2016-08-12 DIAGNOSIS — M4310 Spondylolisthesis, site unspecified: Secondary | ICD-10-CM | POA: Diagnosis present

## 2016-08-12 DIAGNOSIS — D62 Acute posthemorrhagic anemia: Secondary | ICD-10-CM | POA: Diagnosis not present

## 2016-08-12 DIAGNOSIS — M41117 Juvenile idiopathic scoliosis, lumbosacral region: Secondary | ICD-10-CM | POA: Diagnosis not present

## 2016-08-12 DIAGNOSIS — M4186 Other forms of scoliosis, lumbar region: Secondary | ICD-10-CM | POA: Diagnosis not present

## 2016-08-12 DIAGNOSIS — M4317 Spondylolisthesis, lumbosacral region: Secondary | ICD-10-CM | POA: Diagnosis not present

## 2016-08-12 DIAGNOSIS — Z6841 Body Mass Index (BMI) 40.0 and over, adult: Secondary | ICD-10-CM | POA: Diagnosis not present

## 2016-08-12 DIAGNOSIS — Z981 Arthrodesis status: Secondary | ICD-10-CM | POA: Diagnosis not present

## 2016-08-27 DIAGNOSIS — Z981 Arthrodesis status: Secondary | ICD-10-CM | POA: Diagnosis not present

## 2016-08-27 DIAGNOSIS — Z4789 Encounter for other orthopedic aftercare: Secondary | ICD-10-CM | POA: Diagnosis not present

## 2016-09-02 DIAGNOSIS — R1084 Generalized abdominal pain: Secondary | ICD-10-CM | POA: Diagnosis not present

## 2016-09-10 DIAGNOSIS — M545 Low back pain: Secondary | ICD-10-CM | POA: Diagnosis not present

## 2016-09-10 DIAGNOSIS — Z981 Arthrodesis status: Secondary | ICD-10-CM | POA: Diagnosis not present

## 2016-09-10 DIAGNOSIS — M25551 Pain in right hip: Secondary | ICD-10-CM | POA: Diagnosis not present

## 2016-09-10 DIAGNOSIS — M8580 Other specified disorders of bone density and structure, unspecified site: Secondary | ICD-10-CM | POA: Diagnosis not present

## 2016-09-10 DIAGNOSIS — M4310 Spondylolisthesis, site unspecified: Secondary | ICD-10-CM | POA: Diagnosis not present

## 2016-09-10 DIAGNOSIS — R200 Anesthesia of skin: Secondary | ICD-10-CM | POA: Diagnosis not present

## 2016-09-10 DIAGNOSIS — M4180 Other forms of scoliosis, site unspecified: Secondary | ICD-10-CM | POA: Diagnosis not present

## 2016-09-10 DIAGNOSIS — M4185 Other forms of scoliosis, thoracolumbar region: Secondary | ICD-10-CM | POA: Diagnosis not present

## 2016-09-10 DIAGNOSIS — M4126 Other idiopathic scoliosis, lumbar region: Secondary | ICD-10-CM | POA: Diagnosis not present

## 2016-09-10 DIAGNOSIS — M5441 Lumbago with sciatica, right side: Secondary | ICD-10-CM | POA: Diagnosis not present

## 2016-09-10 DIAGNOSIS — Z9889 Other specified postprocedural states: Secondary | ICD-10-CM | POA: Diagnosis not present

## 2016-09-11 DIAGNOSIS — Z981 Arthrodesis status: Secondary | ICD-10-CM | POA: Diagnosis not present

## 2016-09-11 DIAGNOSIS — M545 Low back pain: Secondary | ICD-10-CM | POA: Diagnosis not present

## 2016-09-11 DIAGNOSIS — M25551 Pain in right hip: Secondary | ICD-10-CM | POA: Diagnosis not present

## 2016-09-17 DIAGNOSIS — G629 Polyneuropathy, unspecified: Secondary | ICD-10-CM | POA: Diagnosis not present

## 2016-09-17 DIAGNOSIS — Z981 Arthrodesis status: Secondary | ICD-10-CM | POA: Diagnosis not present

## 2016-09-24 ENCOUNTER — Ambulatory Visit (HOSPITAL_COMMUNITY)
Admission: RE | Admit: 2016-09-24 | Discharge: 2016-09-24 | Disposition: A | Discharge status: Home / Self Care | Payer: Medicare Other | Source: Ambulatory Visit | Attending: Family Medicine | Admitting: Family Medicine

## 2016-09-24 ENCOUNTER — Ambulatory Visit (INDEPENDENT_AMBULATORY_CARE_PROVIDER_SITE_OTHER): Payer: Medicare Other | Admitting: Family Medicine

## 2016-09-24 ENCOUNTER — Encounter: Payer: Self-pay | Admitting: Family Medicine

## 2016-09-24 VITALS — BP 119/77 | HR 86 | Temp 97.9°F | Resp 16 | Ht 64.0 in | Wt 247.8 lb

## 2016-09-24 DIAGNOSIS — R1031 Right lower quadrant pain: Secondary | ICD-10-CM | POA: Insufficient documentation

## 2016-09-24 LAB — POCT URINALYSIS DIP (MANUAL ENTRY)
BILIRUBIN UA: NEGATIVE
BILIRUBIN UA: NEGATIVE mg/dL
Blood, UA: NEGATIVE
Glucose, UA: NEGATIVE mg/dL
LEUKOCYTES UA: NEGATIVE
NITRITE UA: NEGATIVE
Protein Ur, POC: NEGATIVE mg/dL
Spec Grav, UA: >=1.03 — AB (ref 1.010–1.025)
Urobilinogen, UA: 0.2 E.U./dL
pH, UA: 5.5 (ref 5.0–8.0)

## 2016-09-24 LAB — POCT CBC
Granulocyte percent: 61 %G (ref 37–80)
HEMATOCRIT: 31.2 % — AB (ref 37.7–47.9)
Hemoglobin: 10.1 g/dL — AB (ref 12.2–16.2)
LYMPH, POC: 2.4 (ref 0.6–3.4)
MCH: 27 pg (ref 27–31.2)
MCHC: 32.4 g/dL (ref 31.8–35.4)
MCV: 83.3 fL (ref 80–97)
MID (CBC): 0.6 (ref 0–0.9)
MPV: 8.1 fL (ref 0–99.8)
PLATELET COUNT, POC: 250 10*3/uL (ref 142–424)
POC GRANULOCYTE: 4.6 (ref 2–6.9)
POC LYMPH %: 31 % (ref 10–50)
POC MID %: 8 %M (ref 0–12)
RBC: 3.75 M/uL — AB (ref 4.04–5.48)
RDW, POC: 14.3 %
WBC: 7.6 10*3/uL (ref 4.6–10.2)

## 2016-09-24 IMAGING — CT CT ABD-PELV W/ CM
2 of 4 series · 16 of 46 positions shown, 18 images · IV contrast (APPLIED)
Comparison: None.

CLINICAL DATA: Right lower quadrant abdominal pain for several
days.

EXAM:
CT ABDOMEN AND PELVIS WITH CONTRAST
TECHNIQUE: Multidetector CT imaging of the abdomen and pelvis was performed
using the standard protocol following bolus administration of
intravenous contrast.
CONTRAST:  30mL [D9] IOPAMIDOL ([D9]) INJECTION 61%,
100mL [D9] IOPAMIDOL ([D9]) INJECTION 61%

[Series 2: axial st · axial · 0.89mm/px · z∈[+1020,+1420]mm · 13 of 91 slices shown, 15 images]
[im 6/91  soft-tissue]
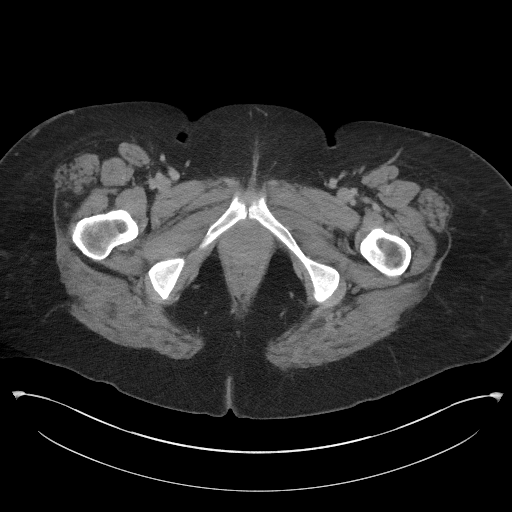
[im 6/91  bone]
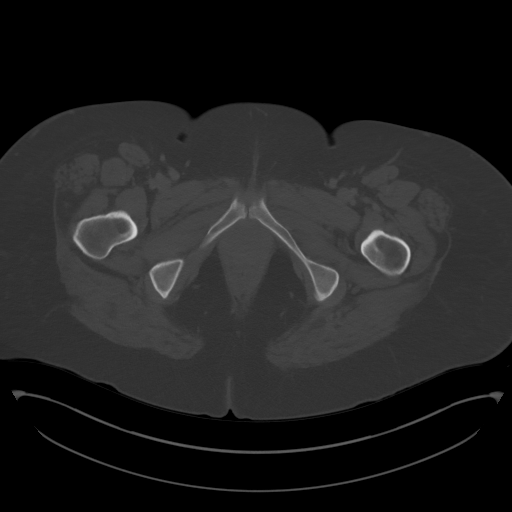
[im 11/91  soft-tissue]
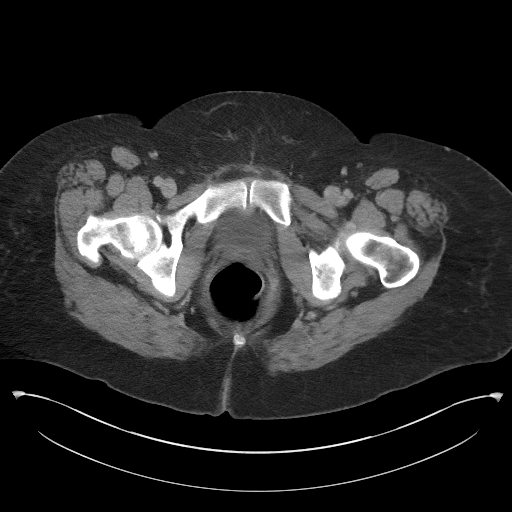
[im 21/91  soft-tissue]
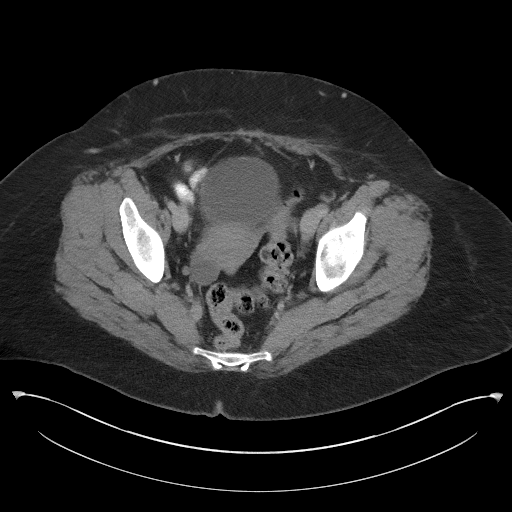
[im 26/91  soft-tissue]
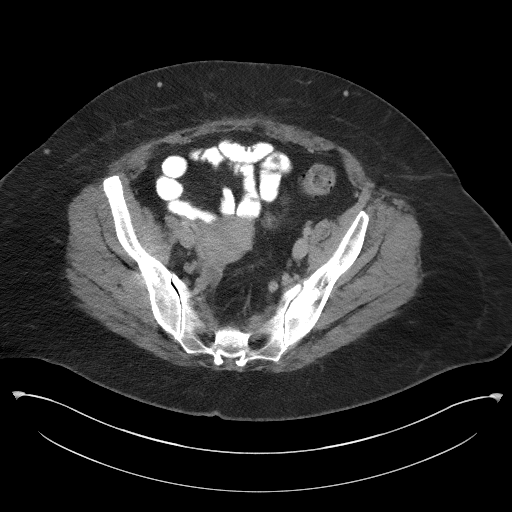
[im 31/91  soft-tissue]
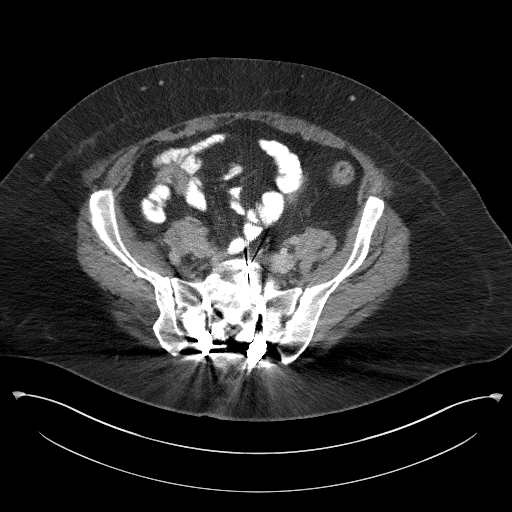
[im 41/91  soft-tissue]
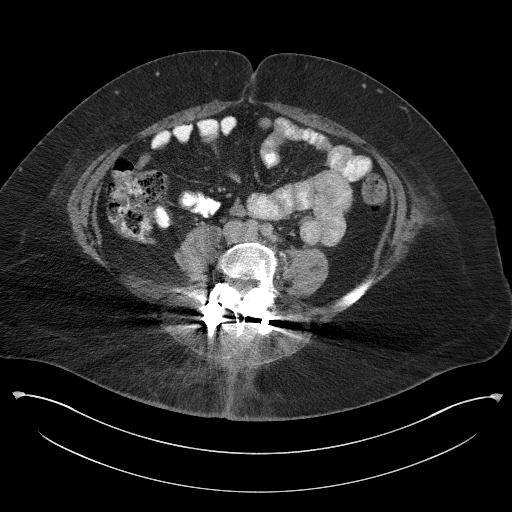
[im 46/91  soft-tissue]
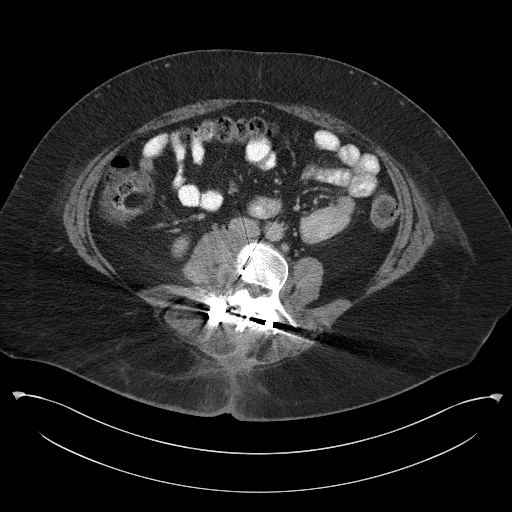
[im 51/91  soft-tissue]
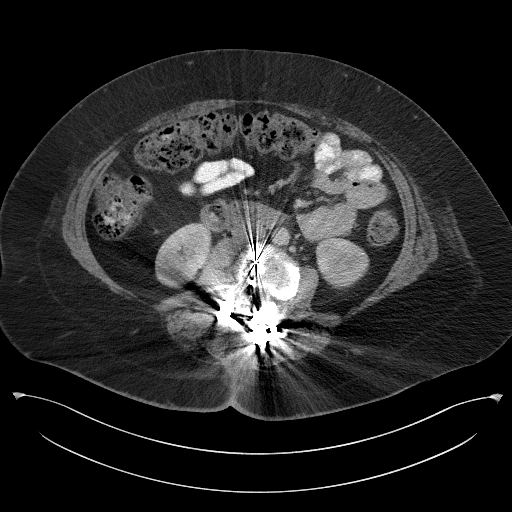
[im 61/91  soft-tissue]
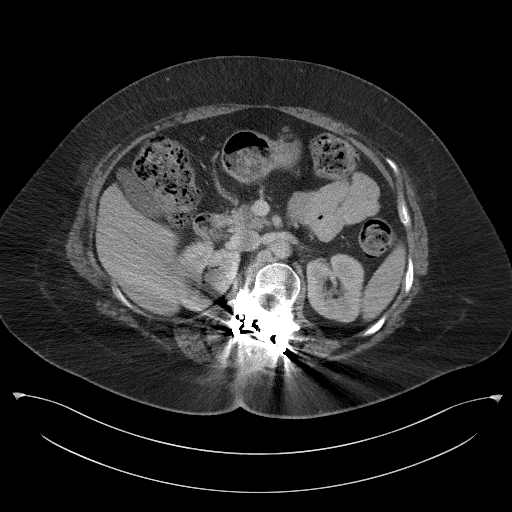
[im 61/91  bone]
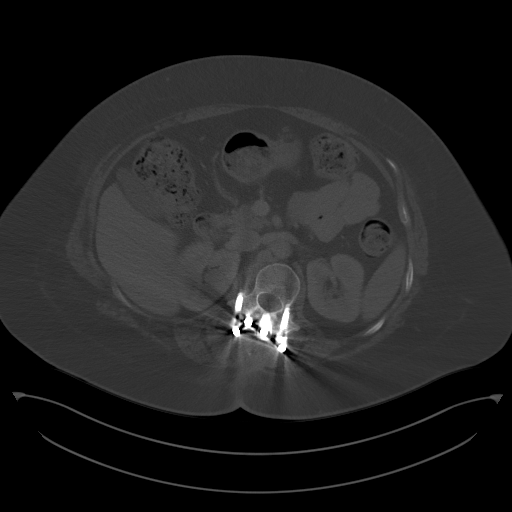
[im 66/91  soft-tissue]
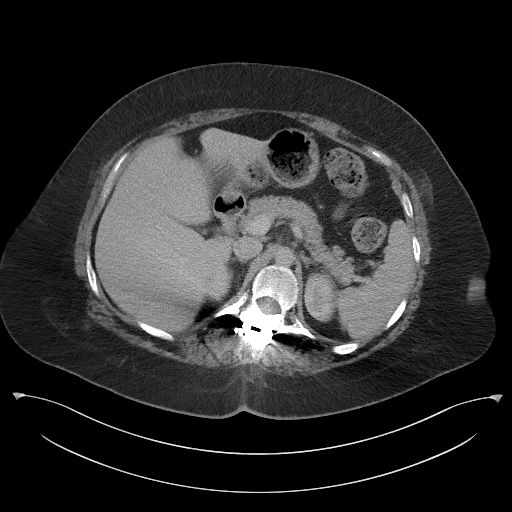
[im 71/91  soft-tissue]
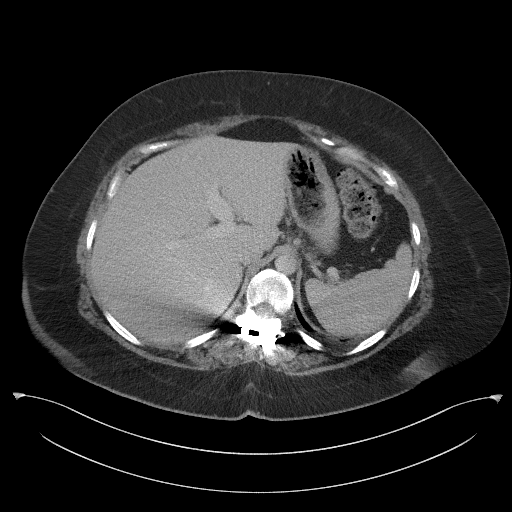
[im 81/91  soft-tissue]
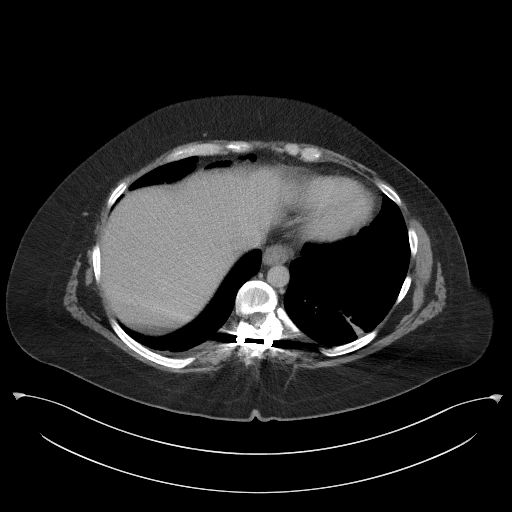
[im 86/91  soft-tissue]
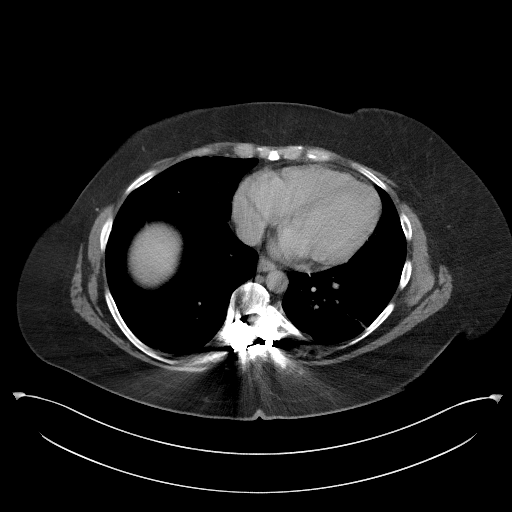

[Series 5: coronal st · coronal · 0.80mm/px · 3 of 101 slices shown]
[im 34/101  soft-tissue]
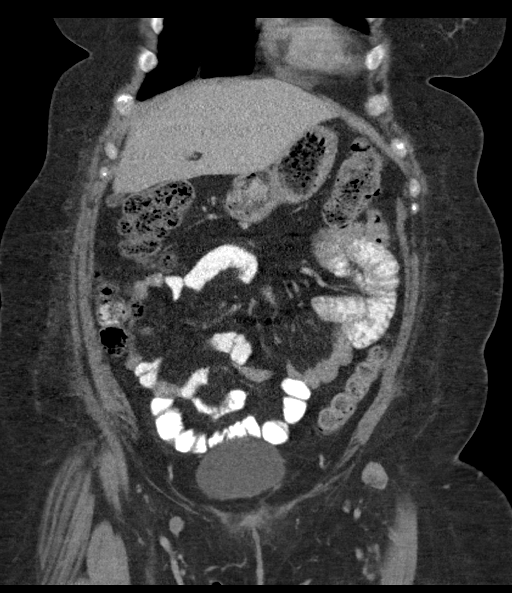
[im 45/101  soft-tissue]
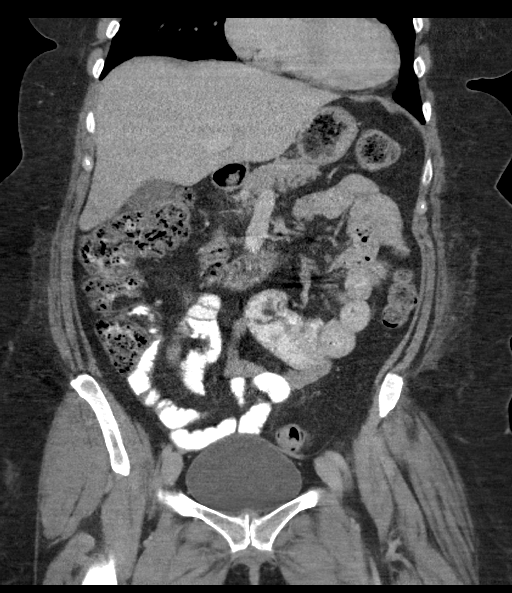
[im 56/101  soft-tissue]
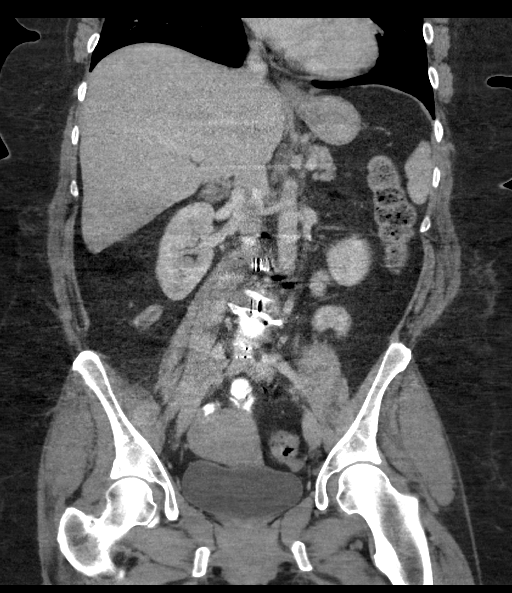

[16 of 46 positions shown; findings below may reference images not displayed]

FINDINGS: Lower chest: Mild atelectasis at the left lung base.

Hepatobiliary: No focal liver abnormality is seen. No gallstones,
gallbladder wall thickening, or biliary dilatation.

Pancreas: Unremarkable. No pancreatic ductal dilatation or
surrounding inflammatory changes.

Spleen: Normal in size without focal abnormality.

Adrenals/Urinary Tract: Adrenal glands are unremarkable. Kidneys are
normal, without renal calculi, focal lesion, or hydronephrosis.
Bladder is unremarkable.

Stomach/Bowel: Bowel shows no evidence of inflammation or
obstruction. The appendix is normal. Diverticulosis of the sigmoid
colon present without evidence of diverticulitis. No free air or
abscess.

Vascular/Lymphatic: No significant vascular findings are present. No
enlarged abdominal or pelvic lymph nodes.

Reproductive: Uterus and bilateral adnexa are unremarkable.

Other: No abdominal wall hernia or abnormality. No abdominopelvic
ascites.

Musculoskeletal: Extensive spinal fusion hardware present. At the L1
level, a right pedicle screw is malpositioned and lies lateral to
the pedicle and vertebral body. The left pedicle screw is
appropriately positioned. Similarly at the L2 level, the right
pedicle screw lies lateral to the pedicle and vertebral body.
Right-sided L1 and L2 pedicle screws course into the paraspinous
soft tissues and are not associated with hemorrhage. The left L2
pedicle screw is appropriately positioned at this level. L3, L4, L5
and S1 pedicle screws appear appropriately positioned. There are
also inferior fixation screws extending into the sacrum can crossing
the sacroiliac joints into the bony pelvis. Thoracic fusion rods are
also present that extend down to the L2 level.
IMPRESSION: 1. No acute inflammatory process in the abdomen or pelvis. No
evidence of appendicitis.
2. The right-sided pedicle screws at L1 and L2 lie lateral to the
pedicles and vertebral bodies with distal screws located in
paraspinal musculature. Screw malposition does not appear to be
associated with any hemorrhage.

## 2016-09-24 MED ORDER — IOPAMIDOL (ISOVUE-300) INJECTION 61%
INTRAVENOUS | Status: AC
  Filled 2016-09-24: qty 30

## 2016-09-24 MED ORDER — IOPAMIDOL (ISOVUE-300) INJECTION 61%
INTRAVENOUS | Status: AC
  Filled 2016-09-24: qty 100

## 2016-09-24 MED ORDER — IOPAMIDOL (ISOVUE-300) INJECTION 61%
30.0000 mL | Freq: Once | INTRAVENOUS | Status: AC | PRN
  Administered 2016-09-24: 30 mL via ORAL

## 2016-09-24 MED ORDER — IOPAMIDOL (ISOVUE-300) INJECTION 61%
100.0000 mL | Freq: Once | INTRAVENOUS | Status: AC | PRN
  Administered 2016-09-24: 100 mL via INTRAVENOUS

## 2016-09-24 NOTE — Progress Notes (Signed)
Report called to Dr Creta LevinStallings at 1632/ MD spoke with patient on the phone/ tsf [id number]

## 2016-09-24 NOTE — Progress Notes (Signed)
Chief Complaint  Patient presents with  . Abdominal Pain    onset: 2 weeks, stretching/turning body feels like abdomen is riping. Using heating pad and flexeril, lyrica and oxycodone and nothing is touching  the pain.    HPI  Pt reports that she has been having abdominal pain  She is postop from surgery for scoliosis She reports that her Neurosurgeon states that her abdominal pain is not due to her spine surgery and that her CT spine was showing normal hardware.    CT spine Lumbar 09/11/16 1.No evidence of acute fracture or traumatic malalignment involving the lumbar spine.  2.Postsurgical changes of posterior spinal fusion with hardware spanning from the imaged thoracic spine to the sacrum with both laminar hooks and pedicle screws as detailed above.  3.Similar alignment of hardware since prior imaging. Subsidence of interbody graft at L2-L3 into the superior endplate of L2.  4.Bony fusion across the imaged thoracic vertebrae, at the thoracolumbar junction and across L4-5. No osseous fusion noted at other levels of lumbar spine.   She reports that she has abdominal pain that feels like tearing or ripping for one week that is noted in her lower abdomen only on the right lower abdomen.  She statess that she uses a heating pad for pain She reports that she goes to the grocery store and feels intense pain from just walking in the store Her pain is a sharp tearing pain that is 10/10 by the end of the day Currently her pain is 7/10 She is urinating well She states that if she bears down she has pain  Past Medical History:  Diagnosis Date  . Chronic back pain   . Depression   . Scoliosis     Current Outpatient Prescriptions  Medication Sig Dispense Refill  . cyclobenzaprine (FLEXERIL) 10 MG tablet Take 10 mg by mouth once.    . diazepam (VALIUM) 5 MG tablet Take 5 mg by mouth every 6 (six) hours as needed for muscle spasms.    Marland Kitchen. HYDROCODONE BITARTRATE PO Take by mouth.    Marland Kitchen.  oxycodone (OXY-IR) 5 MG capsule Take 5 mg by mouth every 6 (six) hours as needed.    . pregabalin (LYRICA) 150 MG capsule Take 150 mg by mouth 2 (two) times daily.    Marland Kitchen. ibuprofen (ADVIL,MOTRIN) 200 MG tablet Take 200 mg by mouth every 6 (six) hours as needed.    . promethazine (PHENERGAN) 25 MG tablet Take 1 tablet (25 mg total) by mouth every 6 (six) hours as needed for nausea or vomiting. 20 tablet 0   No current facility-administered medications for this visit.    Facility-Administered Medications Ordered in Other Visits  Medication Dose Route Frequency Provider Last Rate Last Dose  . iopamidol (ISOVUE-300) 61 % injection             Allergies: No Known Allergies  Past Surgical History:  Procedure Laterality Date  . BACK SURGERY     Harrington rods for scoliosis  . BTL    . CESAREAN SECTION    . ENDOMETRIAL ABLATION    . SPINE SURGERY  2013   fusion by Dr Danielle DessElsner, s [facility] childhood [facility]    Social History   Social History  . Marital status: Single    Spouse name: N/A  . Number of children: N/A  . Years of education: N/A   Social History Main Topics  . Smoking status: Never Smoker  . Smokeless tobacco: Never Used  . Alcohol  use No  . Drug use: No  . Sexual activity: No   Other Topics Concern  . None   Social History Narrative  . None    ROS Review of Systems See HPI Constitution: No fevers or chills No malaise No diaphoresis Skin: No rash or itching Eyes: no blurry vision, no double vision GU: no dysuria or hematuria Neuro: no dizziness or headaches   Objective: Vitals:   09/24/16 1105  BP: 119/77  Pulse: 86  Resp: 16  Temp: 97.9 F (36.6 C)  TempSrc: Oral  SpO2: 99%  Weight: 247 lb 12.8 oz (112.4 kg)  Height: 5\' 4"  (1.626 m)    Physical Exam  Constitutional: She appears well-developed and well-nourished.  HENT:  Head: Normocephalic and atraumatic.  Eyes: Conjunctivae and EOM are normal.  Cardiovascular: Normal rate, regular rhythm and  normal heart sounds.   Pulmonary/Chest: Effort normal and breath sounds normal. No respiratory distress. She has no wheezes.  Abdominal: Soft. Normal appearance. Bowel sounds are decreased. There is no hepatosplenomegaly. There is tenderness in the right lower quadrant. There is rebound and tenderness at McBurney's point. There is no rigidity, no guarding, no CVA tenderness and negative Murphy's sign.   Component     Latest Ref Rng & Units 09/24/2016 09/24/2016        12:05 PM 12:05 PM  Color, UA     yellow yellow   Clarity, UA     clear clear   Glucose     negative mg/dL negative   Bilirubin, UA     negative negative negative  Specific Gravity, UA     1.010 - 1.025 >=1.030 (A)   RBC, UA     negative negative   pH, UA     5.0 - 8.0 5.5   Protein, UA     negative mg/dL negative   Urobilinogen, UA     0.2 or 1.0 E.U./dL 0.2   Nitrite, UA     Negative Negative   Leukocytes, UA     Negative Negative    Lab Results  Component Value Date   WBC 7.6 09/24/2016   HGB 10.1 (A) 09/24/2016   HCT 31.2 (A) 09/24/2016   MCV 83.3 09/24/2016   PLT 223 04/08/2016    Assessment and Plan [name] was seen today for abdominal pain.  Diagnoses and all orders for this visit:  Acute right lower quadrant pain -     POCT urinalysis dipstick -     POCT CBC -     CT Abdomen Pelvis W Contrast  since patient is s/p surgery and Body mass index is 42.53 kg/m. Would recommend stat ct abd with contrast  Reviewed ct spine and there was no comment on appendix Pt last ate at 10am  Discussed that ileus vs. Appendicitis are of concern  A total of 27 minutes were spent face-to-face with the patient during this encounter and over half of that time was spent on counseling and coordination of care.  Zoe A Stallings

## 2016-09-24 NOTE — Patient Instructions (Signed)
     IF you received an x-ray today, you will receive an invoice from [facility]. Please contact [facility] at [phone] with questions or concerns regarding your invoice.   IF you received labwork today, you will receive an invoice from [facility]. Please contact [facility] at [phone] with questions or concerns regarding your invoice.   Our billing staff will not be able to assist you with questions regarding bills from these companies.  You will be contacted with the lab results as soon as they are available. The fastest way to get your results is to activate your My Chart account. Instructions are located on the last page of this paperwork. If you have not heard from us regarding the results in 2 weeks, please contact this office.     

## 2016-10-30 DIAGNOSIS — Z981 Arthrodesis status: Secondary | ICD-10-CM | POA: Diagnosis not present

## 2016-10-30 DIAGNOSIS — Z4789 Encounter for other orthopedic aftercare: Secondary | ICD-10-CM | POA: Diagnosis not present

## 2016-10-30 DIAGNOSIS — Z9889 Other specified postprocedural states: Secondary | ICD-10-CM | POA: Diagnosis not present

## 2016-10-30 DIAGNOSIS — M4186 Other forms of scoliosis, lumbar region: Secondary | ICD-10-CM | POA: Diagnosis not present

## 2016-11-13 DIAGNOSIS — M545 Low back pain: Secondary | ICD-10-CM | POA: Diagnosis not present

## 2016-11-13 DIAGNOSIS — G894 Chronic pain syndrome: Secondary | ICD-10-CM | POA: Diagnosis not present

## 2016-11-13 DIAGNOSIS — M5417 Radiculopathy, lumbosacral region: Secondary | ICD-10-CM | POA: Diagnosis not present

## 2016-11-13 DIAGNOSIS — M4310 Spondylolisthesis, site unspecified: Secondary | ICD-10-CM | POA: Diagnosis not present

## 2016-11-13 DIAGNOSIS — M79662 Pain in left lower leg: Secondary | ICD-10-CM | POA: Diagnosis not present

## 2016-11-13 DIAGNOSIS — Z981 Arthrodesis status: Secondary | ICD-10-CM | POA: Diagnosis not present

## 2016-11-15 DIAGNOSIS — M545 Low back pain: Secondary | ICD-10-CM | POA: Diagnosis not present

## 2016-11-15 DIAGNOSIS — Z981 Arthrodesis status: Secondary | ICD-10-CM | POA: Diagnosis not present

## 2016-11-15 DIAGNOSIS — M4120 Other idiopathic scoliosis, site unspecified: Secondary | ICD-10-CM | POA: Diagnosis not present

## 2016-11-15 DIAGNOSIS — M5417 Radiculopathy, lumbosacral region: Secondary | ICD-10-CM | POA: Diagnosis not present

## 2016-11-15 DIAGNOSIS — Z79899 Other long term (current) drug therapy: Secondary | ICD-10-CM | POA: Diagnosis not present

## 2016-11-15 DIAGNOSIS — G894 Chronic pain syndrome: Secondary | ICD-10-CM | POA: Diagnosis not present

## 2016-11-27 DIAGNOSIS — M4310 Spondylolisthesis, site unspecified: Secondary | ICD-10-CM | POA: Diagnosis not present

## 2016-11-27 DIAGNOSIS — G894 Chronic pain syndrome: Secondary | ICD-10-CM | POA: Diagnosis not present

## 2016-11-27 DIAGNOSIS — Z981 Arthrodesis status: Secondary | ICD-10-CM | POA: Diagnosis not present

## 2016-12-02 DIAGNOSIS — M4310 Spondylolisthesis, site unspecified: Secondary | ICD-10-CM | POA: Diagnosis not present

## 2016-12-02 DIAGNOSIS — Z981 Arthrodesis status: Secondary | ICD-10-CM | POA: Diagnosis not present

## 2016-12-02 DIAGNOSIS — G894 Chronic pain syndrome: Secondary | ICD-10-CM | POA: Diagnosis not present

## 2016-12-13 DIAGNOSIS — Z8739 Personal history of other diseases of the musculoskeletal system and connective tissue: Secondary | ICD-10-CM | POA: Diagnosis not present

## 2016-12-13 DIAGNOSIS — Z981 Arthrodesis status: Secondary | ICD-10-CM | POA: Diagnosis not present

## 2016-12-13 DIAGNOSIS — Z9889 Other specified postprocedural states: Secondary | ICD-10-CM | POA: Diagnosis not present

## 2016-12-13 DIAGNOSIS — G894 Chronic pain syndrome: Secondary | ICD-10-CM | POA: Diagnosis not present

## 2016-12-13 DIAGNOSIS — Z6841 Body Mass Index (BMI) 40.0 and over, adult: Secondary | ICD-10-CM | POA: Diagnosis not present

## 2017-01-02 DIAGNOSIS — Z6841 Body Mass Index (BMI) 40.0 and over, adult: Secondary | ICD-10-CM | POA: Diagnosis not present

## 2017-01-02 DIAGNOSIS — M545 Low back pain: Secondary | ICD-10-CM | POA: Diagnosis not present

## 2017-01-02 DIAGNOSIS — G894 Chronic pain syndrome: Secondary | ICD-10-CM | POA: Diagnosis not present

## 2017-01-02 DIAGNOSIS — Z79891 Long term (current) use of opiate analgesic: Secondary | ICD-10-CM | POA: Diagnosis not present

## 2017-01-02 DIAGNOSIS — Z981 Arthrodesis status: Secondary | ICD-10-CM | POA: Diagnosis not present

## 2017-02-10 DIAGNOSIS — Z981 Arthrodesis status: Secondary | ICD-10-CM | POA: Diagnosis not present

## 2017-02-10 DIAGNOSIS — Z4789 Encounter for other orthopedic aftercare: Secondary | ICD-10-CM | POA: Diagnosis not present

## 2017-02-10 DIAGNOSIS — M4185 Other forms of scoliosis, thoracolumbar region: Secondary | ICD-10-CM | POA: Diagnosis not present

## 2017-02-10 DIAGNOSIS — Z9889 Other specified postprocedural states: Secondary | ICD-10-CM | POA: Diagnosis not present

## 2017-02-10 DIAGNOSIS — M4125 Other idiopathic scoliosis, thoracolumbar region: Secondary | ICD-10-CM | POA: Diagnosis not present

## 2017-02-10 DIAGNOSIS — M545 Low back pain: Secondary | ICD-10-CM | POA: Diagnosis not present

## 2017-02-12 DIAGNOSIS — G894 Chronic pain syndrome: Secondary | ICD-10-CM | POA: Diagnosis not present

## 2017-02-12 DIAGNOSIS — Z79891 Long term (current) use of opiate analgesic: Secondary | ICD-10-CM | POA: Diagnosis not present

## 2017-02-12 DIAGNOSIS — Z981 Arthrodesis status: Secondary | ICD-10-CM | POA: Diagnosis not present

## 2017-03-26 DIAGNOSIS — M545 Low back pain: Secondary | ICD-10-CM | POA: Diagnosis not present

## 2017-04-16 DIAGNOSIS — M40205 Unspecified kyphosis, thoracolumbar region: Secondary | ICD-10-CM | POA: Diagnosis not present

## 2017-04-16 DIAGNOSIS — T84498A Other mechanical complication of other internal orthopedic devices, implants and grafts, initial encounter: Secondary | ICD-10-CM | POA: Insufficient documentation

## 2017-04-16 DIAGNOSIS — M960 Pseudarthrosis after fusion or arthrodesis: Secondary | ICD-10-CM | POA: Diagnosis not present

## 2017-04-16 DIAGNOSIS — M545 Low back pain: Secondary | ICD-10-CM | POA: Diagnosis not present

## 2017-04-16 DIAGNOSIS — K5281 Eosinophilic gastritis or gastroenteritis: Secondary | ICD-10-CM | POA: Diagnosis not present

## 2017-04-16 DIAGNOSIS — Z981 Arthrodesis status: Secondary | ICD-10-CM | POA: Diagnosis not present

## 2017-04-16 DIAGNOSIS — T84226A Displacement of internal fixation device of vertebrae, initial encounter: Secondary | ICD-10-CM | POA: Diagnosis not present

## 2017-04-29 DIAGNOSIS — N3000 Acute cystitis without hematuria: Secondary | ICD-10-CM | POA: Diagnosis not present

## 2017-04-29 DIAGNOSIS — R300 Dysuria: Secondary | ICD-10-CM | POA: Diagnosis not present

## 2017-04-29 DIAGNOSIS — M545 Low back pain: Secondary | ICD-10-CM | POA: Diagnosis not present

## 2017-05-01 ENCOUNTER — Encounter: Payer: Self-pay | Admitting: Family Medicine

## 2017-05-01 ENCOUNTER — Ambulatory Visit (INDEPENDENT_AMBULATORY_CARE_PROVIDER_SITE_OTHER): Payer: Medicare Other | Admitting: Family Medicine

## 2017-05-01 ENCOUNTER — Other Ambulatory Visit: Payer: Self-pay

## 2017-05-01 VITALS — BP 124/84 | HR 67 | Temp 98.3°F | Wt 234.0 lb

## 2017-05-01 DIAGNOSIS — J069 Acute upper respiratory infection, unspecified: Secondary | ICD-10-CM | POA: Diagnosis not present

## 2017-05-01 MED ORDER — TRIAMCINOLONE ACETONIDE 55 MCG/ACT NA AERO: 2.0000 | INHALATION_SPRAY | Freq: Every day | NASAL | 12 refills | Status: DC

## 2017-05-01 MED ORDER — BENZONATATE 100 MG PO CAPS: 100.0000 mg | ORAL_CAPSULE | Freq: Three times a day (TID) | ORAL | 0 refills | Status: DC | PRN

## 2017-05-01 NOTE — Patient Instructions (Signed)
Upper Respiratory Infection, Adult Most upper respiratory infections (URIs) are caused by a virus. A URI affects the nose, throat, and upper air passages. The most common type of URI is often called "the common cold." Follow these instructions at home:  Take medicines only as told by your doctor.  Gargle warm saltwater or take cough drops to comfort your throat as told by your doctor.  Use a warm mist humidifier or inhale steam from a shower to increase air moisture. This may make it easier to breathe.  Drink enough fluid to keep your pee (urine) clear or pale yellow.  Eat soups and other clear broths.  Have a healthy diet.  Rest as needed.  Go back to work when your fever is gone or your doctor says it is okay. ? You may need to stay home longer to avoid giving your URI to others. ? You can also wear a face mask and wash your hands often to prevent spread of the virus.  Use your inhaler more if you have asthma.  Do not use any tobacco products, including cigarettes, chewing tobacco, or electronic cigarettes. If you need help quitting, ask your doctor. Contact a doctor if:  You are getting worse, not better.  Your symptoms are not helped by medicine.  You have chills.  You are getting more short of breath.  You have brown or red mucus.  You have yellow or brown discharge from your nose.  You have pain in your face, especially when you bend forward.  You have a fever.  You have puffy (swollen) neck glands.  You have pain while swallowing.  You have white areas in the back of your throat. Get help right away if:  You have very bad or constant: ? Headache. ? Ear pain. ? Pain in your forehead, behind your eyes, and over your cheekbones (sinus pain). ? Chest pain.  You have long-lasting (chronic) lung disease and any of the following: ? Wheezing. ? Long-lasting cough. ? Coughing up blood. ? A change in your usual mucus.  You have a stiff neck.  You have  changes in your: ? Vision. ? Hearing. ? Thinking. ? Mood. This information is not intended to replace advice given to you by your health care provider. Make sure you discuss any questions you have with your health care provider. Document Released: 07/31/2007 Document Revised: 10/15/2015 Document Reviewed: 05/19/2013 Elsevier Interactive Patient Education  2018 [employer]  

## 2017-05-01 NOTE — Progress Notes (Signed)
   Subjective:    Patient ID: Katie PulsChristy Ramirez is a 37 y.o. female presenting with No chief complaint on file.  on 05/01/2017  HPI: Symptoms began 4 days ago. Feels like she cannot breathe when she climbs stairs. No coughing. No fever, no chills. No asthma. + sick contacts. Taken Mucinex. Alka-seltzer cold and flu. Feels like cold is in her chest. Denies sore throat or runny nose.  Review of Systems  Constitutional: Negative for chills and fever.  Respiratory: Negative for shortness of breath.   Cardiovascular: Negative for chest pain.  Gastrointestinal: Negative for abdominal pain, nausea and vomiting.  Genitourinary: Negative for dysuria.  Skin: Negative for rash.      Objective:    Blood pressure 124/84, pulse 67, temperature 98.3 F (36.8 C), temperature source Oral, weight 234 lb (106.1 kg), SpO2 96 %.  Physical Exam  Constitutional: She is oriented to person, place, and time. She appears well-developed and well-nourished. No distress.  HENT:  Head: Normocephalic and atraumatic.  Nose: Mucosal edema and septal deviation present. Right sinus exhibits no maxillary sinus tenderness and no frontal sinus tenderness. Left sinus exhibits no maxillary sinus tenderness and no frontal sinus tenderness.  Eyes: No scleral icterus.  Neck: Neck supple.  Cardiovascular: Normal rate.  No murmur heard. Pulmonary/Chest: Effort normal and breath sounds normal. She has no wheezes. She has no rales.  Abdominal: Soft.  Neurological: She is alert and oriented to person, place, and time.  Skin: Skin is warm and dry.  Psychiatric: She has a normal mood and affect.        Assessment & Plan:   Problem List Items Addressed This Visit    None    Visit Diagnoses    Viral upper respiratory tract infection    -  Primary   symptomatic care--rx sent in--if no improvement, return as needed.   Relevant Medications   triamcinolone (NASACORT) 55 MCG/ACT AERO nasal inhaler   benzonatate (TESSALON PERLES)  100 MG capsule      Total face-to-face time with patient: 15 minutes. Over 50% of encounter was spent on counseling and coordination of care. Return if symptoms worsen or fail to improve.  Reva Boresanya S Beulah Capobianco 05/01/2017 3:01 PM

## 2017-05-02 ENCOUNTER — Encounter: Payer: Self-pay | Admitting: Family Medicine

## 2017-05-27 ENCOUNTER — Encounter: Payer: Self-pay | Admitting: Physician Assistant

## 2017-06-03 DIAGNOSIS — J029 Acute pharyngitis, unspecified: Secondary | ICD-10-CM | POA: Diagnosis not present

## 2017-06-03 DIAGNOSIS — M545 Low back pain: Secondary | ICD-10-CM | POA: Diagnosis not present

## 2017-06-10 DIAGNOSIS — M25511 Pain in right shoulder: Secondary | ICD-10-CM | POA: Diagnosis not present

## 2017-06-10 DIAGNOSIS — M542 Cervicalgia: Secondary | ICD-10-CM | POA: Diagnosis not present

## 2017-06-24 DIAGNOSIS — E7800 Pure hypercholesterolemia, unspecified: Secondary | ICD-10-CM | POA: Diagnosis not present

## 2017-06-24 DIAGNOSIS — E785 Hyperlipidemia, unspecified: Secondary | ICD-10-CM | POA: Diagnosis not present

## 2017-06-24 DIAGNOSIS — R1032 Left lower quadrant pain: Secondary | ICD-10-CM | POA: Diagnosis not present

## 2017-06-24 DIAGNOSIS — I70219 Atherosclerosis of native arteries of extremities with intermittent claudication, unspecified extremity: Secondary | ICD-10-CM | POA: Diagnosis not present

## 2017-06-24 DIAGNOSIS — Z1331 Encounter for screening for depression: Secondary | ICD-10-CM | POA: Diagnosis not present

## 2017-06-24 DIAGNOSIS — Z0000 Encounter for general adult medical examination without abnormal findings: Secondary | ICD-10-CM | POA: Diagnosis not present

## 2017-06-24 DIAGNOSIS — Z79899 Other long term (current) drug therapy: Secondary | ICD-10-CM | POA: Diagnosis not present

## 2017-06-24 DIAGNOSIS — R5383 Other fatigue: Secondary | ICD-10-CM | POA: Diagnosis not present

## 2017-06-24 DIAGNOSIS — M545 Low back pain: Secondary | ICD-10-CM | POA: Diagnosis not present

## 2017-06-24 DIAGNOSIS — Z131 Encounter for screening for diabetes mellitus: Secondary | ICD-10-CM | POA: Diagnosis not present

## 2017-06-24 DIAGNOSIS — Z1389 Encounter for screening for other disorder: Secondary | ICD-10-CM | POA: Diagnosis not present

## 2017-06-24 DIAGNOSIS — R002 Palpitations: Secondary | ICD-10-CM | POA: Diagnosis not present

## 2017-06-24 DIAGNOSIS — H9113 Presbycusis, bilateral: Secondary | ICD-10-CM | POA: Diagnosis not present

## 2017-06-24 DIAGNOSIS — R42 Dizziness and giddiness: Secondary | ICD-10-CM | POA: Diagnosis not present

## 2017-06-24 DIAGNOSIS — E039 Hypothyroidism, unspecified: Secondary | ICD-10-CM | POA: Diagnosis not present

## 2017-07-15 DIAGNOSIS — M25511 Pain in right shoulder: Secondary | ICD-10-CM | POA: Diagnosis not present

## 2017-07-15 DIAGNOSIS — M542 Cervicalgia: Secondary | ICD-10-CM | POA: Diagnosis not present

## 2017-07-17 DIAGNOSIS — M25511 Pain in right shoulder: Secondary | ICD-10-CM | POA: Diagnosis not present

## 2017-07-22 DIAGNOSIS — M545 Low back pain: Secondary | ICD-10-CM | POA: Diagnosis not present

## 2017-07-23 DIAGNOSIS — Z981 Arthrodesis status: Secondary | ICD-10-CM | POA: Diagnosis not present

## 2017-07-23 DIAGNOSIS — Z4789 Encounter for other orthopedic aftercare: Secondary | ICD-10-CM | POA: Diagnosis not present

## 2017-07-23 DIAGNOSIS — T84226A Displacement of internal fixation device of vertebrae, initial encounter: Secondary | ICD-10-CM | POA: Diagnosis not present

## 2017-07-23 DIAGNOSIS — M41114 Juvenile idiopathic scoliosis, thoracic region: Secondary | ICD-10-CM | POA: Diagnosis not present

## 2017-07-23 DIAGNOSIS — M960 Pseudarthrosis after fusion or arthrodesis: Secondary | ICD-10-CM | POA: Diagnosis not present

## 2017-08-14 DIAGNOSIS — M25562 Pain in left knee: Secondary | ICD-10-CM | POA: Diagnosis not present

## 2017-08-14 DIAGNOSIS — M25511 Pain in right shoulder: Secondary | ICD-10-CM | POA: Diagnosis not present

## 2017-08-14 DIAGNOSIS — Z981 Arthrodesis status: Secondary | ICD-10-CM | POA: Diagnosis not present

## 2017-08-14 DIAGNOSIS — M79605 Pain in left leg: Secondary | ICD-10-CM | POA: Diagnosis not present

## 2017-08-14 DIAGNOSIS — M419 Scoliosis, unspecified: Secondary | ICD-10-CM | POA: Diagnosis not present

## 2017-08-14 DIAGNOSIS — Z9889 Other specified postprocedural states: Secondary | ICD-10-CM | POA: Diagnosis not present

## 2017-08-14 DIAGNOSIS — M4800 Spinal stenosis, site unspecified: Secondary | ICD-10-CM | POA: Diagnosis not present

## 2017-08-14 DIAGNOSIS — M79601 Pain in right arm: Secondary | ICD-10-CM | POA: Diagnosis not present

## 2017-08-14 DIAGNOSIS — M4185 Other forms of scoliosis, thoracolumbar region: Secondary | ICD-10-CM | POA: Diagnosis not present

## 2017-08-14 DIAGNOSIS — R200 Anesthesia of skin: Secondary | ICD-10-CM | POA: Diagnosis not present

## 2017-08-19 DIAGNOSIS — M4826 Kissing spine, lumbar region: Secondary | ICD-10-CM | POA: Diagnosis not present

## 2017-09-11 DIAGNOSIS — Z981 Arthrodesis status: Secondary | ICD-10-CM | POA: Diagnosis not present

## 2017-09-11 DIAGNOSIS — G959 Disease of spinal cord, unspecified: Secondary | ICD-10-CM | POA: Diagnosis not present

## 2017-09-11 DIAGNOSIS — Z9889 Other specified postprocedural states: Secondary | ICD-10-CM | POA: Diagnosis not present

## 2017-09-15 DIAGNOSIS — M4306 Spondylolysis, lumbar region: Secondary | ICD-10-CM | POA: Diagnosis not present

## 2017-09-22 DIAGNOSIS — M5030 Other cervical disc degeneration, unspecified cervical region: Secondary | ICD-10-CM | POA: Diagnosis not present

## 2017-09-22 DIAGNOSIS — M419 Scoliosis, unspecified: Secondary | ICD-10-CM | POA: Diagnosis not present

## 2017-09-25 DIAGNOSIS — M25511 Pain in right shoulder: Secondary | ICD-10-CM | POA: Diagnosis not present

## 2017-10-14 DIAGNOSIS — M545 Low back pain: Secondary | ICD-10-CM | POA: Diagnosis not present

## 2017-10-14 DIAGNOSIS — Z23 Encounter for immunization: Secondary | ICD-10-CM | POA: Diagnosis not present

## 2017-10-28 DIAGNOSIS — M4316 Spondylolisthesis, lumbar region: Secondary | ICD-10-CM | POA: Diagnosis not present

## 2017-10-28 DIAGNOSIS — M545 Low back pain: Secondary | ICD-10-CM | POA: Diagnosis not present

## 2017-10-28 DIAGNOSIS — Z981 Arthrodesis status: Secondary | ICD-10-CM | POA: Diagnosis not present

## 2017-10-28 DIAGNOSIS — Z5181 Encounter for therapeutic drug level monitoring: Secondary | ICD-10-CM | POA: Diagnosis not present

## 2017-10-28 DIAGNOSIS — Z79899 Other long term (current) drug therapy: Secondary | ICD-10-CM | POA: Diagnosis not present

## 2017-10-28 DIAGNOSIS — M4696 Unspecified inflammatory spondylopathy, lumbar region: Secondary | ICD-10-CM | POA: Diagnosis not present

## 2017-10-28 DIAGNOSIS — M419 Scoliosis, unspecified: Secondary | ICD-10-CM | POA: Diagnosis not present

## 2017-10-28 DIAGNOSIS — G8929 Other chronic pain: Secondary | ICD-10-CM | POA: Diagnosis not present

## 2017-10-28 DIAGNOSIS — M25552 Pain in left hip: Secondary | ICD-10-CM | POA: Diagnosis not present

## 2017-10-29 DIAGNOSIS — M4696 Unspecified inflammatory spondylopathy, lumbar region: Secondary | ICD-10-CM | POA: Diagnosis not present

## 2017-10-29 DIAGNOSIS — M545 Low back pain: Secondary | ICD-10-CM | POA: Diagnosis not present

## 2017-10-29 DIAGNOSIS — M4316 Spondylolisthesis, lumbar region: Secondary | ICD-10-CM | POA: Diagnosis not present

## 2017-10-30 DIAGNOSIS — M5416 Radiculopathy, lumbar region: Secondary | ICD-10-CM | POA: Diagnosis not present

## 2017-10-30 DIAGNOSIS — M545 Low back pain: Secondary | ICD-10-CM | POA: Diagnosis not present

## 2017-11-10 DIAGNOSIS — M4306 Spondylolysis, lumbar region: Secondary | ICD-10-CM | POA: Diagnosis not present

## 2017-11-21 DIAGNOSIS — M542 Cervicalgia: Secondary | ICD-10-CM | POA: Diagnosis not present

## 2017-11-21 DIAGNOSIS — M5020 Other cervical disc displacement, unspecified cervical region: Secondary | ICD-10-CM | POA: Diagnosis not present

## 2017-12-02 DIAGNOSIS — Z6841 Body Mass Index (BMI) 40.0 and over, adult: Secondary | ICD-10-CM | POA: Diagnosis not present

## 2017-12-02 DIAGNOSIS — Z79891 Long term (current) use of opiate analgesic: Secondary | ICD-10-CM | POA: Diagnosis not present

## 2017-12-02 DIAGNOSIS — M50223 Other cervical disc displacement at C6-C7 level: Secondary | ICD-10-CM | POA: Diagnosis not present

## 2017-12-02 DIAGNOSIS — M50222 Other cervical disc displacement at C5-C6 level: Secondary | ICD-10-CM | POA: Diagnosis not present

## 2017-12-02 DIAGNOSIS — M5020 Other cervical disc displacement, unspecified cervical region: Secondary | ICD-10-CM | POA: Diagnosis not present

## 2017-12-02 DIAGNOSIS — Z981 Arthrodesis status: Secondary | ICD-10-CM | POA: Diagnosis not present

## 2017-12-02 DIAGNOSIS — M419 Scoliosis, unspecified: Secondary | ICD-10-CM | POA: Diagnosis present

## 2017-12-02 DIAGNOSIS — M4802 Spinal stenosis, cervical region: Secondary | ICD-10-CM | POA: Diagnosis present

## 2017-12-02 DIAGNOSIS — M50221 Other cervical disc displacement at C4-C5 level: Secondary | ICD-10-CM | POA: Diagnosis present

## 2017-12-02 HISTORY — PX: ANTERIOR CERVICAL DECOMP/DISCECTOMY FUSION: SHX1161

## 2017-12-10 DIAGNOSIS — G894 Chronic pain syndrome: Secondary | ICD-10-CM | POA: Diagnosis not present

## 2018-01-12 DIAGNOSIS — M545 Low back pain: Secondary | ICD-10-CM | POA: Diagnosis not present

## 2018-01-16 DIAGNOSIS — M5382 Other specified dorsopathies, cervical region: Secondary | ICD-10-CM | POA: Diagnosis not present

## 2018-01-16 DIAGNOSIS — M5020 Other cervical disc displacement, unspecified cervical region: Secondary | ICD-10-CM | POA: Diagnosis not present

## 2018-02-09 DIAGNOSIS — G894 Chronic pain syndrome: Secondary | ICD-10-CM | POA: Diagnosis not present

## 2018-09-03 ENCOUNTER — Other Ambulatory Visit: Payer: Self-pay | Admitting: Nurse Practitioner

## 2018-09-03 DIAGNOSIS — M542 Cervicalgia: Secondary | ICD-10-CM

## 2018-11-23 ENCOUNTER — Ambulatory Visit: Payer: Medicare Other

## 2018-11-23 ENCOUNTER — Ambulatory Visit (INDEPENDENT_AMBULATORY_CARE_PROVIDER_SITE_OTHER): Payer: Medicare Other | Admitting: Orthopaedic Surgery

## 2018-11-23 DIAGNOSIS — M7061 Trochanteric bursitis, right hip: Secondary | ICD-10-CM | POA: Diagnosis not present

## 2018-11-23 DIAGNOSIS — M25551 Pain in right hip: Secondary | ICD-10-CM

## 2018-11-23 MED ORDER — METHYLPREDNISOLONE ACETATE 40 MG/ML IJ SUSP
40.0000 mg | INTRAMUSCULAR | Status: AC | PRN
  Administered 2018-11-23: 11:00:00 40 mg via INTRA_ARTICULAR

## 2018-11-23 MED ORDER — LIDOCAINE HCL 1 % IJ SOLN
3.0000 mL | INTRAMUSCULAR | Status: AC | PRN
  Administered 2018-11-23: 11:00:00 3 mL

## 2018-11-23 NOTE — Progress Notes (Signed)
Office Visit Note   Patient: Katie Ramirez           Date of Birth: [birth year]           MRN: [id number] Visit Date: 11/23/2018              Requested by: Marthenia Rolling, DO [address] [place],  [place] [address] PCP: Marthenia Rolling, DO   Assessment & Plan: Visit Diagnoses:  1. Pain in right hip   2. Trochanteric bursitis of right hip     Plan: I do feel that this is trochanteric bursitis and IT band syndrome.  She would benefit from Voltaren gel on both these areas and I gave her a handout recommending over-the-counter Voltaren gel.  Also recommend a trochanteric injection with steroid and she agreed with this.  She tolerated that injection well.  She understands risk and benefits injections.  She will also definitely benefit from outpatient physical therapy to work on various modalities to hopefully decrease the pain over her trochanteric area and IT band and any modalities that the therapist can work on between stretching and other things such as ultrasound and iontophoresis that can help would be useful.  We will see her back in about 2 months to see how she is doing overall and can consider repeat injection then if needed.  All question concerns were answered and addressed.  Follow-Up Instructions: Return in about 2 months (around 01/23/2019).   Orders:  Orders Placed This Encounter  Procedures  . Large Joint Inj  . XR HIP UNILAT W OR W/O PELVIS 1V RIGHT   No orders of the defined types were placed in this encounter.     Procedures: Large Joint Inj: R greater trochanter on 11/23/2018 10:49 AM Indications: pain and diagnostic evaluation Details: 22 G 1.5 in needle, lateral approach  Arthrogram: No  Medications: 3 mL lidocaine 1 %; 40 mg methylPREDNISolone acetate 40 MG/ML Outcome: tolerated well, no immediate complications Procedure, treatment alternatives, risks and benefits explained, specific risks discussed. Consent was given by the patient. Immediately prior to  procedure a time out was called to verify the correct patient, procedure, equipment, support staff and site/side marked as required. Patient was prepped and draped in the usual sterile fashion.       Clinical Data: No additional findings.   Subjective: Chief Complaint  Patient presents with  . Right Hip - Pain  Patient is a very pleasant 38 year old female who comes in with acute right hip pain in the trochanteric area.  She has a complicated spine history in terms of a significant fusion from the upper spine down through the pelvis and into the sacrum.  This does start off her gait.  She denies any groin pain but is developed significant pain over the right hip area.  A prednisone taper through her regular physicians did not help.  She is in pain management.  She is not a type of therapy.  She is ambulating using a cane in her opposite hand.  She says pivoting to get out of her car causes right hip pain on the lateral aspect of her hip all the way down to the IT band is where she points.  She denies any numbness and tingling in her feet.  She denies any left hip pain.  She has not had any injury to this area or any surgery on her right hip.  She is not a diabetic.  HPI  Review of Systems She currently denies any  headache, chest pain, shortness of breath, fever, chills, nausea, vomiting  Objective: Vital Signs: There were no vitals taken for this visit.  Physical Exam She is alert and orient x3 and in no acute distress Ortho Exam Examination of her right lower extremity shows that her hip moves smoothly but she does have severe pain to palpation over the trochanteric area and the IT band.  Her hip exam is otherwise normal as is her knee exam. Specialty Comments:  No specialty comments available.  Imaging: Xr Hip Unilat W Or W/o Pelvis 1v Right  Result Date: 11/23/2018 An AP pelvis and lateral of the right hip shows normal-appearing hips bilaterally with excellent joint space and no  acute findings around either hip.    PMFS History: Patient Active Problem List   Diagnosis Date Noted  . Headache 06/30/2015  . Idiopathic scoliosis 02/08/2015  . Obesity 02/08/2015  . Lumbar pain 02/08/2015  . Depression 02/08/2015   Past Medical History:  Diagnosis Date  . Chronic back pain   . Depression   . Scoliosis     Family History  Problem Relation Age of Onset  . Hypertension Father   . Diabetes Father   . Atrial fibrillation Father   . Down syndrome Sister   . ADD / ADHD Daughter   . Asthma Son   . ADD / ADHD Son     Past Surgical History:  Procedure Laterality Date  . BACK SURGERY     Harrington rods for scoliosis  . BTL    . CESAREAN SECTION    . ENDOMETRIAL ABLATION    . [place] SURGERY  2013   fusion by Dr Ellene Route, s Rod in childhood at [place] History  . Not on file  Tobacco Use  . Smoking status: Never Smoker  . Smokeless tobacco: Never Used  Substance and Sexual Activity  . Alcohol use: No  . Drug use: No  . Sexual activity: Never

## 2018-11-24 ENCOUNTER — Other Ambulatory Visit: Payer: Self-pay

## 2018-11-24 DIAGNOSIS — M7061 Trochanteric bursitis, right hip: Secondary | ICD-10-CM

## 2018-11-25 ENCOUNTER — Telehealth: Payer: Self-pay | Admitting: Orthopaedic Surgery

## 2018-11-25 NOTE — Telephone Encounter (Signed)
LMOM of the below message  

## 2018-11-25 NOTE — Telephone Encounter (Signed)
See below

## 2018-11-25 NOTE — Telephone Encounter (Signed)
I certainly expect her to have considerable lateral pain given the severity of her pain at the time of her appointment yesterday.  We did provide a steroid injection over her trochanteric area and it usually will take several days before it has an effect in terms of fighting pain and inflammation.  She should get Voltaren gel over-the-counter to try on this area as well.  She can alternate ice and heat and not sleep on that side but there is nothing else that I would recommend.  Of note she is also in chronic pain management.

## 2018-11-25 NOTE — Telephone Encounter (Signed)
Patient called. Says she is still in a lot of pain. Her call back number is [phone]

## 2018-12-07 ENCOUNTER — Ambulatory Visit: Payer: Medicare Other | Attending: Orthopaedic Surgery | Admitting: Physical Therapy

## 2018-12-07 ENCOUNTER — Other Ambulatory Visit: Payer: Self-pay

## 2018-12-07 ENCOUNTER — Encounter: Payer: Self-pay | Admitting: Physical Therapy

## 2018-12-07 DIAGNOSIS — M6281 Muscle weakness (generalized): Secondary | ICD-10-CM

## 2018-12-07 DIAGNOSIS — M25551 Pain in right hip: Secondary | ICD-10-CM | POA: Insufficient documentation

## 2018-12-07 DIAGNOSIS — R262 Difficulty in walking, not elsewhere classified: Secondary | ICD-10-CM | POA: Diagnosis present

## 2018-12-07 NOTE — Therapy (Signed)
[facility] [address] [place], [place], [address] Phone: [phone]   Fax:  [phone]  Physical Therapy Evaluation  Patient Details  Name: Katie Ramirez MRN: [id number] Date of Birth: April 22, 1980 Referring Provider (PT): Doneen Poisson, MD   Encounter Date: 12/07/2018  PT End of Session - 12/07/18 2046    Visit Number  1    Number of Visits  12    Date for PT Re-Evaluation  01/18/19    Authorization Type  Medicare, progress note at visit 10 and KX by visit 15    PT Start Time  1415    PT Stop Time  1458    PT Time Calculation (min)  43 min    Activity Tolerance  Patient limited by pain    Behavior During Therapy  [facility] for tasks assessed/performed       Past Medical History:  Diagnosis Date  . Chronic back pain   . Depression   . Scoliosis     Past Surgical History:  Procedure Laterality Date  . ANTERIOR CERVICAL DECOMP/DISCECTOMY FUSION  12/02/2017  . BACK SURGERY     Harrington rods for scoliosis  . BTL    . CESAREAN SECTION    . ENDOMETRIAL ABLATION    . SPINE SURGERY  2013   fusion by Dr Danielle Dess, s Rod in childhood at [facility]    There were no vitals filed for this visit.   Subjective Assessment - 12/07/18 1425    Subjective  Pt. is a 38 y/o female referred to PT with c/o right lateral hip and thigh pain. See PMH: pt. has history scoliosis with extensive spinal surgical history with chronic back pain in pain management. She reports insidious onset of right hip symptoms last month. Mild relief with injection about 2 weeks ago but pain has persisted. Pt. diagnosed by MD with trochanteric bursitis and IT band syndrome. Previous history of similar symptoms on left side about 10 months ago but current symptoms on right    Pertinent History  scoliosis with extensive fusion procedure from upper spine into pelvis and sacrum, chronic back pain in pain management    Limitations  Standing;Walking;House hold  activities;Lifting    Diagnostic tests  X-rays    Patient Stated Goals  Get rid of hip pain    Currently in Pain?  Yes    Pain Score  9     Pain Location  Hip    Pain Orientation  Right;Lateral    Pain Descriptors / Indicators  Stabbing;Sharp    Pain Type  Acute pain    Pain Radiating Towards  righ lateral thigh to knee    Pain Onset  More than a month ago    Pain Frequency  Constant    Aggravating Factors   standing and walking    Pain Relieving Factors  medication    Effect of Pain on Daily Activities  limits standing and walking tolerance, also can limit sitting tolerance         [facility] PT Assessment - 12/07/18 0001      Assessment   Medical Diagnosis  Right hip trochanteric bursitis, IT band syndrome    Referring Provider (PT)  Doneen Poisson, MD    Onset Date/Surgical Date  10/27/18    Hand Dominance  Right    Prior Therapy  --   past PT after back surgery     Precautions   Precaution Comments  history extensive spinal fusion procedure due to scoliosis  Restrictions   Weight Bearing Restrictions  No      Balance Screen   Has the patient fallen in the past 6 months  No      Home Environment   Living Environment  Private residence    Home Access  Stairs to enter    Entrance Stairs-Number of Steps  3      Prior Function   Level of Independence  Independent with community mobility without device      Cognition   Overall Cognitive Status  Within Functional Limits for tasks assessed      Observation/Other Assessments   Focus on Therapeutic Outcomes (FOTO)   74% Limited      Sensation   Light Touch  Appears Intact      Posture/Postural Control   Posture Comments  Left lateral trunk lean      ROM / Strength   AROM / PROM / Strength  AROM;PROM;Strength      AROM   AROM Assessment Site  Hip;Lumbar    Right/Left Hip  Right;Left    Right Hip Flexion  95    Right Hip External Rotation   60    Right Hip Internal Rotation   10    Right Hip ABduction   40    Right Hip ADduction  --   [facility]   Left Hip Flexion  100    Left Hip External Rotation   60    Left Hip Internal Rotation   15    Left Hip ABduction  40    Left Hip ADduction  --   [facility]   Lumbar Flexion  60    Lumbar Extension  10    Lumbar - Right Side Bend  0    Lumbar - Left Side Bend  20    Lumbar - Right Rotation  30%    Lumbar - Left Rotation  50%      Strength   Overall Strength Comments  Bilat. LE MMTs grossly 4/5 excepting right hip abduction and extension 4-/5      Flexibility   Soft Tissue Assessment /Muscle Length  --   tight right hip rotators and IT band, tight hamstrings bilat     Special Tests   Other special tests  Ober's (+) on right, SLR (-)                Objective measurements completed on examination: See above findings.      [facility] Adult PT Treatment/Exercise - 12/07/18 0001      Exercises   Exercises  Knee/Hip      Knee/Hip Exercises: Stretches   Other Knee/Hip Stretches  HEP instruction supine vs. standing IT band stretches, supine lateral hip, piriformis stretch, clamshelll                  PT Long Term Goals - 12/07/18 2056      PT LONG TERM GOAL #1   Title  Independent with HEP    Baseline  needs HEP    Time  6    Period  Weeks    Status  New    Target Date  01/18/19      PT LONG TERM GOAL #2   Title  Increase right hip abduction strength 1/2 MMT grade or greater to improve pelvic stability with gait to decrease hip pain and improve walking tolerance    Baseline  4-/5    Time  6    Period  Weeks  Status  New    Target Date  01/18/19      PT LONG TERM GOAL #3   Title  Improve FOTO outcome measure score to 44% or less impairment    Baseline  74% limited    Time  6    Period  Weeks    Status  New    Target Date  01/18/19      PT LONG TERM GOAL #4   Title  (-) Ober's test for decreased IT band tightness to decrease lateral hip and thigh pain with walking    Baseline  (+) Ober's test    Time  6     Period  Weeks    Status  New    Target Date  01/18/19      PT LONG TERM GOAL #5   Title  Tolerate standing and ambulation periods at least 30 min for chores at home and activities such as grocery shopping with right hip and thigh pain 5/10 or less    Baseline  9/10    Time  6    Period  Weeks    Status  New    Target Date  01/18/19             Plan - 12/07/18 2047    Clinical Impression Statement  Pt. presents with right lateral hip and thigh pain with associated muscle tightness and weakness consistent with referring dx. trochanteric bursitis. Suspect contributing factors with posture and gait mechanics associated with back issues and LE weakness from scoliosis and substantial back surgeries as well. Pt. would benefit from PT to help relieve pain and address current associated functional limitations for mobility.    Personal Factors and Comorbidities  Comorbidity 2    Comorbidities  depression, chronic back with history scoliosis, history of back surgery    Examination-Activity Limitations  Stairs;Squat;Locomotion Level;Sleep    Examination-Participation Restrictions  Shop    Stability/Clinical Decision Making  Evolving/Moderate complexity    Clinical Decision Making  Low    Rehab Potential  Good    PT Frequency  2x / week    PT Duration  6 weeks    PT Treatment/Interventions  ADLs/Self Care Home Management;Electrical Stimulation;Iontophoresis 4mg /ml Dexamethasone;Moist Heat;Ultrasound;Cryotherapy;Therapeutic exercise;Therapeutic activities;Functional mobility training;Neuromuscular re-education;Patient/family education;Manual techniques;Dry needling;Taping    PT Next Visit Plan  Review HEP as needed, if certification signed trial ionto, US trochanteric region, possible dry needling right gluteus medius/minimus, IT band and lateral hip stretches, add/progress gentle strengthening as tolerated    PT Home Exercise Plan  supine vs. standing IT band/TFL stretch, supine lateral  hip/piriformis stretch, clamshell    Consulted and Agree with Plan of Care  Patient       Patient will benefit from skilled therapeutic intervention in order to improve the following deficits and impairments:  Pain, Impaired flexibility, Decreased strength, Decreased activity tolerance, Decreased range of motion, Difficulty walking, Increased muscle spasms  Visit Diagnosis: Pain in right hip  Muscle weakness (generalized)  Difficulty in walking, not elsewhere classified     Problem List Patient Active Problem List   Diagnosis Date Noted  . Headache 06/30/2015  . Idiopathic scoliosis 02/08/2015  . Obesity 02/08/2015  . Lumbar pain 02/08/2015  . Depression 02/08/2015    Lazarus Gowdahristopher , PT, DPT 12/07/18 9:01 PM  [facility] [address] [place], [place], [address] Phone: [phone]   Fax:  [phone]  Name: Katie Ramirez MRN: [id number] Date of Birth: 04/03/1980

## 2018-12-18 ENCOUNTER — Encounter

## 2018-12-24 ENCOUNTER — Ambulatory Visit: Payer: Medicare Other | Admitting: Physical Therapy

## 2018-12-28 ENCOUNTER — Encounter: Payer: Self-pay | Admitting: Physical Therapy

## 2018-12-28 ENCOUNTER — Other Ambulatory Visit: Payer: Self-pay

## 2018-12-28 ENCOUNTER — Ambulatory Visit: Payer: Medicare Other | Attending: Orthopaedic Surgery | Admitting: Physical Therapy

## 2018-12-28 DIAGNOSIS — M25551 Pain in right hip: Secondary | ICD-10-CM | POA: Insufficient documentation

## 2018-12-28 DIAGNOSIS — R262 Difficulty in walking, not elsewhere classified: Secondary | ICD-10-CM

## 2018-12-28 DIAGNOSIS — M6281 Muscle weakness (generalized): Secondary | ICD-10-CM | POA: Insufficient documentation

## 2018-12-28 NOTE — Patient Instructions (Signed)
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
## 2018-12-30 ENCOUNTER — Ambulatory Visit: Payer: Medicare Other | Admitting: Physical Therapy

## 2018-12-30 ENCOUNTER — Encounter: Payer: Self-pay | Admitting: Physical Therapy

## 2018-12-30 ENCOUNTER — Other Ambulatory Visit: Payer: Self-pay

## 2018-12-30 DIAGNOSIS — R262 Difficulty in walking, not elsewhere classified: Secondary | ICD-10-CM

## 2018-12-30 DIAGNOSIS — M25551 Pain in right hip: Secondary | ICD-10-CM

## 2018-12-30 DIAGNOSIS — M6281 Muscle weakness (generalized): Secondary | ICD-10-CM

## 2018-12-30 NOTE — Therapy (Signed)
[place], [place], [address] Phone: [phone]   Fax:  [phone]  Physical Therapy Treatment  Patient Details  Name: Katie Ramirez MRN: [id number] Date of Birth: [birth year] Referring Provider (PT): Jean Rosenthal, MD   Encounter Date: 12/30/2018  PT End of Session - 12/30/18 1626    Visit Number  3    Number of Visits  12    Date for PT Re-Evaluation  01/18/19    Authorization Type  Medicare, progress note at visit 10 and KX by visit 15    PT Start Time  1548    PT Stop Time  1627    PT Time Calculation (min)  39 min    Activity Tolerance  Patient tolerated treatment well    Behavior During Therapy  [facility] for tasks assessed/performed       Past Medical History:  Diagnosis Date  . Chronic back pain   . Depression   . Scoliosis     Past Surgical History:  Procedure Laterality Date  . ANTERIOR CERVICAL DECOMP/DISCECTOMY FUSION  12/02/2017  . BACK SURGERY     Harrington rods for scoliosis  . BTL    . CESAREAN SECTION    . ENDOMETRIAL ABLATION    . [place]  2013   fusion by Dr Ellene Route, s Rod in childhood at [facility]    There were no vitals filed for this visit.  Subjective Assessment - 12/30/18 1547    Subjective  Pt. reports soreness after last visit about 1-2 hours then had mild improvement of hip pain.                       [facility] Adult PT Treatment/Exercise - 12/30/18 0001      Knee/Hip Exercises: Stretches   ITB Stretch  Right;3 reps;30 seconds    ITB Stretch Limitations  supine manual stretch    Piriformis Stretch  Right;3 reps;30 seconds    Piriformis Stretch Limitations  supine manual stretch      Knee/Hip Exercises: Seated   Clamshell with TheraBand  Green   x 20 reps     Knee/Hip Exercises: Supine   Bridges  AROM;Strengthening;Both;15 reps    Bridges Limitations  partial bridge lift off with legs on reversed incline wedge    Other Supine Knee/Hip  Exercises  clamshell green band x 15 reps   verbal + tactile cues for form   Other Supine Knee/Hip Exercises  hip adduction isometric with ball squeeze x 15 reps      Ultrasound   Ultrasound Location  Right greater trochanteric region    Ultrasound Parameters  1 MHZ 100% 1.1 W/cm2    Ultrasound Goals  Pain      Manual Therapy   Soft tissue mobilization  STM right lateral hip, IASTM/roller use right IT band             PT Education - 12/30/18 1619    Education Details  exercises, POC    Person(s) Educated  Patient    Methods  Explanation;Demonstration;Verbal cues    Comprehension  Returned demonstration;Verbalized understanding          PT Long Term Goals - 12/07/18 2056      PT LONG TERM GOAL #1   Title  Independent with HEP    Baseline  needs HEP    Time  6    Period  Weeks    Status  New    Target Date  01/18/19      PT LONG TERM GOAL #2   Title  Increase right hip abduction strength 1/2 MMT grade or greater to improve pelvic stability with gait to decrease hip pain and improve walking tolerance    Baseline  4-/5    Time  6    Period  Weeks    Status  New    Target Date  01/18/19      PT LONG TERM GOAL #3   Title  Improve FOTO outcome measure score to 44% or less impairment    Baseline  74% limited    Time  6    Period  Weeks    Status  New    Target Date  01/18/19      PT LONG TERM GOAL #4   Title  (-) Ober's test for decreased IT band tightness to decrease lateral hip and thigh pain with walking    Baseline  (+) Ober's test    Time  6    Period  Weeks    Status  New    Target Date  01/18/19      PT LONG TERM GOAL #5   Title  Tolerate standing and ambulation periods at least 30 min for chores at home and activities such as grocery shopping with right hip and thigh pain 5/10 or less    Baseline  9/10    Time  6    Period  Weeks    Status  New    Target Date  01/18/19            Plan - 12/30/18 1627    Clinical Impression Statement   Mild improvement after last treatment with decreased hip pain and functional gains for walking tolerance. Added more hip strengthening exercises today with good tolerance. Pt. would benefit from continued PT per POC from eval for further progress to improve abilities for standing tolerance and ambulation.    Personal Factors and Comorbidities  Comorbidity 2    Comorbidities  depression, chronic back with history scoliosis, history of back surgery    Examination-Activity Limitations  Stairs;Squat;Locomotion Level;Sleep    Examination-Participation Restrictions  Shop    Stability/Clinical Decision Making  Evolving/Moderate complexity    Clinical Decision Making  Low    Rehab Potential  Good    PT Frequency  2x / week    PT Duration  6 weeks    PT Treatment/Interventions  ADLs/Self Care Home Management;Electrical Stimulation;Iontophoresis 4mg /ml Dexamethasone;Moist Heat;Ultrasound;Cryotherapy;Therapeutic exercise;Therapeutic activities;Functional mobility training;Neuromuscular re-education;Patient/family education;Manual techniques;Dry needling;Taping    PT Next Visit Plan  if certification signed trial ionto, US trochanteric region, dry needling to right lateral hip, progress gentle strengthening as tolerated, manual to right hip and IT band    PT Home Exercise Plan  supine vs. standing IT band/TFL stretch, supine lateral hip/piriformis stretch, clamshell    Consulted and Agree with Plan of Care  Patient       Patient will benefit from skilled therapeutic intervention in order to improve the following deficits and impairments:  Pain, Impaired flexibility, Decreased strength, Decreased activity tolerance, Decreased range of motion, Difficulty walking, Increased muscle spasms  Visit Diagnosis: Pain in right hip  Muscle weakness (generalized)  Difficulty in walking, not elsewhere classified     Problem List Patient Active Problem List   Diagnosis Date Noted  . Headache 06/30/2015  .  Idiopathic scoliosis 02/08/2015  . Obesity 02/08/2015  . Lumbar pain 02/08/2015  . Depression 02/08/2015    Lazarus Gowdahristopher ,  PT, DPT 12/30/18 4:34 PM  [facility] [address] [place], [place], [address] Phone: [phone]   Fax:  [phone]  Name: Katie Ramirez MRN: [id number] Date of Birth: Feb 10, 1981

## 2019-01-04 ENCOUNTER — Ambulatory Visit: Payer: Medicare Other | Admitting: Physical Therapy

## 2019-01-04 ENCOUNTER — Other Ambulatory Visit: Payer: Self-pay

## 2019-01-04 ENCOUNTER — Encounter: Payer: Self-pay | Admitting: Physical Therapy

## 2019-01-04 DIAGNOSIS — M25551 Pain in right hip: Secondary | ICD-10-CM | POA: Diagnosis not present

## 2019-01-04 DIAGNOSIS — R262 Difficulty in walking, not elsewhere classified: Secondary | ICD-10-CM

## 2019-01-04 DIAGNOSIS — M6281 Muscle weakness (generalized): Secondary | ICD-10-CM

## 2019-01-04 NOTE — Therapy (Signed)
[place], [place], [address] Phone: [phone]   Fax:  [phone]  Physical Therapy Treatment  Patient Details  Name: Katie Ramirez MRN: [id number] Date of Birth: 10-06-1980 Referring Provider (PT): Jean Rosenthal, MD   Encounter Date: 01/04/2019  PT End of Session - 01/04/19 1610    Visit Number  4    Number of Visits  12    Date for PT Re-Evaluation  01/18/19    Authorization Type  Medicare, progress note at visit 10 and KX by visit 15    PT Start Time  1546    PT Stop Time  1630    PT Time Calculation (min)  44 min    Activity Tolerance  Patient tolerated treatment well    Behavior During Therapy  [facility] for tasks assessed/performed       Past Medical History:  Diagnosis Date  . Chronic back pain   . Depression   . Scoliosis     Past Surgical History:  Procedure Laterality Date  . ANTERIOR CERVICAL DECOMP/DISCECTOMY FUSION  12/02/2017  . BACK SURGERY     Harrington rods for scoliosis  . BTL    . CESAREAN SECTION    . ENDOMETRIAL ABLATION    . [place]  2013   fusion by Dr Ellene Route, s Rod in childhood at [facility]    There were no vitals filed for this visit.  Subjective Assessment - 01/04/19 1548    Subjective  Pt. reports that for the past month she has been having some intermittent pain on the dorsal aspect of her left foot and great toe. She wonders if this is coming from her back. She has chronic numbness in her left leg more proximally so reports it is difficult to tell if these symptoms are radiating from her back/down her leg or is local to her foot. For her right hip mild improvement with pain about a 4/10 this PM but notes still difficulty tolerating lying down on her right side.    Pertinent History  scoliosis with extensive fusion procedure from upper spine into pelvis and sacrum, chronic back pain in pain management    Limitations  Standing;Walking;House hold  activities;Lifting    Diagnostic tests  X-rays    Patient Stated Goals  Get rid of hip pain    Currently in Pain?  Yes    Pain Score  4     Pain Location  Hip    Pain Orientation  Right;Lateral    Pain Descriptors / Indicators  Sharp    Pain Type  Chronic pain    Pain Onset  More than a month ago    Pain Frequency  Constant    Aggravating Factors   standing and walking, right sidelying position    Pain Relieving Factors  medication and rest    Effect of Pain on Daily Activities  limits standing and walking tolerance as well as positional tolerance for right sidelying         [facility] PT Assessment - 01/04/19 0001      Strength   Overall Strength Comments  R hip abduction 4/5                   [facility] Adult PT Treatment/Exercise - 01/04/19 0001      Knee/Hip Exercises: Stretches   ITB Stretch  Right;3 reps;30 seconds    ITB Stretch Limitations  supine manual stretch    Piriformis Stretch  Right;3 reps;30  seconds    Piriformis Stretch Limitations  supine manual stretch      Ultrasound   Ultrasound Location  Right greater trochanteric region    Ultrasound Parameters  1 MHZ 100% 1.0 W/cm2    Ultrasound Goals  Pain      Manual Therapy   Soft tissue mobilization  STM right lateral hip, IASTM/roller use right IT band       Trigger Point Dry Needling - 01/04/19 0001    Consent Given?  Yes    Muscles Treated Back/Hip  Gluteus minimus;Gluteus medius    Dry Needling Comments  needled in left sidelying with 30 gauge 75 mm needles    Electrical Stimulation Performed with Dry Needling  Yes    E-stim with Dry Needling Details  TENS 2 pps x 10 minutes                PT Long Term Goals - 01/04/19 1635      PT LONG TERM GOAL #1   Title  Independent with HEP    Baseline  met with initial HEP, will update    Time  6    Period  Weeks    Status  Achieved      PT LONG TERM GOAL #2   Title  Increase right hip abduction strength 1/2 MMT grade or greater to improve  pelvic stability with gait to decrease hip pain and improve walking tolerance    Baseline  4/5    Time  6    Period  Weeks    Status  Achieved      PT LONG TERM GOAL #3   Title  Improve FOTO outcome measure score to 44% or less impairment    Baseline  74% limited    Time  6    Period  Weeks    Status  On-going      PT LONG TERM GOAL #4   Title  (-) Ober's test for decreased IT band tightness to decrease lateral hip and thigh pain with walking    Baseline  (+) Ober's test    Time  6    Period  Weeks    Status  On-going      PT LONG TERM GOAL #5   Title  Tolerate standing and ambulation periods at least 30 min for chores at home and activities such as grocery shopping with right hip and thigh pain 5/10 or less    Baseline  4/10 today but still intermittently higher with standing, walking and right sidelying    Time  6    Period  Weeks    Status  On-going            Plan - 01/04/19 1610    Clinical Impression Statement  As previously mild improvement with hip pain but still with functional limitations for sidelying positioning and standing/walking tolerance. For left foot symptoms as noted by pt. unclear etiology but differential diagnosis could include lumbar etiology particularly given history chronic back issues and extensive surgical history so recommend follow up with MD for further assessment of this as needed.    Personal Factors and Comorbidities  Comorbidity 2    Comorbidities  depression, chronic back with history scoliosis, history of back surgery    Examination-Activity Limitations  Stairs;Squat;Locomotion Level;Sleep    Examination-Participation Restrictions  Shop    Stability/Clinical Decision Making  Evolving/Moderate complexity    Clinical Decision Making  Low    Rehab Potential  Good  PT Frequency  2x / week    PT Duration  6 weeks    PT Treatment/Interventions  ADLs/Self Care Home Management;Electrical Stimulation;Iontophoresis 62m/ml Dexamethasone;Moist  Heat;Ultrasound;Cryotherapy;Therapeutic exercise;Therapeutic activities;Functional mobility training;Neuromuscular re-education;Patient/family education;Manual techniques;Dry needling;Taping    PT Next Visit Plan  if certification signed trial ionto, UKoreatrochanteric region, progress gentle strengthening as tolerated, manual to right hip and IT band    PT Home Exercise Plan  supine vs. standing IT band/TFL stretch, supine lateral hip/piriformis stretch, clamshell    Consulted and Agree with Plan of Care  Patient       Patient will benefit from skilled therapeutic intervention in order to improve the following deficits and impairments:  Pain, Impaired flexibility, Decreased strength, Decreased activity tolerance, Decreased range of motion, Difficulty walking, Increased muscle spasms  Visit Diagnosis: Pain in right hip  Muscle weakness (generalized)  Difficulty in walking, not elsewhere classified     Problem List Patient Active Problem List   Diagnosis Date Noted  . Headache 06/30/2015  . Idiopathic scoliosis 02/08/2015  . Obesity 02/08/2015  . Lumbar pain 02/08/2015  . Depression 02/08/2015    Katie Ramirez PT, DPT 01/04/19 4:38 PM  [facility] [place] [address]Phone: [phone]  Fax:  [phone] Name: Katie BuffaloMRN: [id number] of Birth: 502-Apr-1982

## 2019-01-06 ENCOUNTER — Ambulatory Visit: Payer: Medicare Other | Admitting: Physical Therapy

## 2019-01-11 ENCOUNTER — Telehealth: Payer: Self-pay | Admitting: Physical Therapy

## 2019-01-11 ENCOUNTER — Ambulatory Visit: Payer: Medicare Other | Admitting: Physical Therapy

## 2019-01-11 NOTE — Telephone Encounter (Signed)
Attempted to call patient regarding no show for therapy appointment this afternoon. Left voicemail to contact clinic if wishing to reschedule.

## 2019-01-13 ENCOUNTER — Other Ambulatory Visit: Payer: Self-pay

## 2019-01-13 ENCOUNTER — Ambulatory Visit (INDEPENDENT_AMBULATORY_CARE_PROVIDER_SITE_OTHER): Payer: Medicare Other | Admitting: Family Medicine

## 2019-01-13 ENCOUNTER — Ambulatory Visit: Payer: Self-pay

## 2019-01-13 ENCOUNTER — Encounter: Payer: Self-pay | Admitting: Family Medicine

## 2019-01-13 DIAGNOSIS — M5416 Radiculopathy, lumbar region: Secondary | ICD-10-CM

## 2019-01-13 DIAGNOSIS — M79672 Pain in left foot: Secondary | ICD-10-CM

## 2019-01-13 MED ORDER — DICLOFENAC SODIUM 1 % EX GEL: 4.0000 g | Freq: Four times a day (QID) | CUTANEOUS | 6 refills | Status: DC | PRN

## 2019-01-13 NOTE — Patient Instructions (Signed)
   Vitamin D3:  5,000 IU daily  Vitamin K2:  100 mcg daily  Magnesium:  400 mg daily   

## 2019-01-13 NOTE — Progress Notes (Signed)
Office Visit Note   Patient: Katie Ramirez           Date of Birth: 08/04/1980           MRN: [id number] Visit Date: 01/13/2019 Requested by: Sherene Sires, DO [address],  [place] [address] PCP: Sherene Sires, DO  Subjective: Chief Complaint  Patient presents with  . Left Foot - Pain    Pain top of foot x 1 month. NKI. Swells. Hurts with driving. S/p back fusions [facility]    HPI: She is here with left foot pain.  Symptoms started about a month ago, no injury.  Pain on the dorsum of the foot with some swelling.  It hurts when she sits in a car for a while, hurts when she walks, and it hurts at night when trying to sleep.  She has had multiple lumbar and cervical fusions.  After one of her fusions she developed numbness in her left knee which has persisted.  At one point prior to back surgery she was having pain in her left foot but it was a little different than it is now.  She has never had a stress fracture.               ROS: No fevers or chills.  All other systems were reviewed and are negative.  Objective: Vital Signs: There were no vitals taken for this visit.  Physical Exam:  General:  Alert and oriented, in no acute distress. Pulm:  Breathing unlabored. Psy:  Normal mood, congruent affect. Skin: No bruising or erythema. Left foot: Slight soft tissue swelling over the second and third metatarsals.  She is exquisitely tender to palpation of the midshaft second metatarsal and has a positive fulcrum test.  Good strength with ankle dorsiflexion, eversion, plantarflexion and EHL testing.  Imaging: X-rays left foot: She has a bipartite medial sesamoid with mild first MTP DJD.  No metatarsal stress fracture seen.    Assessment & Plan: 1.  Left foot pain suspicious for second metatarsal stress fracture.  Cannot rule out radicular pain. -We will treat with vitamin D3, vitamin K2 and magnesium. -Postop shoe or fracture boot if pain worsens. -Return in about 3  weeks if not getting better, we will repeat x-rays at that point.  If x-rays are equivocal, then MRI scan of the foot.     Procedures: No procedures performed  No notes on file     PMFS History: Patient Active Problem List   Diagnosis Date Noted  . Headache 06/30/2015  . Idiopathic scoliosis 02/08/2015  . Obesity 02/08/2015  . Lumbar pain 02/08/2015  . Depression 02/08/2015   Past Medical History:  Diagnosis Date  . Chronic back pain   . Depression   . Scoliosis     Family History  Problem Relation Age of Onset  . Hypertension Father   . Diabetes Father   . Atrial fibrillation Father   . Down syndrome Sister   . ADD / ADHD Daughter   . Asthma Son   . ADD / ADHD Son     Past Surgical History:  Procedure Laterality Date  . ANTERIOR CERVICAL DECOMP/DISCECTOMY FUSION  12/02/2017  . BACK SURGERY     Harrington rods for scoliosis  . BTL    . CESAREAN SECTION    . ENDOMETRIAL ABLATION    . [place] [facility]  2013   fusion by Dr Ellene Route, s Rod in childhood at [place]  History  . Not on file  Tobacco Use  . Smoking status: Never Smoker  . Smokeless tobacco: Never Used  Substance and Sexual Activity  . Alcohol use: No  . Drug use: No  . Sexual activity: Never

## 2019-01-18 ENCOUNTER — Other Ambulatory Visit: Payer: Self-pay

## 2019-01-18 ENCOUNTER — Ambulatory Visit (INDEPENDENT_AMBULATORY_CARE_PROVIDER_SITE_OTHER): Payer: Medicare Other | Admitting: Orthopaedic Surgery

## 2019-01-18 ENCOUNTER — Encounter: Payer: Self-pay | Admitting: Orthopaedic Surgery

## 2019-01-18 DIAGNOSIS — M79672 Pain in left foot: Secondary | ICD-10-CM

## 2019-01-18 NOTE — Progress Notes (Signed)
Patient is someone else about 8 weeks ago with right hip trochanteric bursitis.  Between a steroid injection and therapy the symptoms of come down significantly.  She did see Dr. Junius Roads last week with pain in her left midfoot.  He felt that there was a chance of this being a stress fracture.  X-rays were negative.  He recommended some modalities and medications for her.  She says her hip is doing much better overall but her left foot still bothers her.  On exam she does have midfoot tenderness to palpation but no gross deformities on exam.  Her foot is well-perfused with normal sensation.  We will have her try a short walking boot only left foot to see if this will offload the foot and hopefully decrease her pain.  She will keep her follow-up with Dr. Junius Roads in 2 to 3 weeks to see how she is doing overall.  If she still having pain I agree with his plan of likely obtaining an MRI of the left foot.

## 2019-02-03 ENCOUNTER — Ambulatory Visit: Payer: Medicare Other | Admitting: Family Medicine

## 2019-02-03 NOTE — Therapy (Signed)
[place], [place], [address] Phone: [phone]   Fax:  [phone]  Physical Therapy Treatment/Discharge  Patient Details  Name: Katie Ramirez MRN: [id number] Date of Birth: February 22, 1981 Referring Provider (PT): Jean Rosenthal, MD   Encounter Date: 01/04/2019    Past Medical History:  Diagnosis Date  . Chronic back pain   . Depression   . Scoliosis     Past Surgical History:  Procedure Laterality Date  . ANTERIOR CERVICAL DECOMP/DISCECTOMY FUSION  12/02/2017  . BACK SURGERY     Harrington rods for scoliosis  . BTL    . CESAREAN SECTION    . ENDOMETRIAL ABLATION    . [place]  2013   fusion by Dr Ellene Route, s Rod in childhood at [facility]    There were no vitals filed for this visit.                                 PT Long Term Goals - 01/04/19 1635      PT LONG TERM GOAL #1   Title  Independent with HEP    Baseline  met with initial HEP, will update    Time  6    Period  Weeks    Status  Achieved      PT LONG TERM GOAL #2   Title  Increase right hip abduction strength 1/2 MMT grade or greater to improve pelvic stability with gait to decrease hip pain and improve walking tolerance    Baseline  4/5    Time  6    Period  Weeks    Status  Achieved      PT LONG TERM GOAL #3   Title  Improve FOTO outcome measure score to 44% or less impairment    Baseline  74% limited    Time  6    Period  Weeks    Status  On-going      PT LONG TERM GOAL #4   Title  (-) Ober's test for decreased IT band tightness to decrease lateral hip and thigh pain with walking    Baseline  (+) Ober's test    Time  6    Period  Weeks    Status  On-going      PT LONG TERM GOAL #5   Title  Tolerate standing and ambulation periods at least 30 min for chores at home and activities such as grocery shopping with right hip and thigh pain 5/10 or less    Baseline  4/10 today but still  intermittently higher with standing, walking and right sidelying    Time  6    Period  Weeks    Status  On-going              Patient will benefit from skilled therapeutic intervention in order to improve the following deficits and impairments:  Pain, Impaired flexibility, Decreased strength, Decreased activity tolerance, Decreased range of motion, Difficulty walking, Increased muscle spasms  Visit Diagnosis: Pain in right hip  Muscle weakness (generalized)  Difficulty in walking, not elsewhere classified     Problem List Patient Active Problem List   Diagnosis Date Noted  . Headache 06/30/2015  . Idiopathic scoliosis 02/08/2015  . Obesity 02/08/2015  . Lumbar pain 02/08/2015  . Depression 02/08/2015        PHYSICAL THERAPY DISCHARGE SUMMARY  Visits from Start of Care: 4  Current  functional level related to goals / functional outcomes: Patient did not return for further therapy after last session 01/04/19 with no show for last scheduled visit. No further visits scheduled at this time.   Remaining deficits: NA   Education / Equipment: NA Plan: Patient agrees to discharge.  Patient goals were not met. Patient is being discharged due to not returning since the last visit.  ?????          Beaulah Dinning, PT, DPT 02/03/19 10:07 AM     [facility] [address] [place], [place], [address] Phone: [phone]   Fax:  [phone]  Name: Cade Olberding MRN: [id number] Date of Birth: 07/07/1980

## 2019-02-25 ENCOUNTER — Other Ambulatory Visit: Payer: Self-pay | Admitting: Nurse Practitioner

## 2019-02-25 DIAGNOSIS — M961 Postlaminectomy syndrome, not elsewhere classified: Secondary | ICD-10-CM

## 2019-03-17 ENCOUNTER — Encounter: Payer: Self-pay | Admitting: Family Medicine

## 2019-03-17 ENCOUNTER — Other Ambulatory Visit: Payer: Self-pay

## 2019-03-17 ENCOUNTER — Ambulatory Visit (INDEPENDENT_AMBULATORY_CARE_PROVIDER_SITE_OTHER): Payer: Medicare Other | Admitting: Family Medicine

## 2019-03-17 DIAGNOSIS — G8929 Other chronic pain: Secondary | ICD-10-CM

## 2019-03-17 DIAGNOSIS — M25511 Pain in right shoulder: Secondary | ICD-10-CM | POA: Diagnosis not present

## 2019-03-17 NOTE — Progress Notes (Signed)
Office Visit Note   Patient: Katie Ramirez           Date of Birth: 1981-02-09           MRN: [id number] Visit Date: 03/17/2019 Requested by: Marthenia Rolling, DO [address] [place],  [place] [address] PCP: Marthenia Rolling, DO  Subjective: Chief Complaint  Patient presents with  . Neck - Pain    S/p fusion at [facility] 2 years ago. Tightness in the area between the shoulder blades. Trigger point injections in the past have helped.  . Left Knee - Pain    Much popping and swelling in the knee. No pain. That leg has been "numb" since her back surgery.    HPI: She is here with right posterior shoulder pain.  She is status post multiple levels of fusion of her cervical and thoracic and lumbar spine.  She has struggled with myofascial trigger points over the years and has done well with injections.  She has not had one in quite a while and feels like she needs one now.              ROS:   All other systems were reviewed and are negative.  Objective: Vital Signs: There were no vitals taken for this visit.  Physical Exam:  General:  Alert and oriented, in no acute distress. Pulm:  Breathing unlabored. Psy:  Normal mood, congruent affect. Skin: No rash on the skin Right shoulder: She has a very tight and tender trigger point medial to the upper portion of the right scapula.  Palpation of this seems to reproduce her pain.  Imaging: None today  Assessment & Plan: 1.  Myofascial right posterior shoulder pain -Elected to inject the trigger point today.  Follow-up as needed.     Procedures: Right shoulder trigger point injection: After sterile prep with Betadine, injected 4 cc 1% lidocaine without epinephrine and 40 mg methylprednisolone into the symptomatic trigger point with good immediate relief.    PMFS History: Patient Active Problem List   Diagnosis Date Noted  . Loosening of hardware in spine (HCC) 04/16/2017  . Pseudarthrosis following spinal fusion 04/16/2017  . Chronic  pain 08/05/2016  . Degenerative spondylolisthesis 06/12/2016  . Headache 06/30/2015  . Idiopathic scoliosis 02/08/2015  . Obesity 02/08/2015  . Lumbar pain 02/08/2015  . Depression 02/08/2015  . S/P lumbar fusion 09/15/2012   Past Medical History:  Diagnosis Date  . Chronic back pain   . Depression   . Scoliosis     Family History  Problem Relation Age of Onset  . Hypertension Father   . Diabetes Father   . Atrial fibrillation Father   . Down syndrome Sister   . ADD / ADHD Daughter   . Asthma Son   . ADD / ADHD Son     Past Surgical History:  Procedure Laterality Date  . ANTERIOR CERVICAL DECOMP/DISCECTOMY FUSION  12/02/2017  . BACK SURGERY     Harrington rods for scoliosis  . BTL    . CESAREAN SECTION    . ENDOMETRIAL ABLATION    . SPINE SURGERY  2013   fusion by Dr Danielle Dess, s Rod in childhood at [facility]   Social History   Occupational History  . Not on file  Tobacco Use  . Smoking status: Never Smoker  . Smokeless tobacco: Never Used  Substance and Sexual Activity  . Alcohol use: No  . Drug use: No  . Sexual activity: Never

## 2019-03-26 ENCOUNTER — Ambulatory Visit
Admission: RE | Admit: 2019-03-26 | Discharge: 2019-03-26 | Disposition: A | Discharge status: Home / Self Care | Payer: Medicare Other | Source: Ambulatory Visit | Attending: Nurse Practitioner | Admitting: Nurse Practitioner

## 2019-03-26 ENCOUNTER — Other Ambulatory Visit: Payer: Self-pay

## 2019-03-26 DIAGNOSIS — M961 Postlaminectomy syndrome, not elsewhere classified: Secondary | ICD-10-CM

## 2019-03-26 IMAGING — MR MR LUMBAR SPINE WO/W CM
4 of 8 series · 24 of 48 positions shown · IV contrast (20ml Multihance)
Comparison: Radiographs of the lumbar spine [DATE], lumbar CT
myelogram [DATE]

CLINICAL DATA: Multilevel failed back syndrome. Left leg weakness
with numbness and tingling worse with sitting.

EXAM:
MRI LUMBAR SPINE WITHOUT AND WITH CONTRAST
TECHNIQUE: Multiplanar and multiecho pulse sequences of the lumbar spine were
obtained without and with intravenous contrast.
CONTRAST:  20mL MULTIHANCE GADOBENATE DIMEGLUMINE 529 MG/ML IV SOLN

[Series 3: T2 · sagittal · 4.0mm · 0.55mm/px · 5 of 17 slices shown (1 of 2)]
[im 1/17]
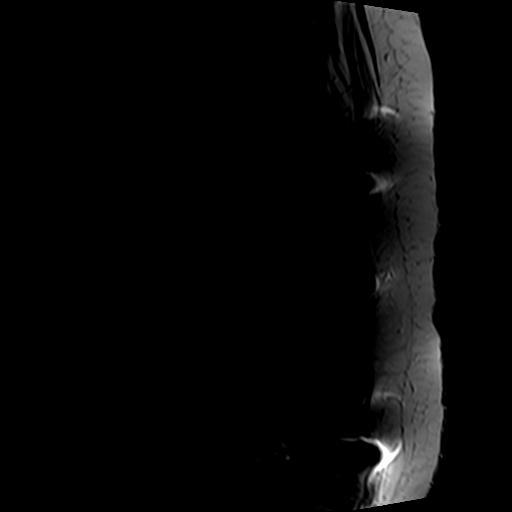
[im 5/17]
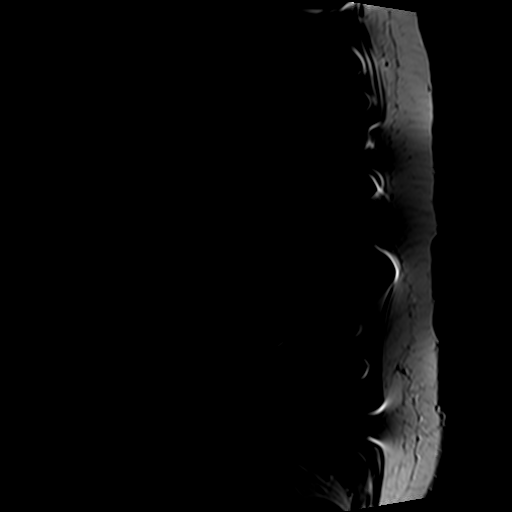
[im 9/17]
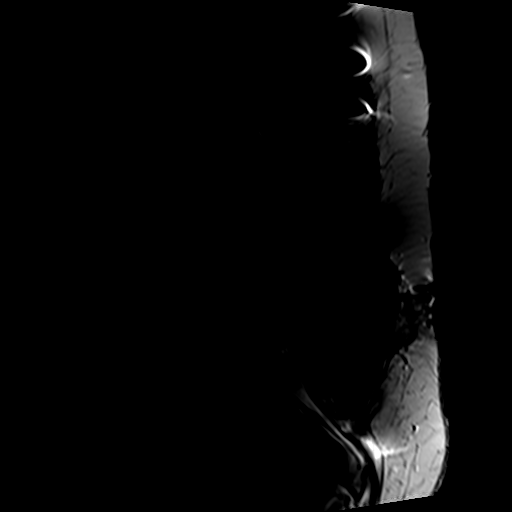
[im 13/17]
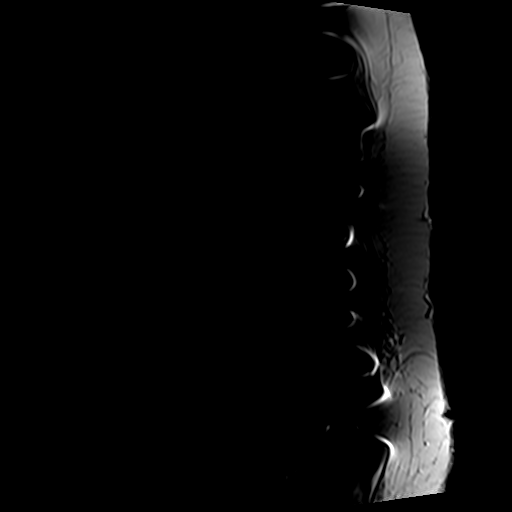
[im 17/17]
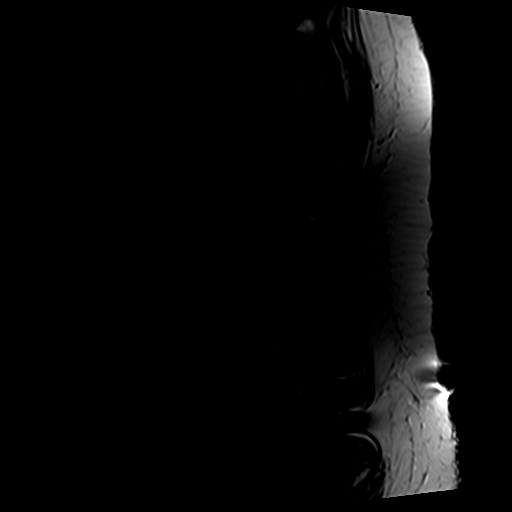

[Series 4: T1 · sagittal · 4.0mm · 0.55mm/px · 4 of 17 slices shown (1 of 2)]
[im 1/17]
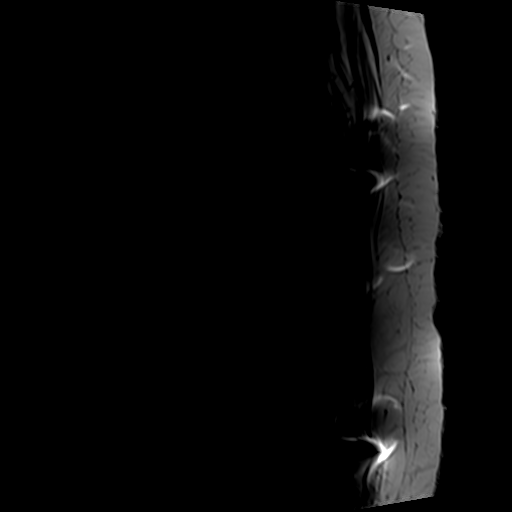
[im 6/17]
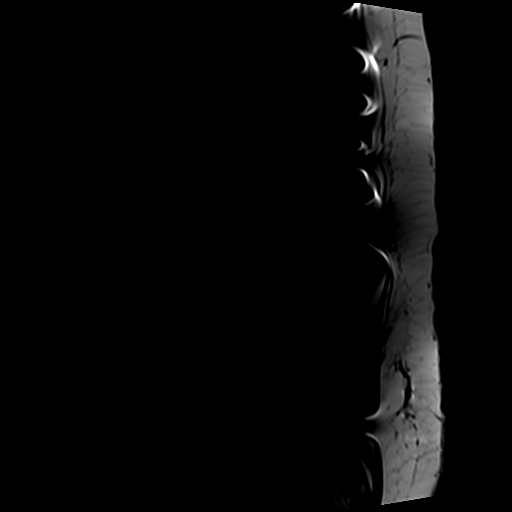
[im 11/17]
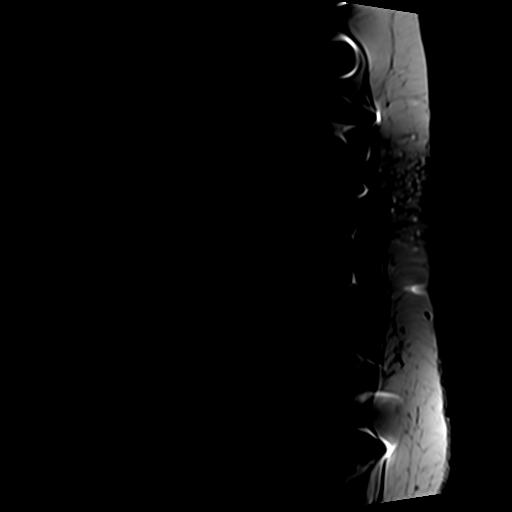
[im 17/17]
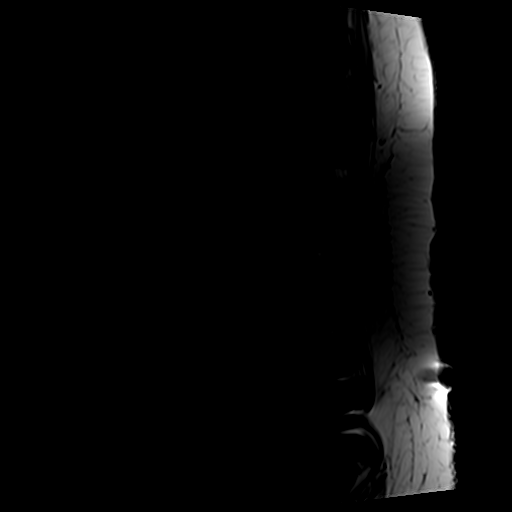

[Series 6: T2 · axial · 4.0mm · 0.70mm/px · z∈[-40,+158]mm · 9 of 36 slices shown (2 of 2)]
[im 1/36]
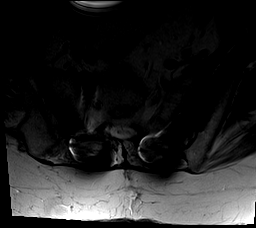
[im 5/36]
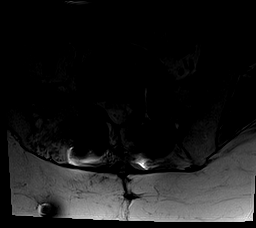
[im 9/36]
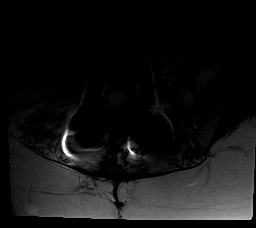
[im 14/36]
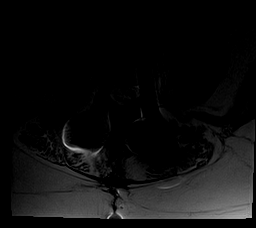
[im 18/36]
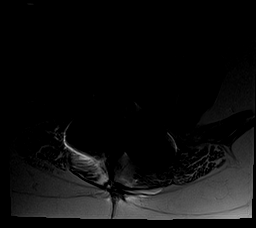
[im 22/36]
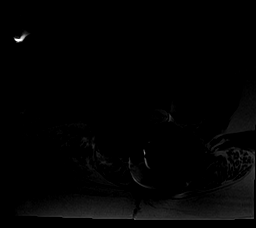
[im 27/36]
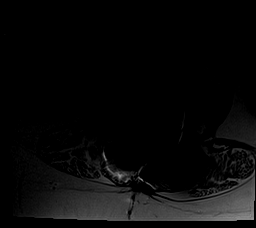
[im 31/36]
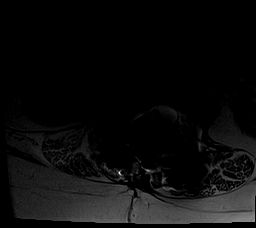
[im 36/36]
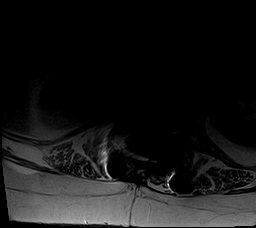

[Series 7: T1 · axial · 4.0mm · 0.35mm/px · z∈[-40,+132]mm · 6 of 36 slices shown (2 of 2)]
[im 1/36]
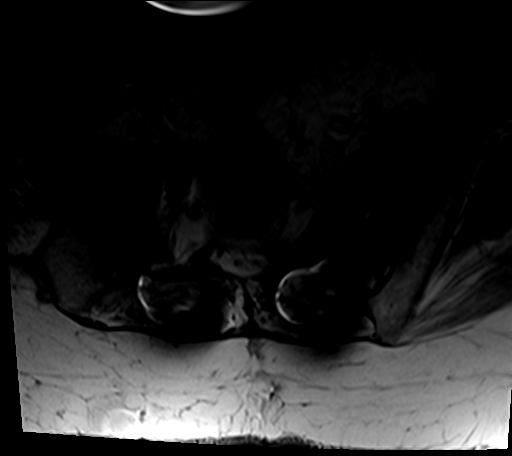
[im 5/36]
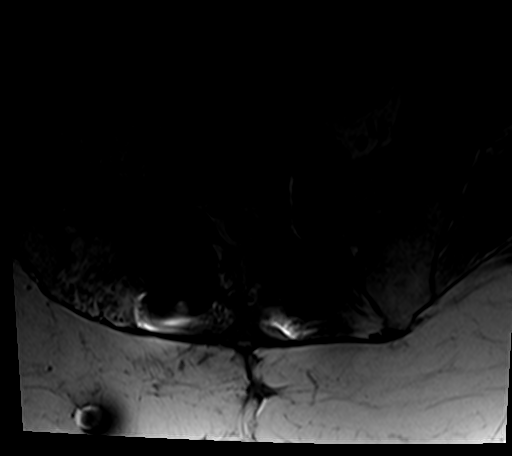
[im 9/36]
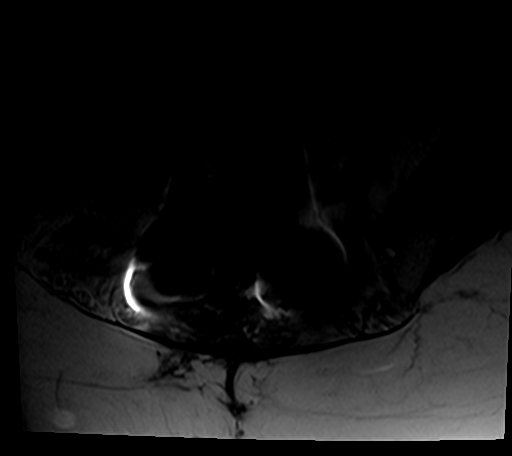
[im 14/36]
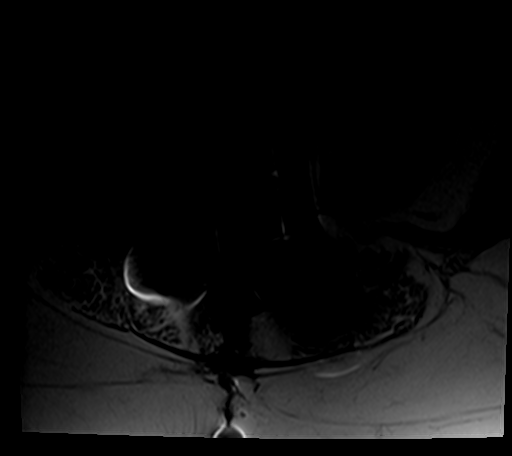
[im 18/36]
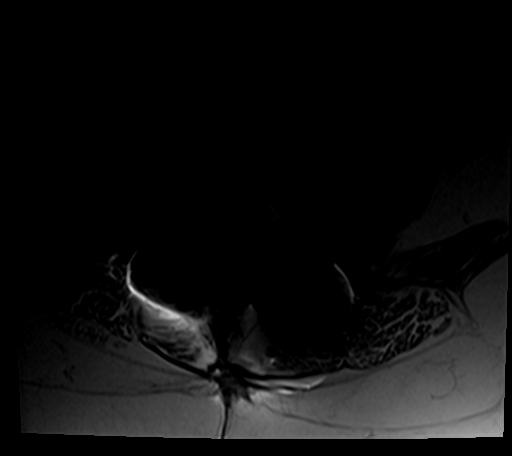
[im 31/36]
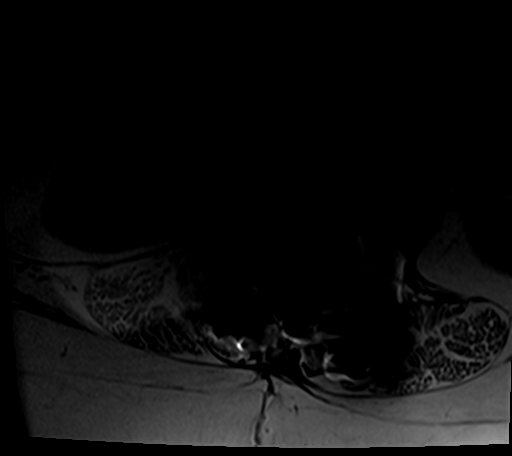

[24 of 48 positions shown; findings below may reference images not displayed]

FINDINGS: Posterior spinal fusion construct at the L1-S1 levels. Posterior
spinal fusion hardware is also present at the visualized lower
thoracic levels. Prominent susceptibility artifact arising from the
spinal hardware significantly limits evaluation of the spinal canal
and neural foramina at multiple levels.

Segmentation:  5 lumbar vertebrae.

Alignment: Lumbar levocurvature. 4 mm grade 1 retrolisthesis at
L3-L4. No other significant spondylolisthesis is identified.

Vertebrae: Posterior spinal fusion construct at the L1-S1 levels.
Posterior spinal fusion hardware is also present at the visualized
lower thoracic levels. Vertebral body height is maintained.
Susceptibility artifact from spinal hardware limits evaluation for
marrow signal abnormality. Within this limitation, no significant
marrow edema or suspicious osseous lesion is identified. No
appreciable abnormal enhancement is appreciated.

Conus medullaris and cauda equina: The level at which the spinal
cord terminates is poorly assessed due to susceptibility artifact.

Paraspinal and other soft tissues: No abnormality identified within
included portions of the abdomen/retroperitoneum. Postsurgical
changes to the visualized dorsal thoracolumbar soft tissues.

Disc levels:

There appears to be solid fusion across the L4-L5 disc space.
Interbody spacer present at L5-S1. Interbody spacers may also be
present at L2-L3 and L3-L4. Moderate disc height loss at the
remaining levels.

T12-L1: This level is poorly assessed due to prominent
susceptibility artifact.

L1-L2: Susceptibility artifact significant limits evaluation. No
appreciable central canal stenosis or significant neural foraminal
narrowing.

L2-L3: Prior posterior decompression. Disc bulge asymmetric to the
right with associated osteophyte ridge. Suspected superimposed
broad-based right foraminal disc protrusion. No significant central
canal stenosis or left neural foraminal narrowing. Susceptibility
artifact precludes adequate evaluation of the right neural foramen.

L3-L4: Suspected prior posterior decompression. Grade 1
retrolisthesis. Disc bulge with endplate spurring. Facet
hypertrophy. Susceptibility artifact limits evaluation for canal
stenosis at this level. No definite spinal canal stenosis. Mild
right with moderate left neural foraminal narrowing.

L4-L5: Prior posterior decompression. Facet hypertrophy. Central
canal patent. No more than mild neural foraminal narrowing.

L5-S1: Prior posterior decompression. Endplate spurring. Facet
hypertrophy. No significant spinal canal stenosis. Mild left neural
foraminal narrowing.
IMPRESSION: Extensive postsurgical changes to the visualized thoracolumbar
spine. Prominent susceptibility artifact from spinal hardware
significant limits evaluation at multiple levels as described.

Within described limitations, no appreciable significant spinal
canal stenosis at any level. Degenerative findings most notably as
follows.

At L2-L3, there is a disc bulge asymmetric to the left with endplate
spurring and suspected broad-based right foraminal disc protrusion.
No appreciable canal stenosis. Susceptibility artifact precludes
evaluation of the right neural foramen.

At L3-L4, there is grade 1 retrolisthesis. Multifactorial bilateral
neural foraminal narrowing (mild right, moderate left). No
appreciable significant central canal stenosis, although
susceptibility artifact limits evaluation.

At L5-S1, endplate spurring and facet hypertrophy result in mild
left neural foraminal narrowing.

## 2019-03-26 MED ORDER — GADOBENATE DIMEGLUMINE 529 MG/ML IV SOLN
20.0000 mL | Freq: Once | INTRAVENOUS | Status: AC | PRN
  Administered 2019-03-26: 20 mL via INTRAVENOUS

## 2019-05-27 ENCOUNTER — Other Ambulatory Visit: Payer: Self-pay | Admitting: Neurological Surgery

## 2019-05-27 DIAGNOSIS — M41126 Adolescent idiopathic scoliosis, lumbar region: Secondary | ICD-10-CM

## 2019-06-09 ENCOUNTER — Ambulatory Visit
Admission: RE | Admit: 2019-06-09 | Discharge: 2019-06-09 | Disposition: A | Discharge status: Home / Self Care | Payer: Medicare Other | Source: Ambulatory Visit | Attending: Neurological Surgery | Admitting: Neurological Surgery

## 2019-06-09 DIAGNOSIS — M41126 Adolescent idiopathic scoliosis, lumbar region: Secondary | ICD-10-CM

## 2019-06-09 IMAGING — CT CT L SPINE W/O CM
3 of 5 series · 12 of 33 positions shown, 14 images · non-contrast
Comparison: Lumbar MRI [DATE].  Abdominal CT [DATE]

CLINICAL DATA: Chronic low back pain.  History of scoliosis surgery

EXAM:
CT LUMBAR SPINE WITHOUT CONTRAST
TECHNIQUE: Multidetector CT imaging of the lumbar spine was performed without
intravenous contrast administration. Multiplanar CT image
reconstructions were also generated.

[Series 5: l-spine 2.00 br60 s3 sag bone · sagittal · 0.36mm/px · 5 of 92 slices shown, 6 images]
[im 31/92  bone]
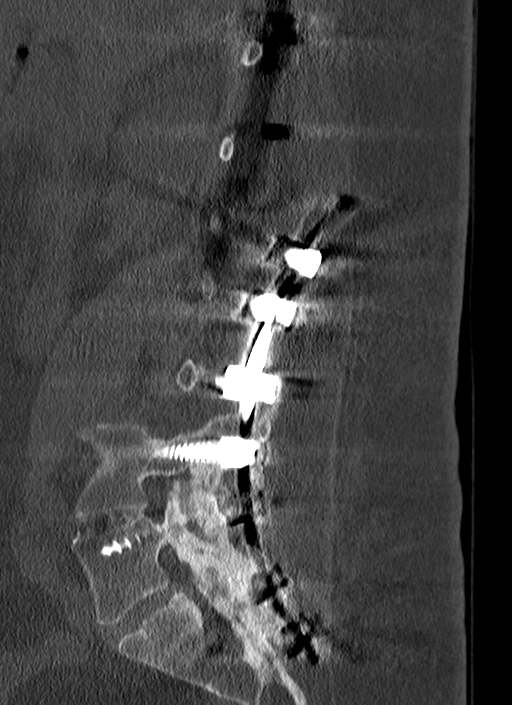
[im 38/92  bone]
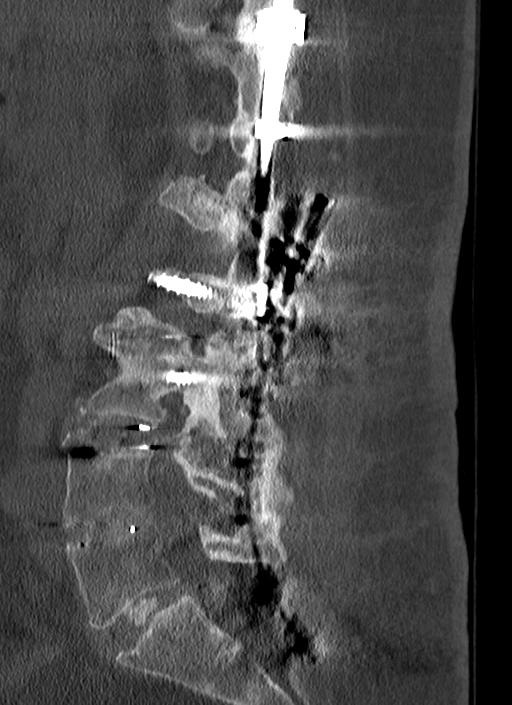
[im 46/92  soft-tissue]
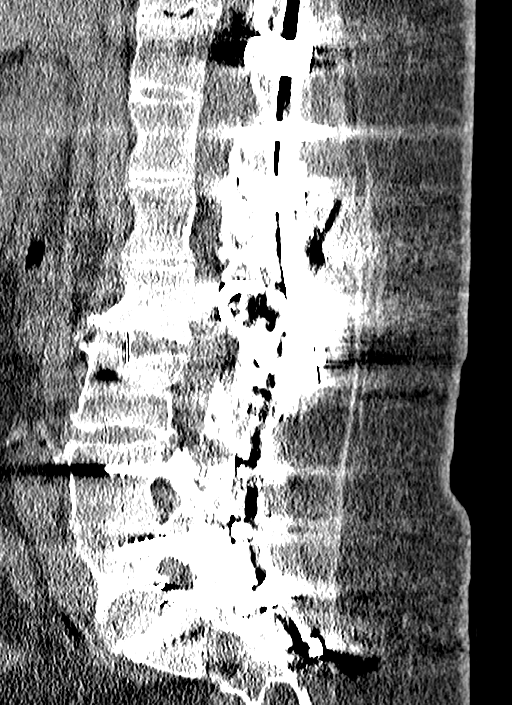
[im 46/92  bone]
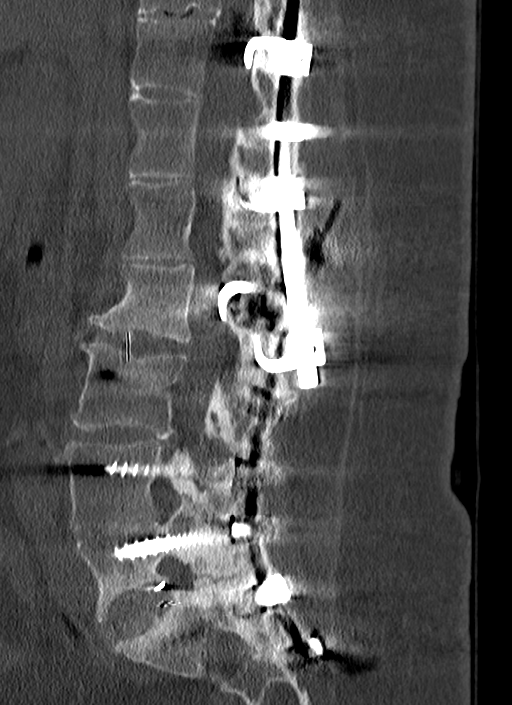
[im 54/92  bone]
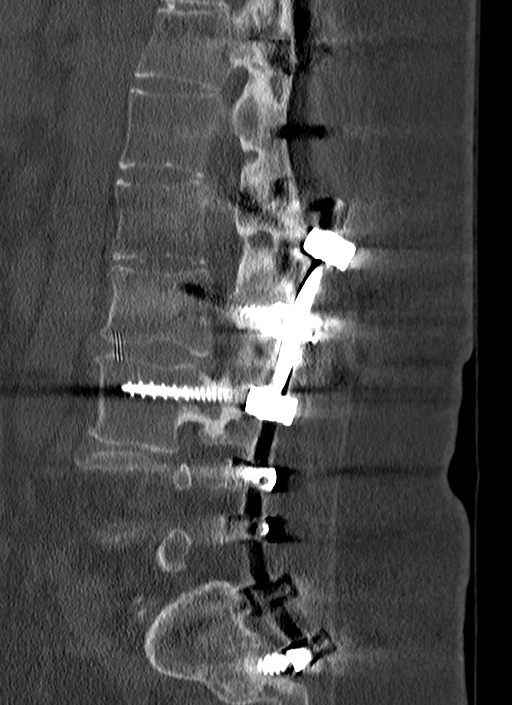
[im 61/92  bone]
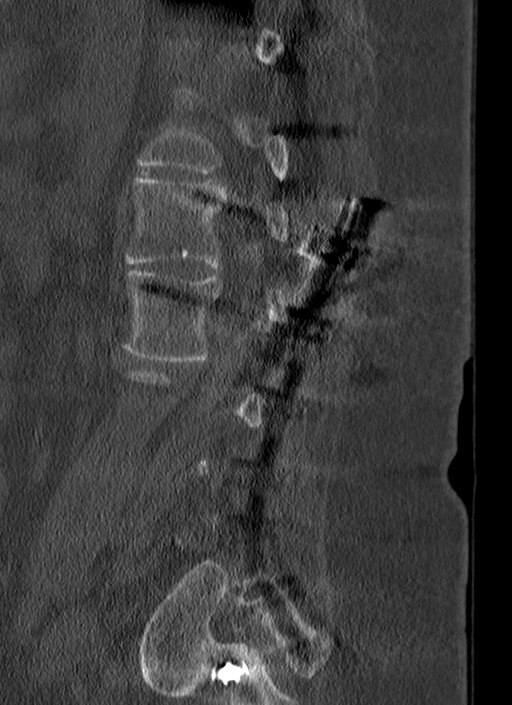

[Series 7: l-spine 2.00 br60 s3 cor bone · coronal · 0.36mm/px · 3 of 92 slices shown]
[im 19/92  bone]
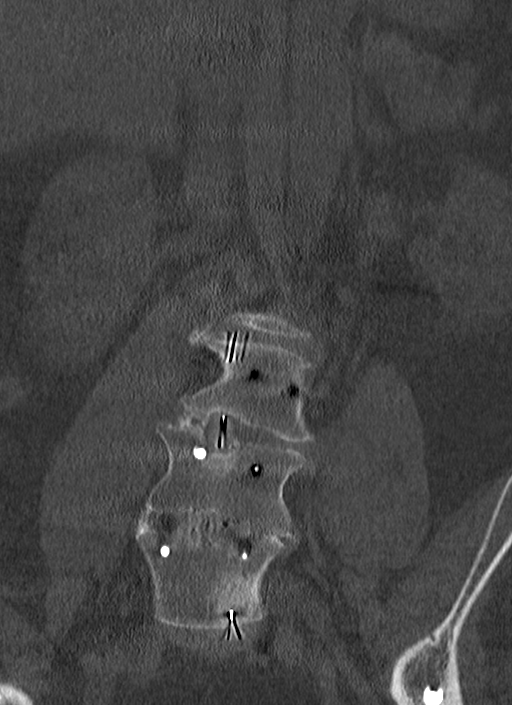
[im 37/92  bone]
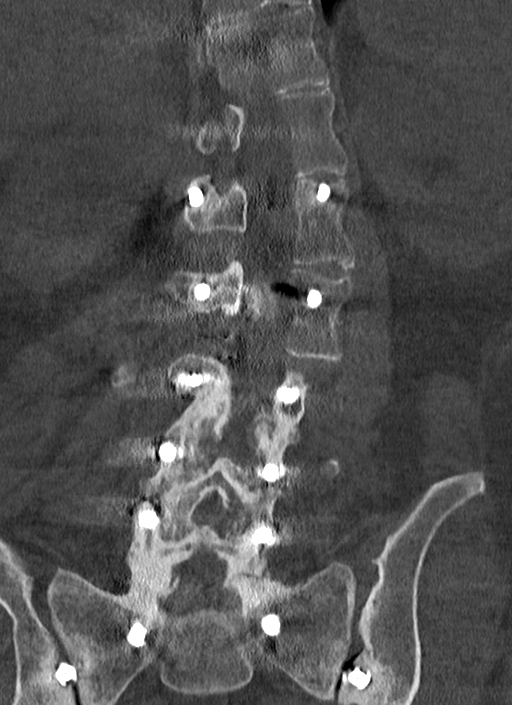
[im 55/92  bone]
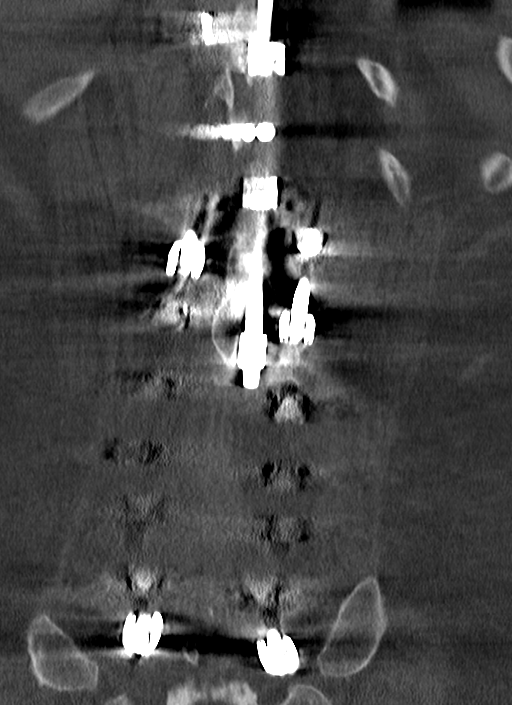

[Series 12: l-spine 0.60 br60 s3 thins bone · axial · 0.36mm/px · z∈[+1384,+1573]mm · 4 of 422 slices shown, 5 images]
[im 53/422  soft-tissue]
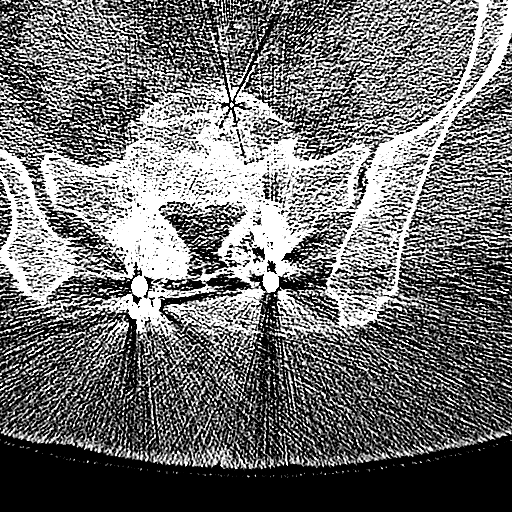
[im 53/422  bone]
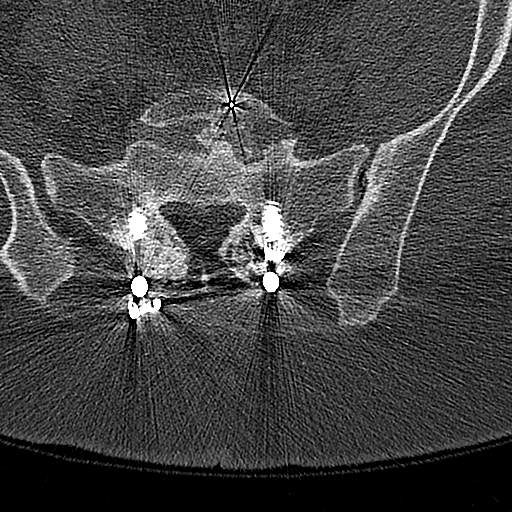
[im 158/422  bone]
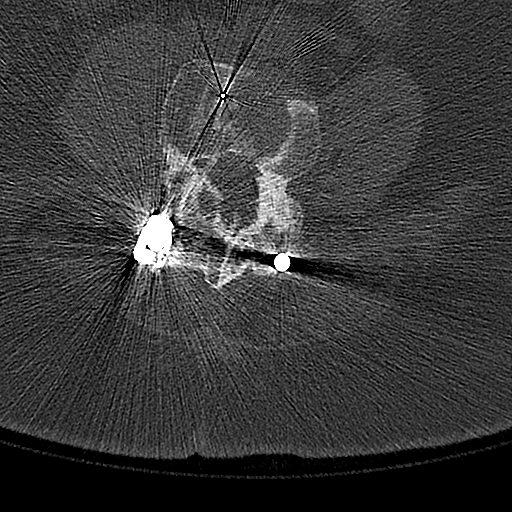
[im 264/422  bone]
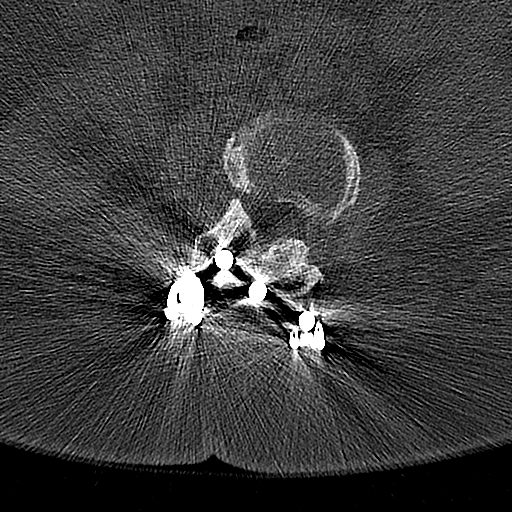
[im 369/422  bone]
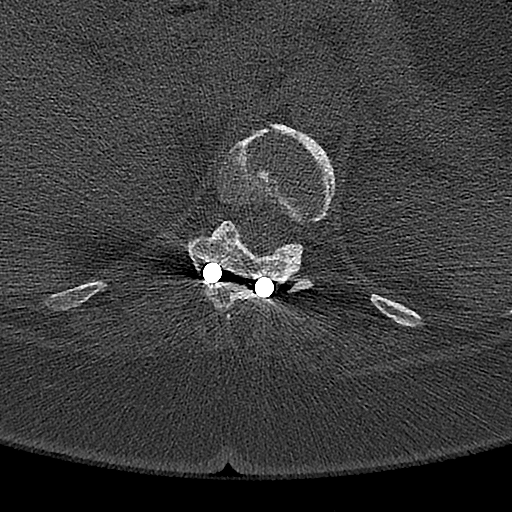

[12 of 33 positions shown; findings below may reference images not displayed]

FINDINGS: Segmentation: 5 lumbar type vertebrae

Alignment: Levoscoliosis

Vertebrae: Scoliosis surgery at the thoracic levels with Harrington
rods. There is posterior-lateral fusion with rod and pedicle screws
from L1 to the pelvis. The right L1 and L2 pedicle screws tips were
extraosseous on preceding abdominal CT but are now encompassed by
bone. Posterior-lateral fusion appears solid throughout the lower
thoracic to lumbar spine. At L5-S1 the posterior-lateral fusion is
right-sided with a chronic appearing lucency on the left. Interbody
grafts present at L2-3 to L5-S1. No acute fracture, discitis, or
aggressive bone lesion. The sacroiliac joints show sclerosis and
vacuum phenomenon on both sides where covered.

Paraspinal and other soft tissues: No evidence of mass or
inflammation

Disc levels: No high-grade foraminal impingement. Hypertrophic bone
and organized graft mildly narrows the left L3-4 foramen.

Ridging and ligamentum flavum thickening at L3-4 causes spinal
stenosis that is at least moderate.
IMPRESSION: 1. Scoliosis surgery with solid arthrodesis from the thoracic spine
to L2. There is posterior-lateral fusion from L1 to the pelvis with
solid lumbar arthrodesis.
2. The tips of the L1 and L2 right pedicle screws were extraosseous
on [[place]] abdominal CT but are incorporated by bone currently.
3. Moderate or advanced spinal stenosis at L3-4 due to endplate
ridging and hypertrophic posterior elements.
4. Sacroiliac osteoarthritic changes.

## 2019-06-28 DIAGNOSIS — M41125 Adolescent idiopathic scoliosis, thoracolumbar region: Secondary | ICD-10-CM | POA: Diagnosis not present

## 2019-06-28 DIAGNOSIS — M438X9 Other specified deforming dorsopathies, site unspecified: Secondary | ICD-10-CM | POA: Diagnosis not present

## 2019-06-28 DIAGNOSIS — G8929 Other chronic pain: Secondary | ICD-10-CM | POA: Diagnosis not present

## 2019-06-28 DIAGNOSIS — M549 Dorsalgia, unspecified: Secondary | ICD-10-CM | POA: Diagnosis not present

## 2019-07-21 DIAGNOSIS — Z79899 Other long term (current) drug therapy: Secondary | ICD-10-CM | POA: Diagnosis not present

## 2019-07-30 ENCOUNTER — Other Ambulatory Visit: Payer: Self-pay

## 2019-07-30 ENCOUNTER — Ambulatory Visit (INDEPENDENT_AMBULATORY_CARE_PROVIDER_SITE_OTHER): Payer: Medicare HMO | Admitting: Family Medicine

## 2019-07-30 ENCOUNTER — Encounter: Payer: Self-pay | Admitting: Family Medicine

## 2019-07-30 DIAGNOSIS — M25562 Pain in left knee: Secondary | ICD-10-CM | POA: Diagnosis not present

## 2019-07-30 MED ORDER — CELECOXIB 200 MG PO CAPS
200.0000 mg | ORAL_CAPSULE | Freq: Two times a day (BID) | ORAL | 6 refills | Status: DC | PRN
Start: 2019-07-30 — End: 2020-04-19

## 2019-07-30 NOTE — Progress Notes (Signed)
Office Visit Note   Patient: Katie Ramirez           Date of Birth: August 17, 1980           MRN: [id number] Visit Date: 07/30/2019 Requested by: Marthenia Rolling, DO [address] [place],  [place] [address] PCP: Marthenia Rolling, DO  Subjective: Chief Complaint  Patient presents with  . Left Knee - Pain    NKI. Pain and tightness in posterior knee, with a "heavy feeling." Possible swelling in the knee. Grinding. Stiffness. No locking. No giving way. Walks with a lean to the left, due to back pain. Asking for a wedge/insert for her shoe.    HPI: She is here with left knee pain.  Symptoms started about 3 weeks ago, no definite injury.  She has been walking a lot trying to lose weight in order to proceed with spine straightening surgery.  She needs to lose about 40 to 50 pounds.  She has lost 10 pounds so far.  She is not sure when her gait increased activity has aggravated her knee.  She feels some pain in the front and stiffness in the back.  No locking or giving way.  She also was told that she leans to the left and might benefit from a lift in her left shoe.              ROS:   All other systems were reviewed and are negative.  Objective: Vital Signs: There were no vitals taken for this visit.  Physical Exam:  General:  Alert and oriented, in no acute distress. Pulm:  Breathing unlabored. Psy:  Normal mood, congruent affect.  Left knee: 1+ effusion with no warmth.  2+ patellofemoral crepitus.  Mild pain with patella compression.  No joint line tenderness, no popliteal cyst. Leg lengths appear slightly unequal with the left shorter than the right.   Imaging: No results found.  Assessment & Plan: 1.  Left knee chondromalacia patella -Celebrex as needed, hinged knee brace for support.  Lift in her left shoe as tolerated. -If knee pain worsens, could contemplate a one-time injection.     Procedures: No procedures performed  No notes on file     PMFS History: Patient Active  Problem List   Diagnosis Date Noted  . Loosening of hardware in spine (HCC) 04/16/2017  . Pseudarthrosis following spinal fusion 04/16/2017  . Chronic pain 08/05/2016  . Degenerative spondylolisthesis 06/12/2016  . Headache 06/30/2015  . Idiopathic scoliosis 02/08/2015  . Obesity 02/08/2015  . Lumbar pain 02/08/2015  . Depression 02/08/2015  . S/P lumbar fusion 09/15/2012   Past Medical History:  Diagnosis Date  . Chronic back pain   . Depression   . Scoliosis     Family History  Problem Relation Age of Onset  . Hypertension Father   . Diabetes Father   . Atrial fibrillation Father   . Down syndrome Sister   . ADD / ADHD Daughter   . Asthma Son   . ADD / ADHD Son     Past Surgical History:  Procedure Laterality Date  . ANTERIOR CERVICAL DECOMP/DISCECTOMY FUSION  12/02/2017  . BACK SURGERY     Harrington rods for scoliosis  . BTL    . CESAREAN SECTION    . ENDOMETRIAL ABLATION    . SPINE SURGERY  2013   fusion by Dr Danielle Dess, s Rod in childhood at [facility]   Social History   Occupational History  . Not on file  Tobacco  Use  . Smoking status: Never Smoker  . Smokeless tobacco: Never Used  Substance and Sexual Activity  . Alcohol use: No  . Drug use: No  . Sexual activity: Never

## 2019-08-20 DIAGNOSIS — G43909 Migraine, unspecified, not intractable, without status migrainosus: Secondary | ICD-10-CM | POA: Diagnosis not present

## 2019-08-20 DIAGNOSIS — Z79899 Other long term (current) drug therapy: Secondary | ICD-10-CM | POA: Diagnosis not present

## 2019-08-20 DIAGNOSIS — G894 Chronic pain syndrome: Secondary | ICD-10-CM | POA: Diagnosis not present

## 2019-08-20 DIAGNOSIS — M961 Postlaminectomy syndrome, not elsewhere classified: Secondary | ICD-10-CM | POA: Diagnosis not present

## 2019-08-25 DIAGNOSIS — E538 Deficiency of other specified B group vitamins: Secondary | ICD-10-CM | POA: Diagnosis not present

## 2019-09-20 DIAGNOSIS — Z79899 Other long term (current) drug therapy: Secondary | ICD-10-CM | POA: Diagnosis not present

## 2019-09-20 DIAGNOSIS — G894 Chronic pain syndrome: Secondary | ICD-10-CM | POA: Diagnosis not present

## 2019-09-20 DIAGNOSIS — M961 Postlaminectomy syndrome, not elsewhere classified: Secondary | ICD-10-CM | POA: Diagnosis not present

## 2019-09-20 DIAGNOSIS — F1120 Opioid dependence, uncomplicated: Secondary | ICD-10-CM | POA: Diagnosis not present

## 2019-10-07 ENCOUNTER — Ambulatory Visit: Payer: Medicare HMO | Admitting: Skilled Nursing Facility1

## 2019-10-14 ENCOUNTER — Other Ambulatory Visit: Payer: Self-pay | Admitting: Family Medicine

## 2019-10-21 DIAGNOSIS — G894 Chronic pain syndrome: Secondary | ICD-10-CM | POA: Diagnosis not present

## 2019-10-21 DIAGNOSIS — Z79899 Other long term (current) drug therapy: Secondary | ICD-10-CM | POA: Diagnosis not present

## 2019-10-21 DIAGNOSIS — G43909 Migraine, unspecified, not intractable, without status migrainosus: Secondary | ICD-10-CM | POA: Diagnosis not present

## 2019-10-21 DIAGNOSIS — M961 Postlaminectomy syndrome, not elsewhere classified: Secondary | ICD-10-CM | POA: Diagnosis not present

## 2019-10-25 DIAGNOSIS — M4722 Other spondylosis with radiculopathy, cervical region: Secondary | ICD-10-CM | POA: Diagnosis not present

## 2019-10-25 DIAGNOSIS — M19011 Primary osteoarthritis, right shoulder: Secondary | ICD-10-CM | POA: Diagnosis not present

## 2019-10-25 DIAGNOSIS — G8929 Other chronic pain: Secondary | ICD-10-CM | POA: Diagnosis not present

## 2019-10-25 DIAGNOSIS — M5416 Radiculopathy, lumbar region: Secondary | ICD-10-CM | POA: Diagnosis not present

## 2019-10-25 DIAGNOSIS — M41125 Adolescent idiopathic scoliosis, thoracolumbar region: Secondary | ICD-10-CM | POA: Diagnosis not present

## 2019-10-25 DIAGNOSIS — Z6841 Body Mass Index (BMI) 40.0 and over, adult: Secondary | ICD-10-CM | POA: Diagnosis not present

## 2019-10-25 DIAGNOSIS — M438X9 Other specified deforming dorsopathies, site unspecified: Secondary | ICD-10-CM | POA: Diagnosis not present

## 2019-11-02 ENCOUNTER — Other Ambulatory Visit: Payer: Self-pay | Admitting: Orthopaedic Surgery of the Spine

## 2019-11-02 DIAGNOSIS — M4722 Other spondylosis with radiculopathy, cervical region: Secondary | ICD-10-CM

## 2019-11-02 DIAGNOSIS — M19011 Primary osteoarthritis, right shoulder: Secondary | ICD-10-CM

## 2019-11-23 DIAGNOSIS — Z79899 Other long term (current) drug therapy: Secondary | ICD-10-CM | POA: Diagnosis not present

## 2019-11-23 DIAGNOSIS — G894 Chronic pain syndrome: Secondary | ICD-10-CM | POA: Diagnosis not present

## 2019-11-23 DIAGNOSIS — M961 Postlaminectomy syndrome, not elsewhere classified: Secondary | ICD-10-CM | POA: Diagnosis not present

## 2019-11-23 DIAGNOSIS — G43909 Migraine, unspecified, not intractable, without status migrainosus: Secondary | ICD-10-CM | POA: Diagnosis not present

## 2019-11-25 ENCOUNTER — Ambulatory Visit
Admission: RE | Admit: 2019-11-25 | Discharge: 2019-11-25 | Disposition: A | Discharge status: Home / Self Care | Payer: Medicare HMO | Source: Ambulatory Visit | Attending: Orthopaedic Surgery of the Spine | Admitting: Orthopaedic Surgery of the Spine

## 2019-11-25 ENCOUNTER — Other Ambulatory Visit: Payer: Self-pay

## 2019-11-25 DIAGNOSIS — Z981 Arthrodesis status: Secondary | ICD-10-CM | POA: Diagnosis not present

## 2019-11-25 DIAGNOSIS — M25511 Pain in right shoulder: Secondary | ICD-10-CM | POA: Diagnosis not present

## 2019-11-25 DIAGNOSIS — M19011 Primary osteoarthritis, right shoulder: Secondary | ICD-10-CM

## 2019-11-25 DIAGNOSIS — M4802 Spinal stenosis, cervical region: Secondary | ICD-10-CM | POA: Diagnosis not present

## 2019-11-25 DIAGNOSIS — M5021 Other cervical disc displacement,  high cervical region: Secondary | ICD-10-CM | POA: Diagnosis not present

## 2019-11-25 DIAGNOSIS — M4722 Other spondylosis with radiculopathy, cervical region: Secondary | ICD-10-CM

## 2019-11-25 IMAGING — MR MR CERVICAL SPINE W/O CM
4 of 5 series · 28 of 48 positions shown · non-contrast
Comparison: [DATE] scoliosis radiographs. [DATE] MRI
cervical spine and prior.

CLINICAL DATA: Neck pain

EXAM:
MRI CERVICAL SPINE WITHOUT CONTRAST
TECHNIQUE: Multiplanar, multisequence MR imaging of the cervical spine was
performed. No intravenous contrast was administered.

[Series 3: T2 · sagittal · 3.0mm · 0.66mm/px · 6 of 14 slices shown (1 of 2)]
[im 1/14]
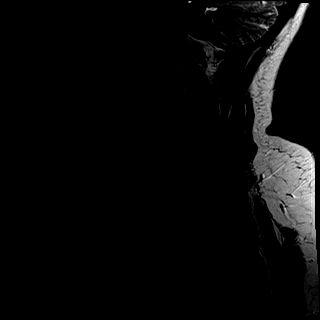
[im 3/14]
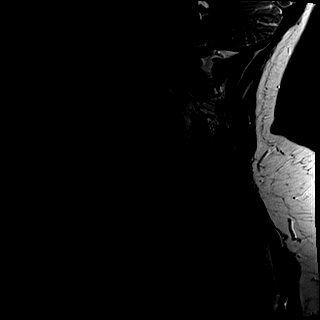
[im 6/14]
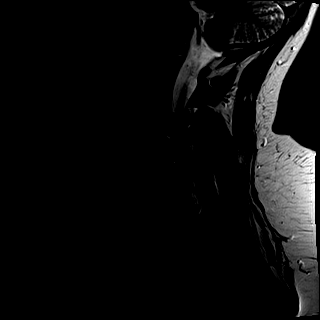
[im 8/14]
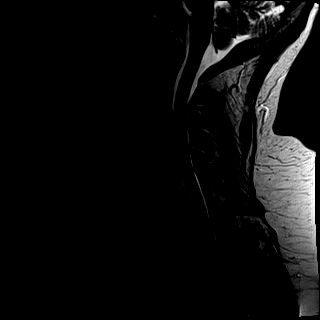
[im 11/14]
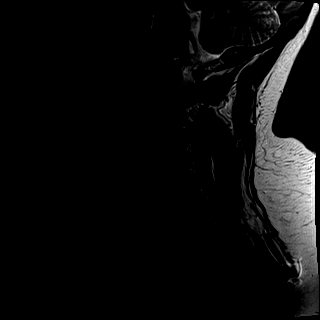
[im 14/14]
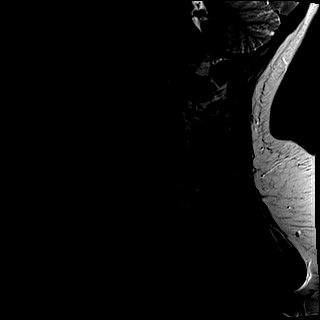

[Series 4: T1 · sagittal · 3.0mm · 0.41mm/px · 7 of 14 slices shown]
[im 1/14]
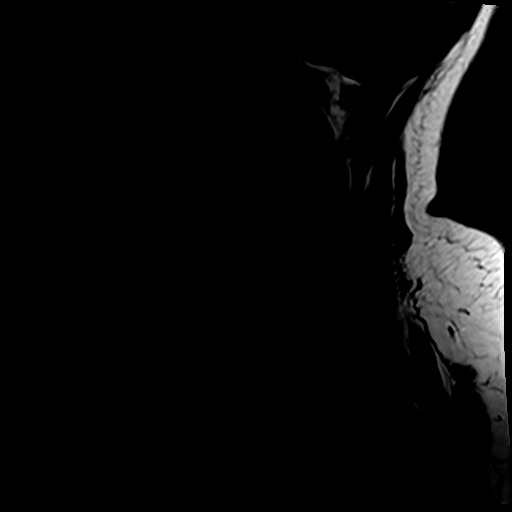
[im 3/14]
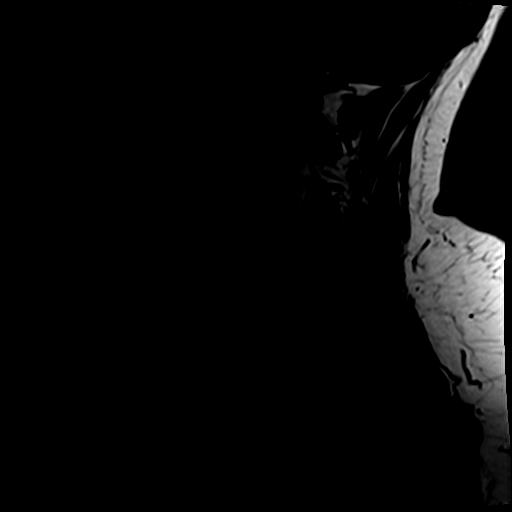
[im 5/14]
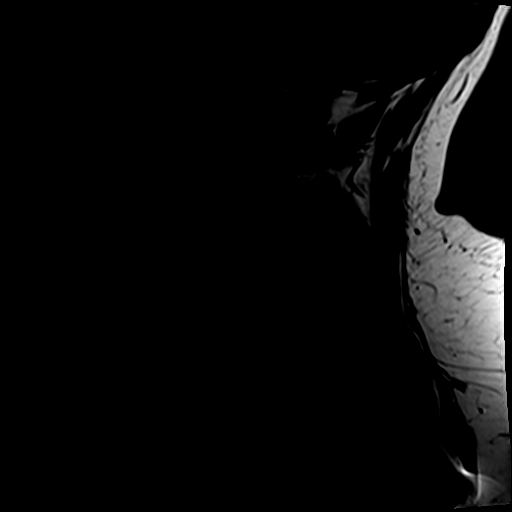
[im 7/14]
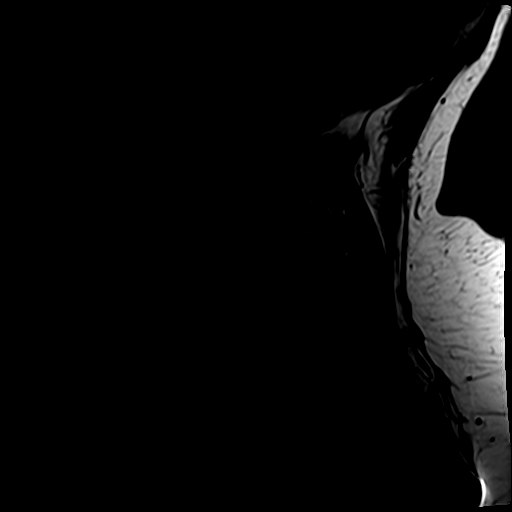
[im 9/14]
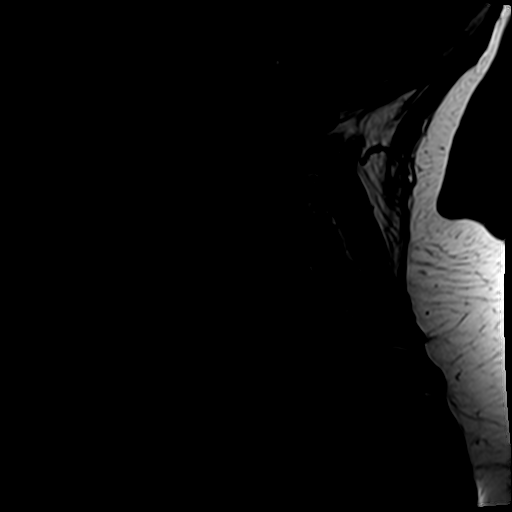
[im 11/14]
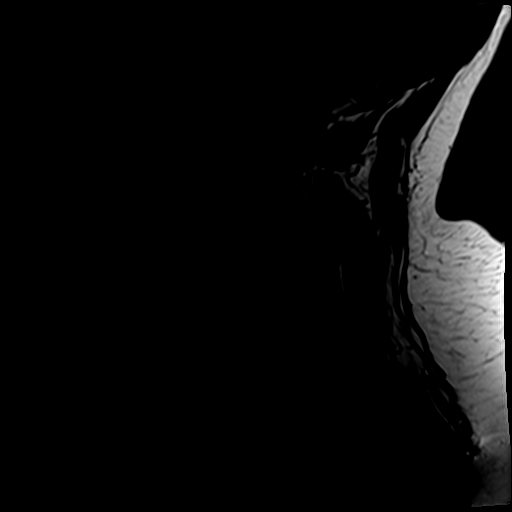
[im 14/14]
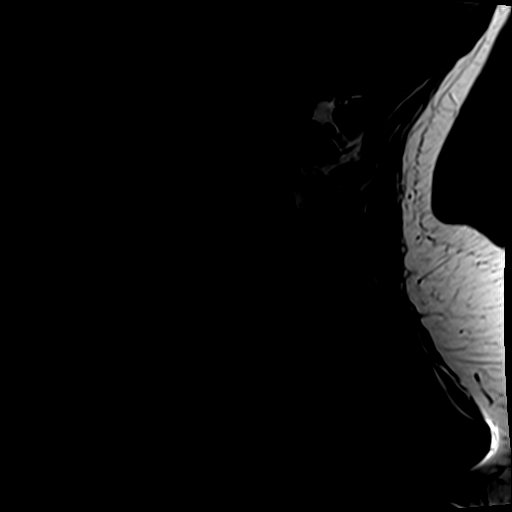

[Series 5: tir sag · sagittal · 3.0mm · 0.41mm/px · 7 of 14 slices shown]
[im 1/14]
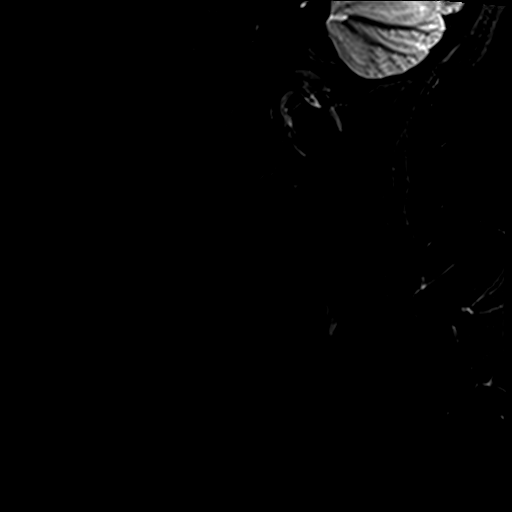
[im 3/14]
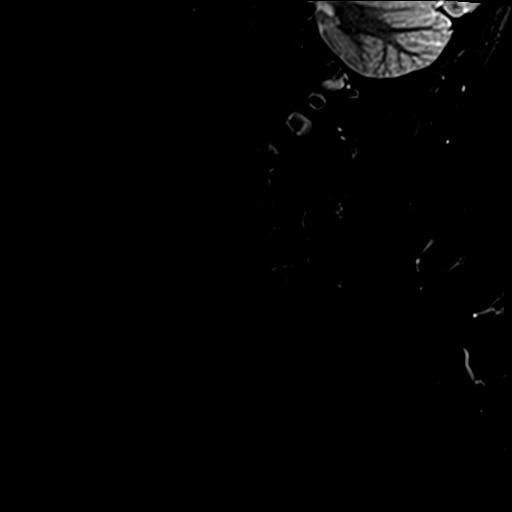
[im 5/14]
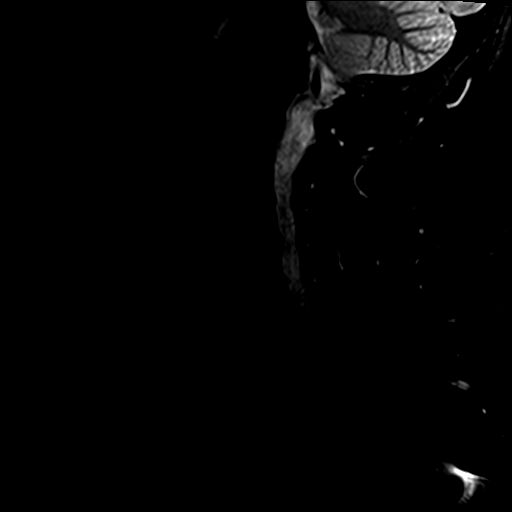
[im 7/14]
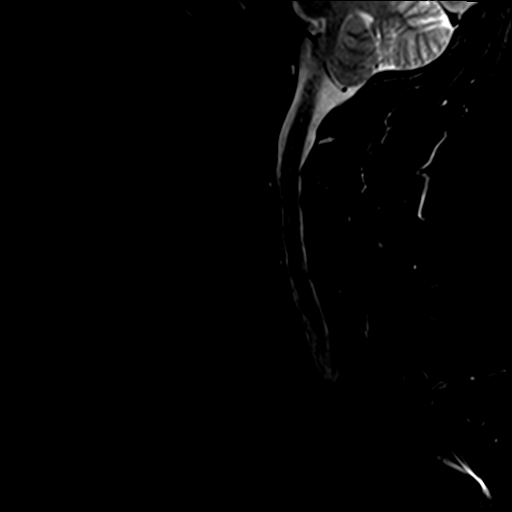
[im 9/14]
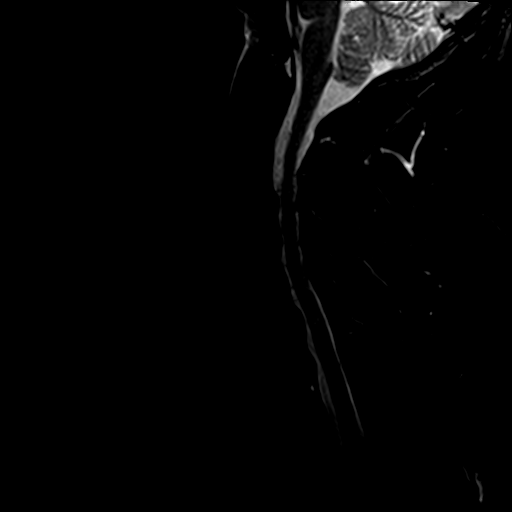
[im 11/14]
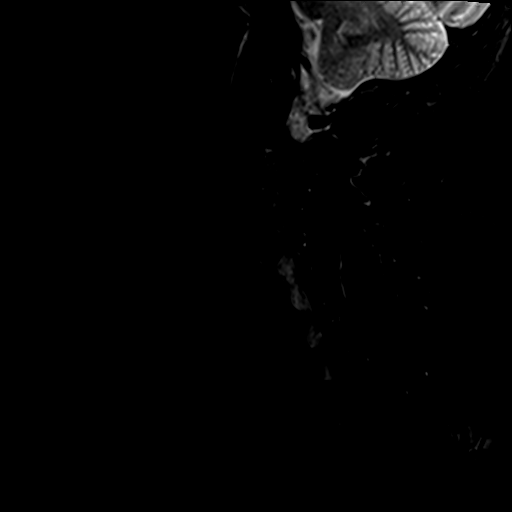
[im 14/14]
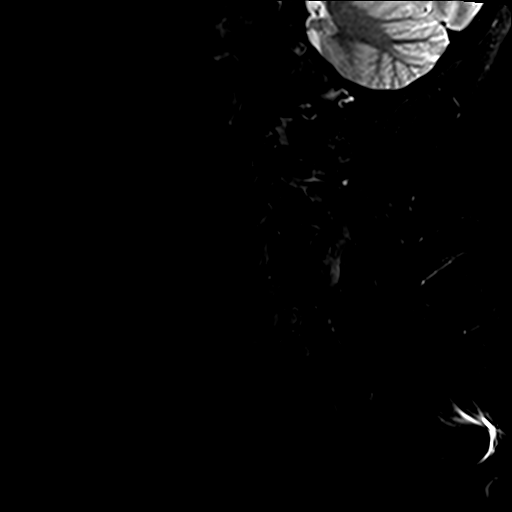

[Series 7: T2 · axial · 3.0mm · 0.70mm/px · z∈[-75,+31]mm · 8 of 30 slices shown (2 of 2)]
[im 1/30]
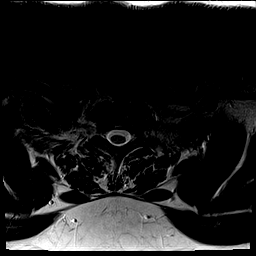
[im 5/30]
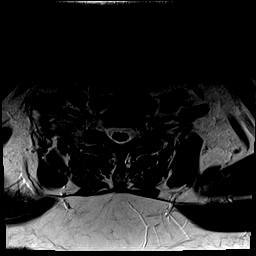
[im 9/30]
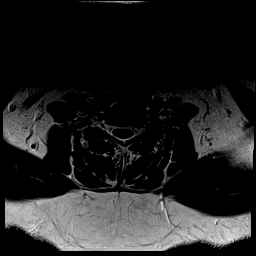
[im 14/30]
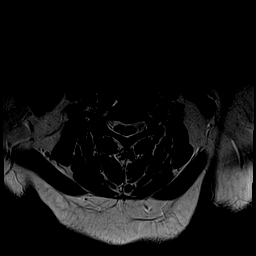
[im 16/30]
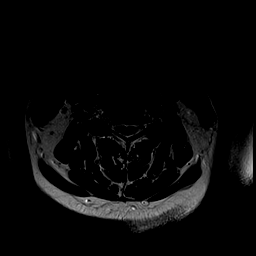
[im 21/30]
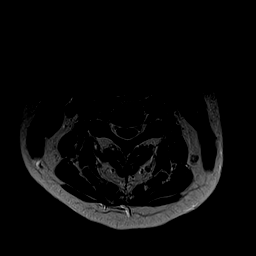
[im 25/30]
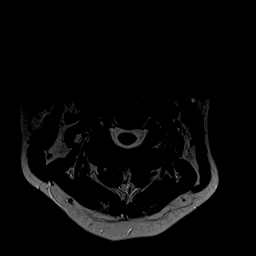
[im 30/30]
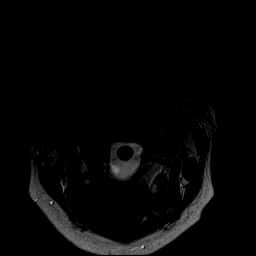

[28 of 48 positions shown; findings below may reference images not displayed]

FINDINGS: Alignment: Straightening of lordosis. Sequela of C4-7 ACDF.
Associated susceptibility artifact limits evaluation.

Vertebrae: Normal bone marrow signal intensity. No focal osseous
lesion.

Cord: Normal signal and morphology.

Posterior Fossa, vertebral arteries: Negative.

Disc levels: Multilevel desiccation.

C2-3: No significant disc bulge, spinal canal or neural foraminal
narrowing.

C3-4: Disc osteophyte complex with small central protrusion.
Uncovertebral and bilateral facet degenerative spurring. Mild spinal
canal and right neural foraminal narrowing. Patent left neural
foramen.

C4-5: Sequela of fusion. Patent spinal canal. Mild bilateral neural
foraminal narrowing.

C5-6: Sequela of fusion. Patent spinal canal and bilateral neural
foramen.

C6-7: Sequela of fusion. Patent spinal canal and right neural
foramen. Mild left neural foraminal narrowing.

C7-T1: No significant disc bulge, spinal canal or neural foraminal
narrowing.

Paraspinal tissues: Negative.
IMPRESSION: 1. Sequela of C4-7 ACDF. Patent spinal canal. Mild bilateral C4-5
and left C6-7 neural foraminal narrowing.
2. Mild spinal canal and right neural foraminal narrowing at the
C3-4 level.

## 2019-11-25 IMAGING — MR MR SHOULDER*R* W/O CM
5 series · 36 of 40 positions shown · non-contrast
Comparison: None.

CLINICAL DATA: Right shoulder pain.  No time course given.

EXAM:
MRI OF THE RIGHT SHOULDER WITHOUT CONTRAST
TECHNIQUE: Multiplanar, multisequence MR imaging of the shoulder was performed.
No intravenous contrast was administered.

[Series 3: PD fat-sat · axial · 4.0mm · 0.55mm/px · z∈[-23,+57]mm · 9 of 20 slices shown (1 of 2)]
[im 1/20]
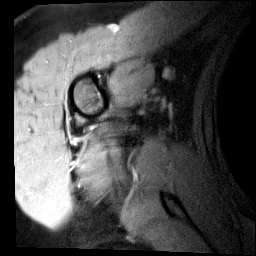
[im 3/20]
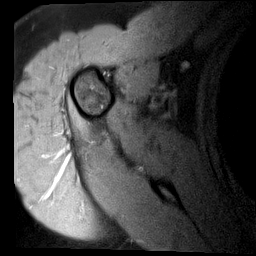
[im 5/20]
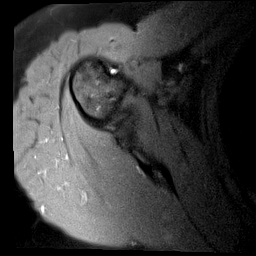
[im 8/20]
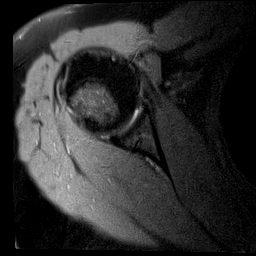
[im 10/20]
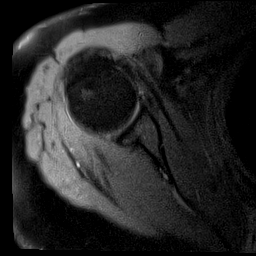
[im 12/20]
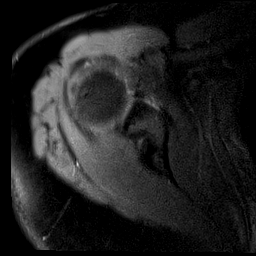
[im 15/20]
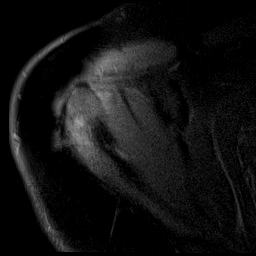
[im 17/20]
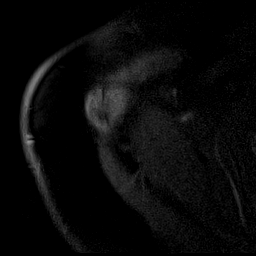
[im 20/20]
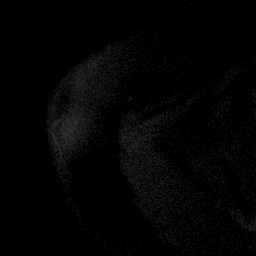

[Series 4: T2 fat-sat · oblique · 4.0mm · 0.55mm/px · 8 of 18 slices shown (1 of 2)]
[im 1/18]
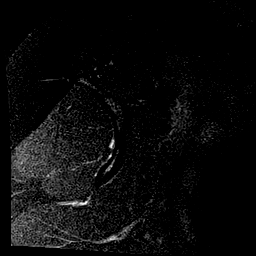
[im 3/18]
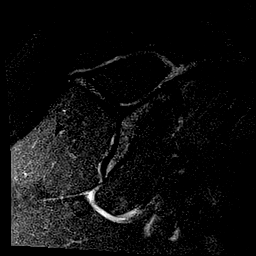
[im 5/18]
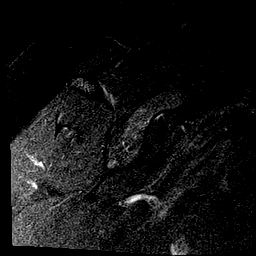
[im 8/18]
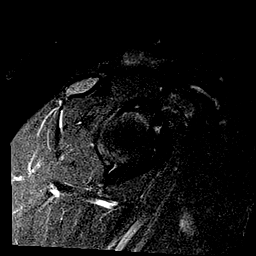
[im 10/18]
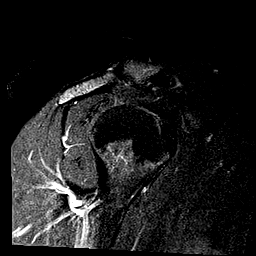
[im 13/18]
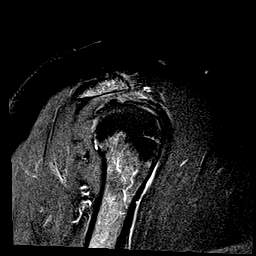
[im 15/18]
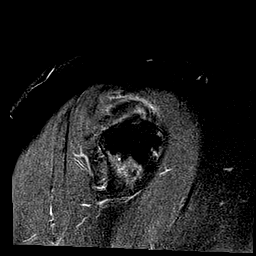
[im 18/18]
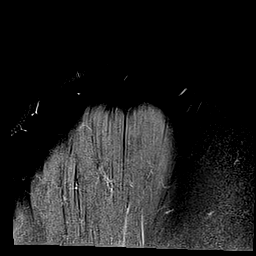

[Series 5: T1 · oblique · 4.0mm · 0.27mm/px · 4 of 18 slices shown]
[im 1/18]
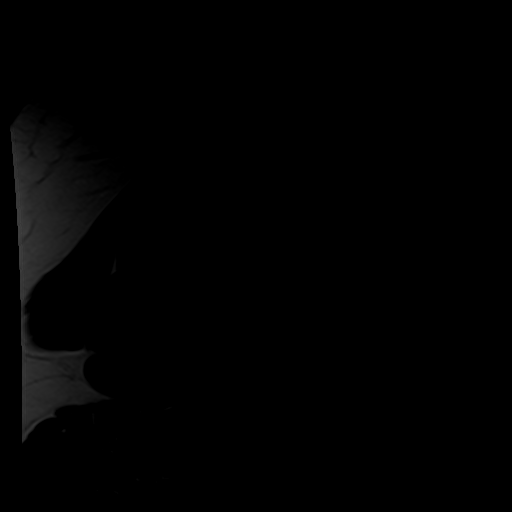
[im 3/18]
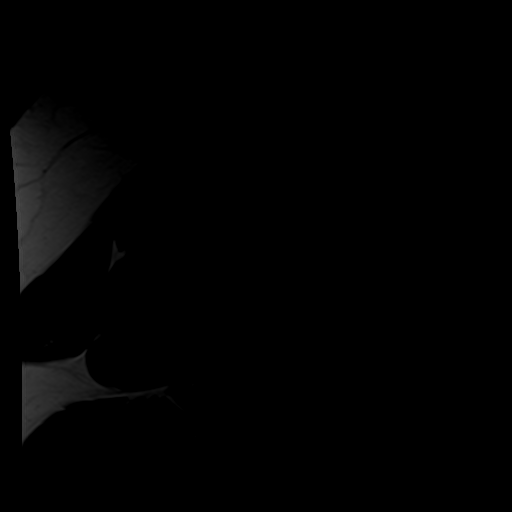
[im 5/18]
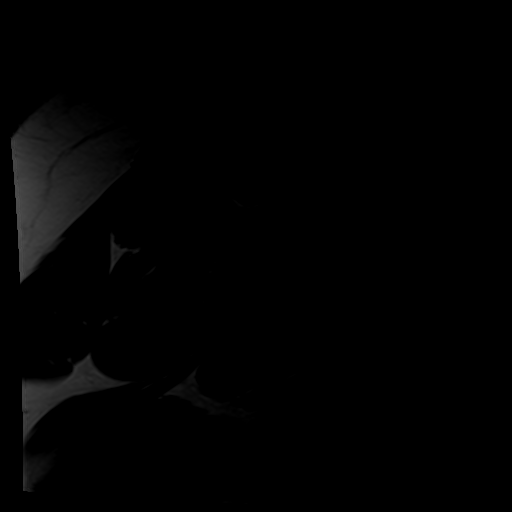
[im 8/18]
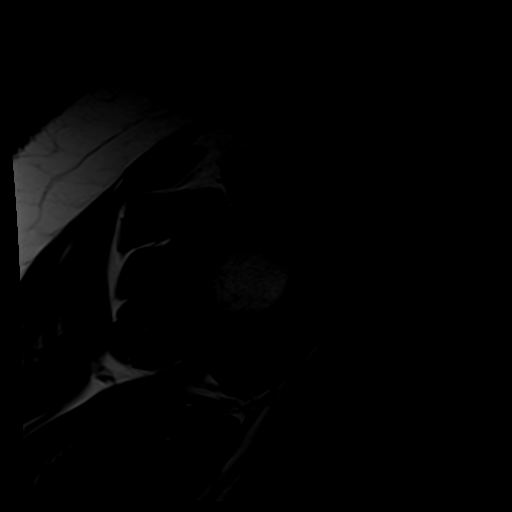

[Series 6: T2 fat-sat · oblique · 4.0mm · 0.55mm/px · 7 of 16 slices shown (2 of 2)]
[im 1/16]
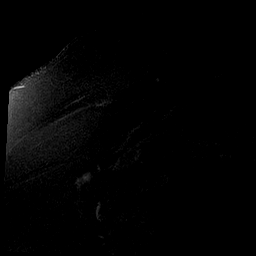
[im 3/16]
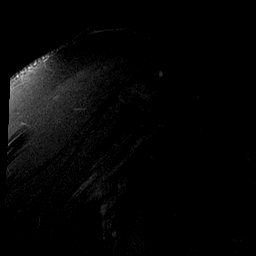
[im 6/16]
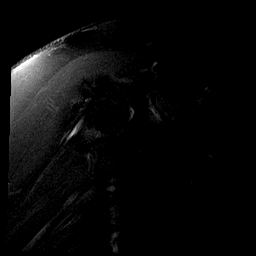
[im 8/16]
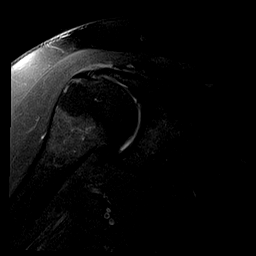
[im 11/16]
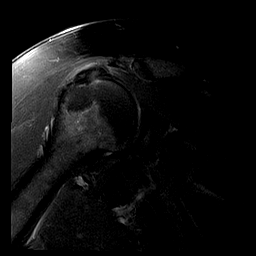
[im 13/16]
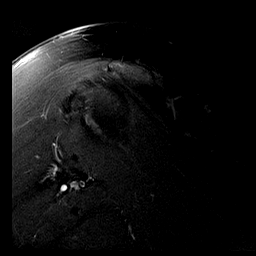
[im 16/16]
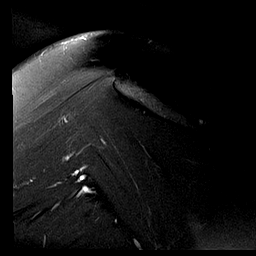

[Series 7: PD fat-sat · oblique · 4.0mm · 0.27mm/px · 8 of 18 slices shown (2 of 2)]
[im 1/18]
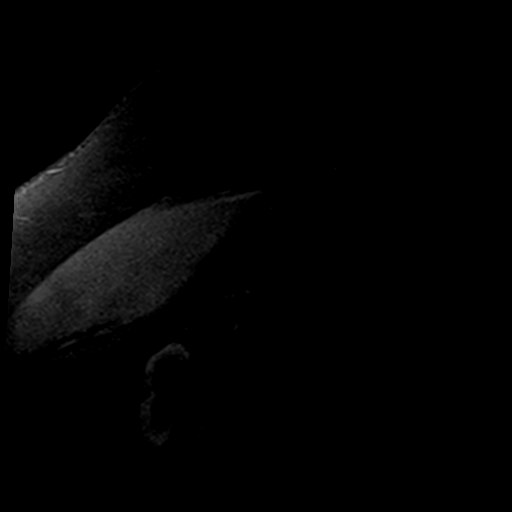
[im 3/18]
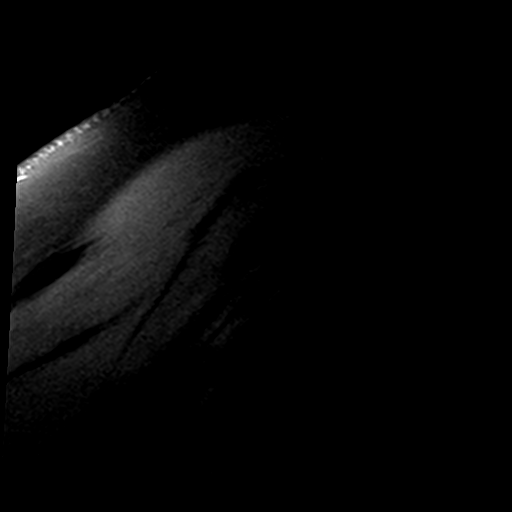
[im 5/18]
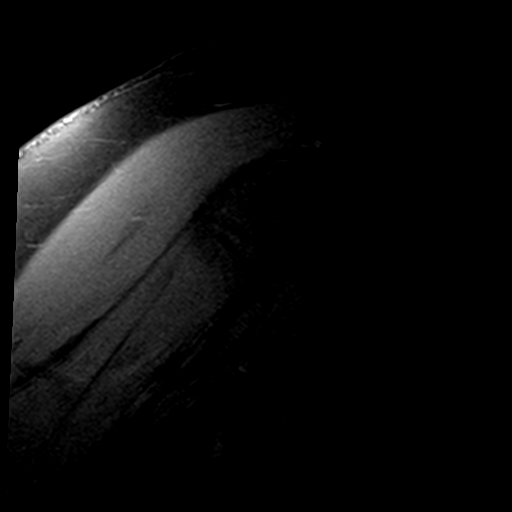
[im 8/18]
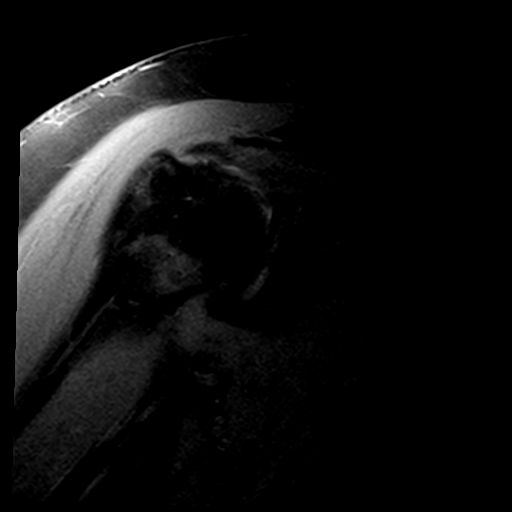
[im 10/18]
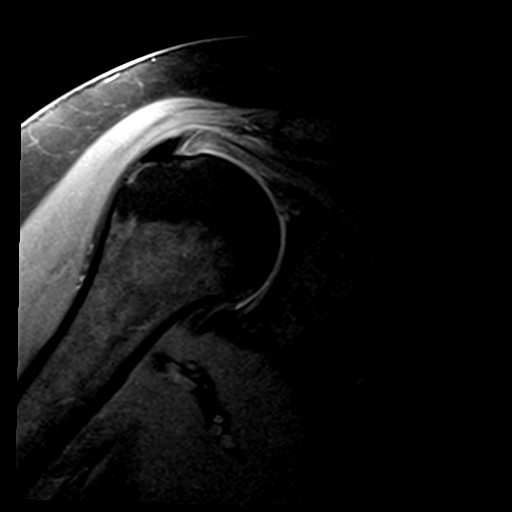
[im 13/18]
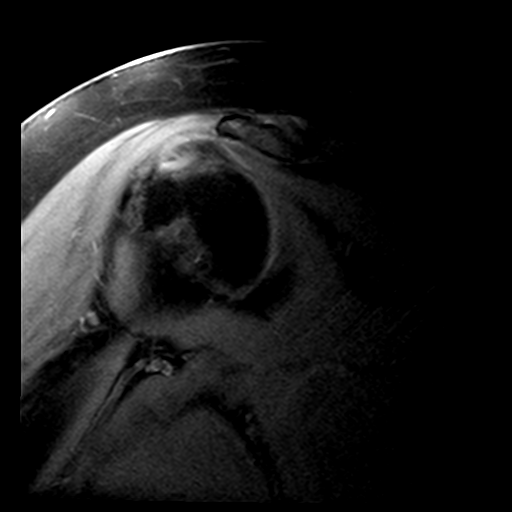
[im 15/18]
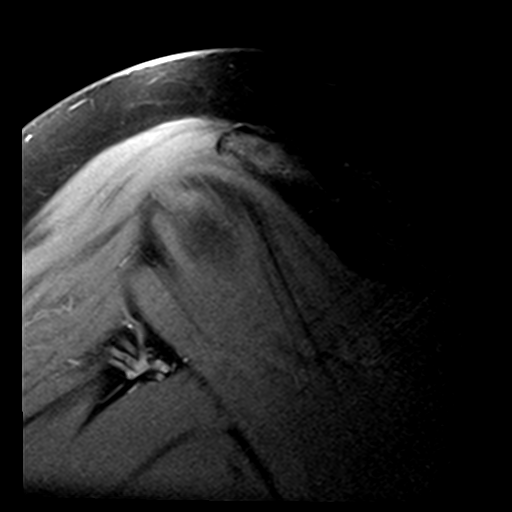
[im 18/18]
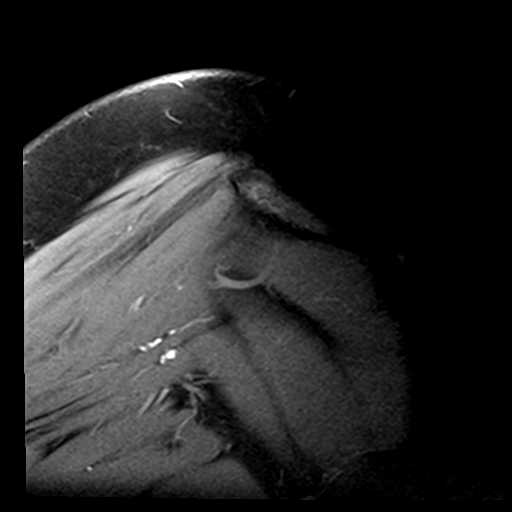

[36 of 40 positions shown; findings below may reference images not displayed]

FINDINGS: Rotator cuff: Moderate supraspinatus tendinopathy/tendinosis with
interstitial tears and mild bursal surface and articular surface
fraying and fibrillation. No partial or full-thickness rotator cuff
tear is identified.

Muscles:  Unremarkable.

Biceps long head:  Intact

Acromioclavicular Joint: No degenerative changes. Type 1 acromion.
No lateral downsloping or undersurface spurring.

Glenohumeral Joint: Normal appearing articular cartilage. Small
joint effusion noted.

Labrum:  No definite labral tears.

Bones:  No acute bony findings.

Other: Mild subacromial/subdeltoid bursitis.
IMPRESSION: 1. Moderate supraspinatus tendinopathy/tendinosis with interstitial
tears and mild bursal surface and articular surface fraying and
fibrillation. No partial or full-thickness rotator cuff tear.
2. Intact long head biceps tendon and glenoid labrum.
3. No significant findings for bony impingement.
4. Mild subacromial/subdeltoid bursitis.

## 2019-12-22 ENCOUNTER — Other Ambulatory Visit: Payer: Self-pay | Admitting: Orthopaedic Surgery of the Spine

## 2020-01-05 ENCOUNTER — Other Ambulatory Visit: Payer: Self-pay | Admitting: Orthopaedic Surgery of the Spine

## 2020-01-05 DIAGNOSIS — M438X9 Other specified deforming dorsopathies, site unspecified: Secondary | ICD-10-CM

## 2020-01-19 ENCOUNTER — Other Ambulatory Visit: Payer: Self-pay

## 2020-01-19 ENCOUNTER — Ambulatory Visit
Admission: RE | Admit: 2020-01-19 | Discharge: 2020-01-19 | Disposition: A | Discharge status: Home / Self Care | Payer: Medicare HMO | Source: Ambulatory Visit | Attending: Orthopaedic Surgery of the Spine | Admitting: Orthopaedic Surgery of the Spine

## 2020-01-19 ENCOUNTER — Ambulatory Visit (INDEPENDENT_AMBULATORY_CARE_PROVIDER_SITE_OTHER): Payer: Medicare Other | Admitting: Family Medicine

## 2020-01-19 ENCOUNTER — Encounter: Payer: Self-pay | Admitting: Family Medicine

## 2020-01-19 ENCOUNTER — Other Ambulatory Visit: Payer: Medicare HMO

## 2020-01-19 DIAGNOSIS — R200 Anesthesia of skin: Secondary | ICD-10-CM | POA: Diagnosis not present

## 2020-01-19 DIAGNOSIS — M25511 Pain in right shoulder: Secondary | ICD-10-CM | POA: Diagnosis not present

## 2020-01-19 DIAGNOSIS — M438X9 Other specified deforming dorsopathies, site unspecified: Secondary | ICD-10-CM

## 2020-01-19 DIAGNOSIS — G8929 Other chronic pain: Secondary | ICD-10-CM | POA: Diagnosis not present

## 2020-01-19 IMAGING — CT CT T SPINE W/O CM
3 of 6 series · 12 of 33 positions shown, 14 images · non-contrast
Comparison: Radiography [DATE].  CT [DATE].

CLINICAL DATA: Scoliosis with Harrington rod treatment. Shoulder
pain. Arm pain. Hip and leg pain.

EXAM:
CT THORACIC AND LUMBAR SPINE WITHOUT CONTRAST
TECHNIQUE: Multidetector CT imaging of the thoracic and lumbar spine was
performed without contrast. Multiplanar CT image reconstructions
were also generated.

[Series 6: t-spine/lspine 2.00 br40 s3 · axial · 0.36mm/px · z∈[-1337,-970]mm · 5 of 276 slices shown, 7 images]
[im 46/276  soft-tissue]
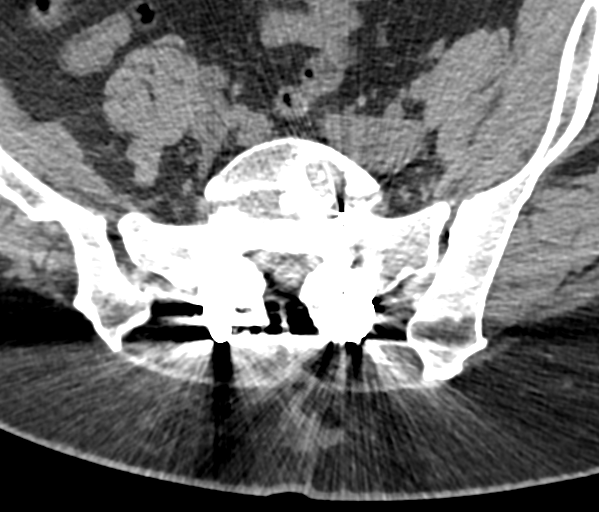
[im 46/276  bone]
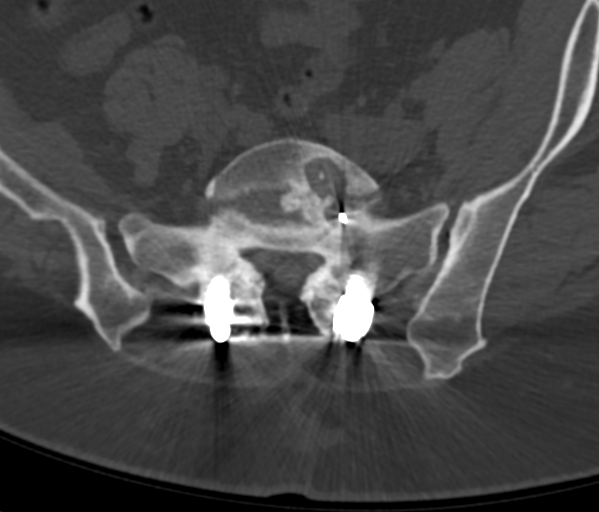
[im 92/276  bone]
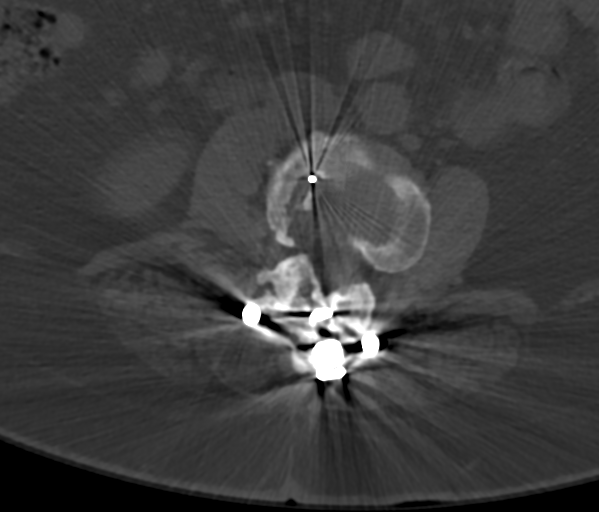
[im 138/276  bone]
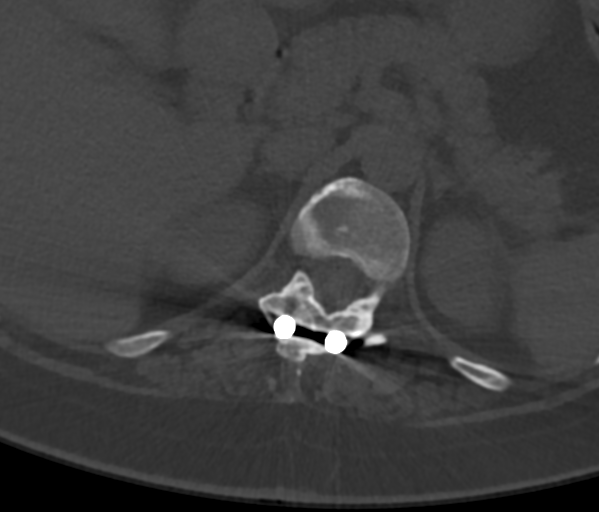
[im 184/276  bone]
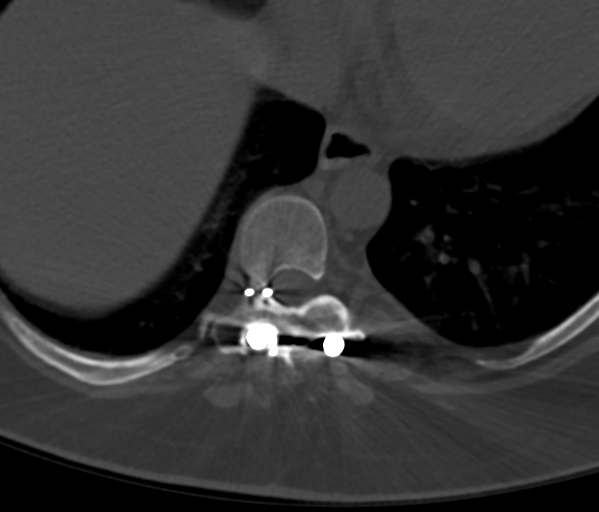
[im 230/276  soft-tissue]
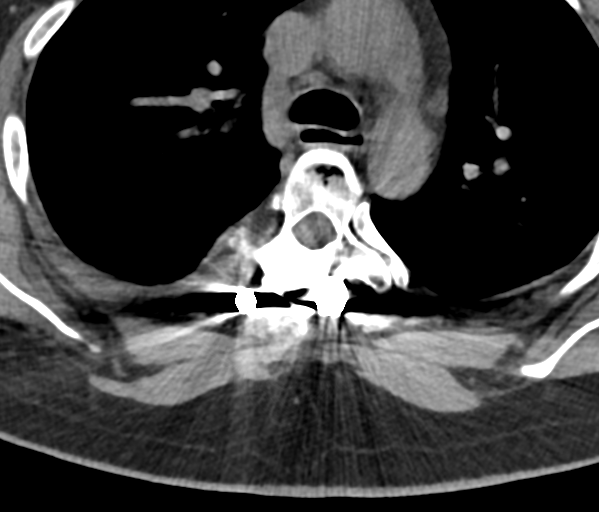
[im 230/276  bone]
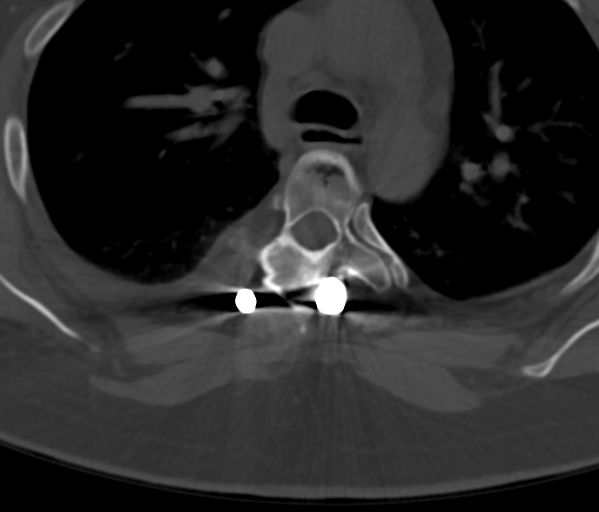

[Series 8: t-spine/lspine 2.00 br60 s3 · axial · 0.36mm/px · z∈[-1337,-970]mm · 5 of 276 slices shown]
[im 46/276  bone]
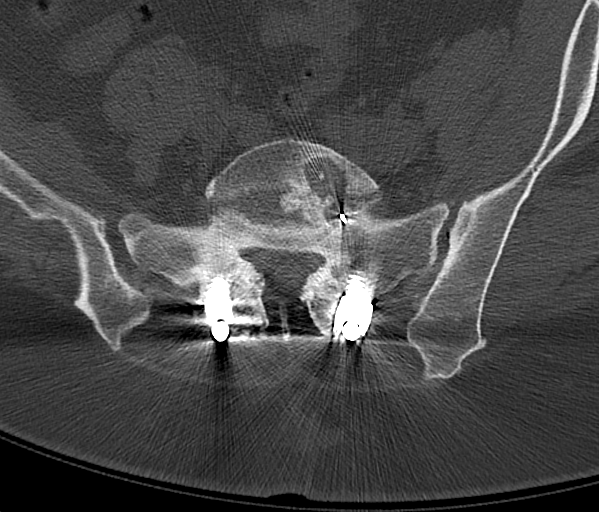
[im 92/276  bone]
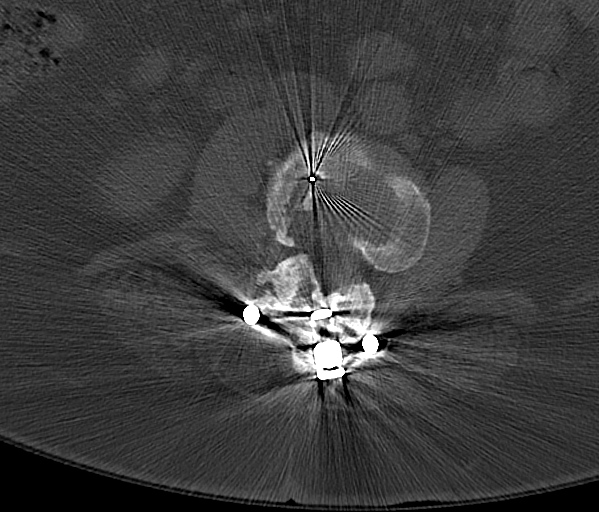
[im 138/276  bone]
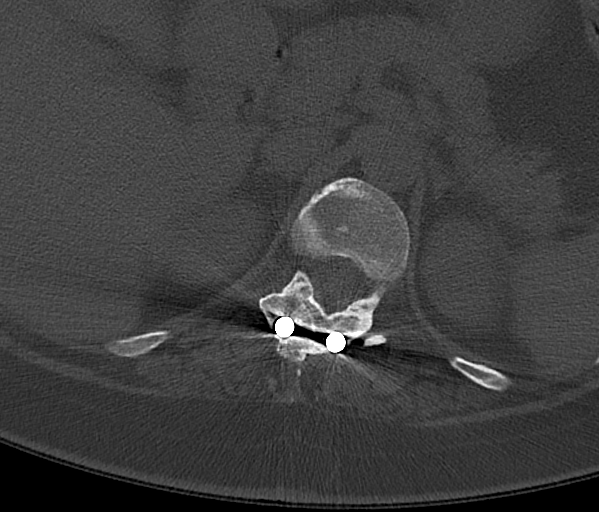
[im 184/276  bone]
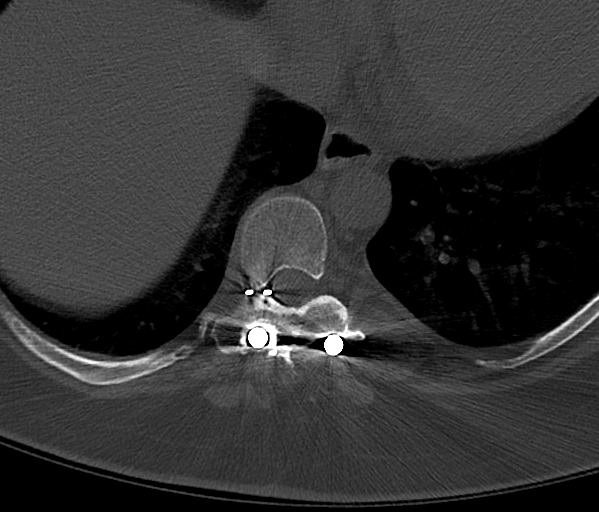
[im 230/276  bone]
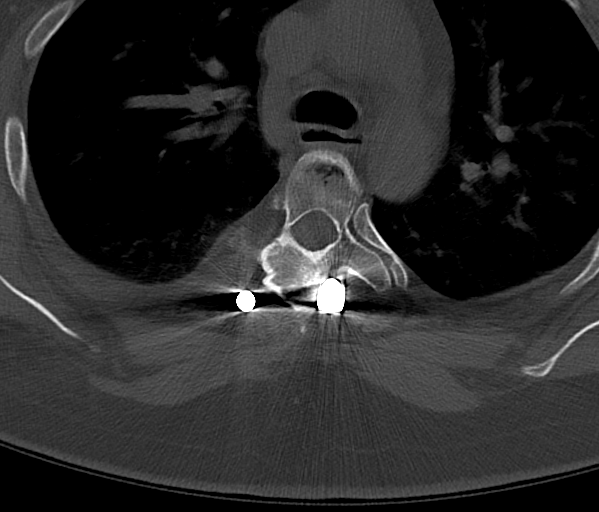

[Series 1004: t-spine/lspine 2.00 br60 s3_new · coronal · 0.46mm/px · 2 of 598 slices shown]
[im 200/598  bone]
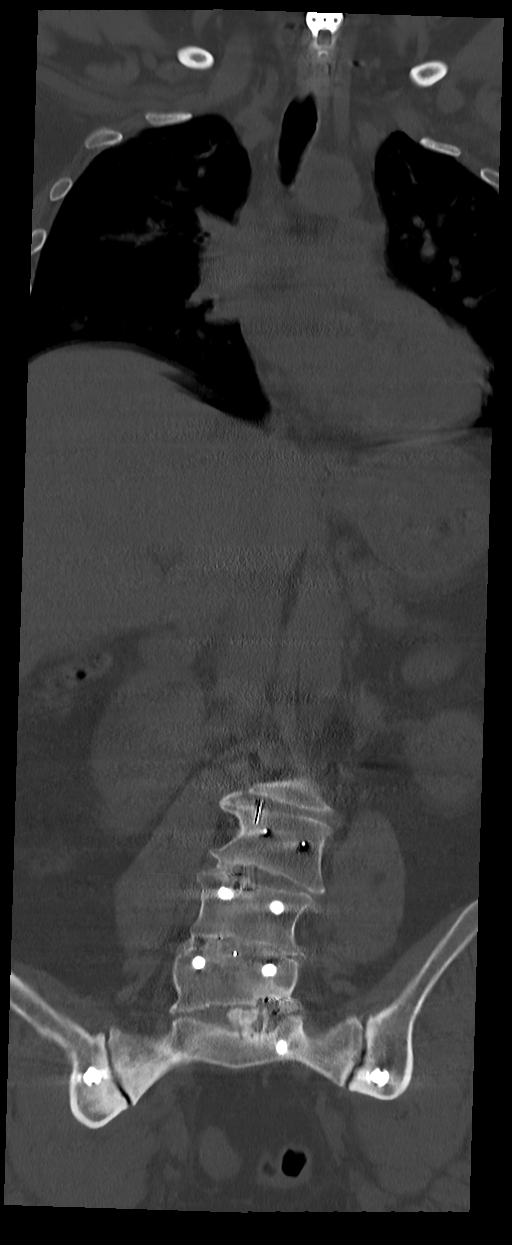
[im 399/598  bone]
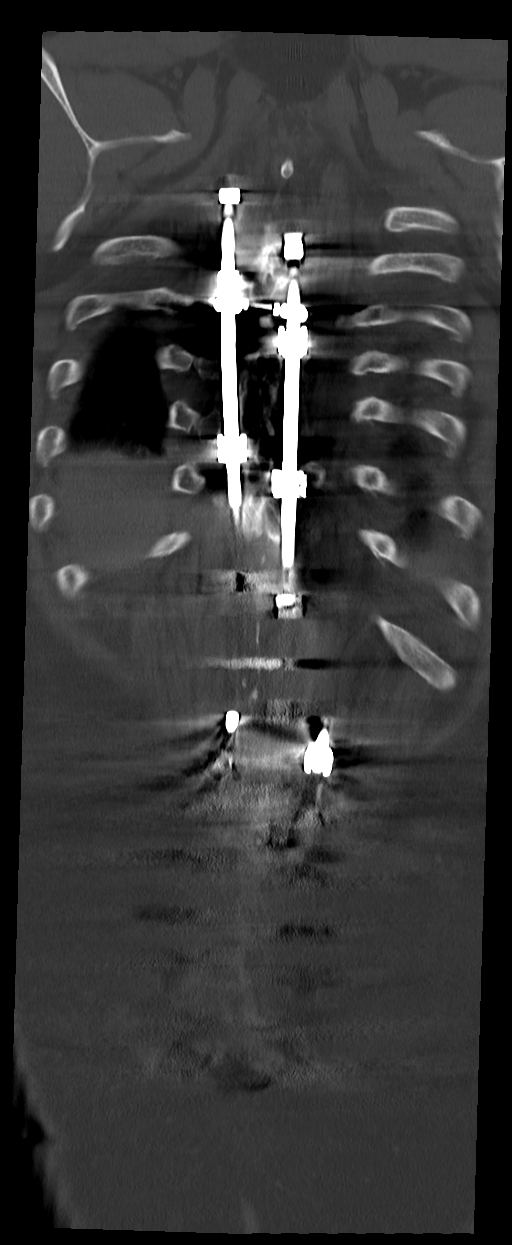

[12 of 33 positions shown; findings below may reference images not displayed]

FINDINGS: CT THORACIC SPINE FINDINGS

Alignment: Thoracic curvature convex to the right with the apex at
T6-7.

Vertebrae: ACDF in the cervical region extending down to C7.
Thoracic fusion from T3 through the thoracic region. Harrington rods
with most superior laminar hooks at the para spinous region T4 on
the right and inferior lamina T4 on the left. No evidence of
hardware failure. Despite the atypical location of the hook on the
right, this appears to be surrounded by fusion bone and does not
show evidence of motion and is probably properly functional.

Paraspinal and other soft tissues: No significant paravertebral
finding.

Disc levels: T1-2: Normal.

T2-3: Bilateral facet osteoarthritis. Bilateral foraminal
encroachment by osteophytes could affect either T2 nerve.

T3-4: Solid bone fusion posteriorly. Sufficient patency of the canal
and foramina.

T4 through T12: Solid fusion. No hardware complication in that
region. No apparent stenosis of the canal or foramina.

CT LUMBAR SPINE FINDINGS

Segmentation: 5 lumbar type vertebral bodies.

Alignment: Minimal fixed curvature in the thoracolumbar junction
region convex to the left and the lower lumbar region convex to the
right.

Vertebrae: Solid hacked in rod fusion as far as L2. Pedicle screws
and posterior rods from L1 through S2, with sacroiliac extensions.
No sign of nonunion or ongoing motion.

Paraspinal and other soft tissues: No significant paravertebral
finding.

Disc levels: Sufficient patency of the canal and foramina throughout
the region. As noted above, no evidence of hardware malposition or
motion.
IMPRESSION: 1. Spinal fusion from T3 through S2, with sacroiliac extensions. No
evidence of hardware failure or ongoing motion. Sufficient patency
of the canal and foramina throughout the region.
2. Bilateral facet osteoarthritis at T2-3 with bilateral foraminal
encroachment by osteophytes that could affect either T2 nerve.

## 2020-01-19 IMAGING — CT CT L SPINE W/O CM
3 of 6 series · 12 of 33 positions shown, 14 images · non-contrast
Comparison: Radiography [DATE].  CT [DATE].

CLINICAL DATA: Scoliosis with Harrington rod treatment. Shoulder
pain. Arm pain. Hip and leg pain.

EXAM:
CT THORACIC AND LUMBAR SPINE WITHOUT CONTRAST
TECHNIQUE: Multidetector CT imaging of the thoracic and lumbar spine was
performed without contrast. Multiplanar CT image reconstructions
were also generated.

[Series 6: t-spine/lspine 2.00 br40 s3 · axial · 0.36mm/px · z∈[-1337,-970]mm · 5 of 276 slices shown, 7 images]
[im 46/276  soft-tissue]
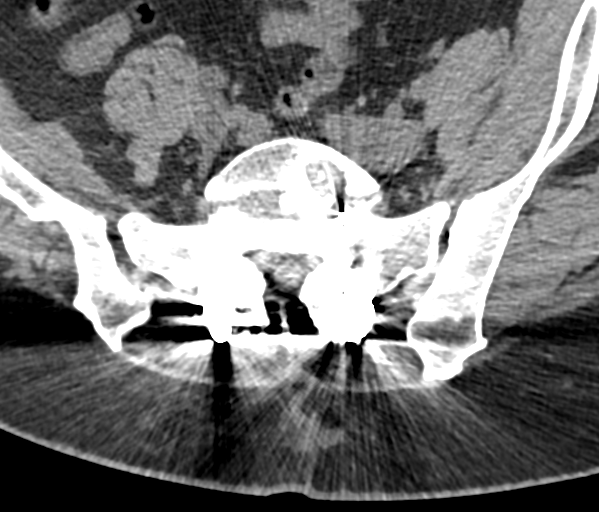
[im 46/276  bone]
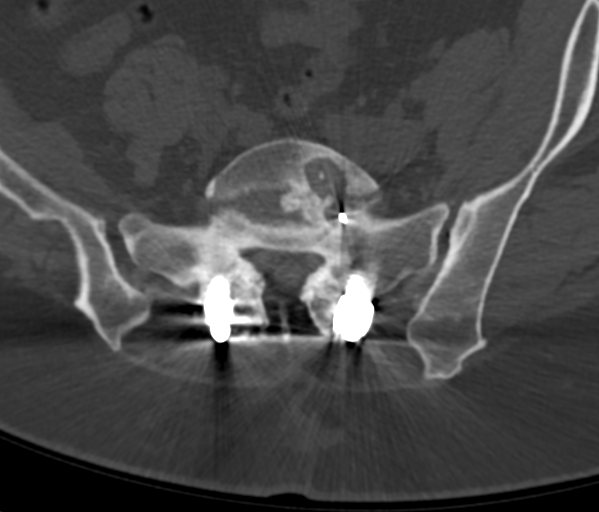
[im 92/276  bone]
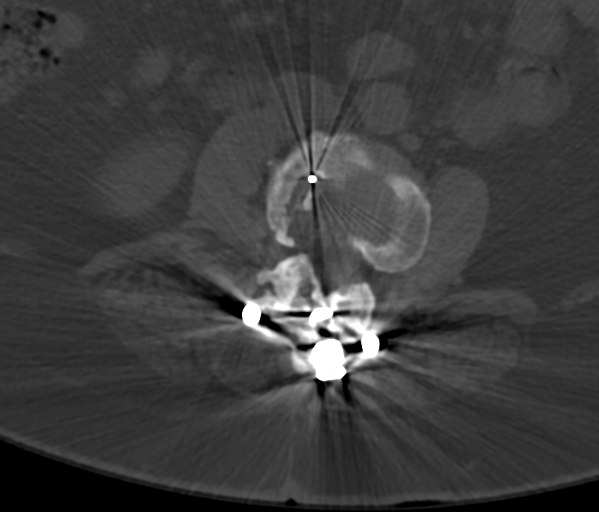
[im 138/276  bone]
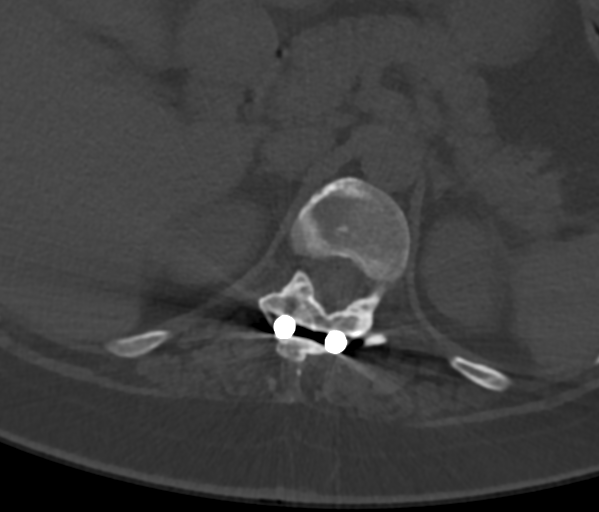
[im 184/276  bone]
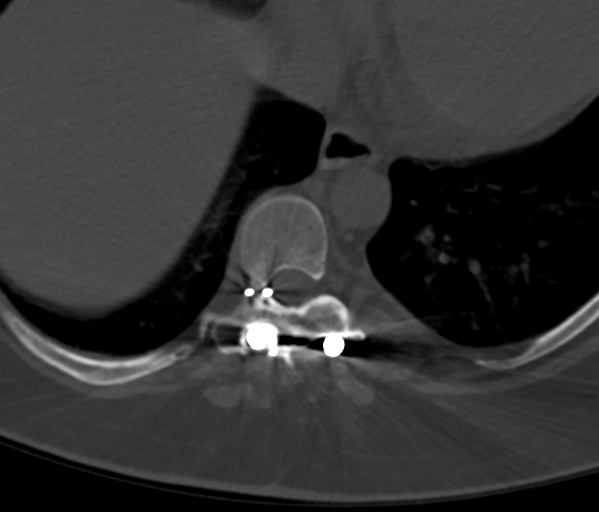
[im 230/276  soft-tissue]
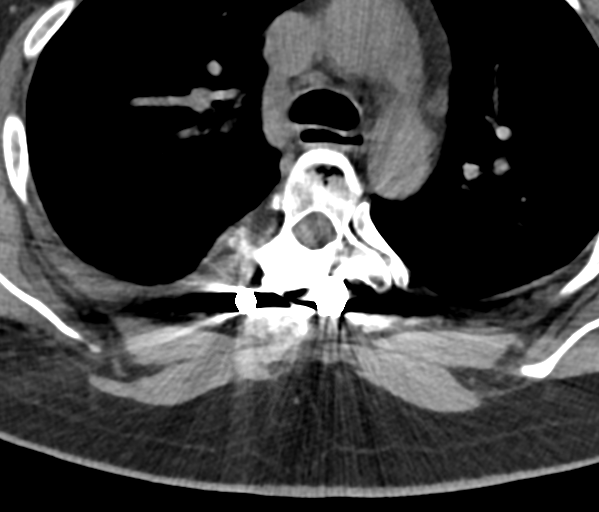
[im 230/276  bone]
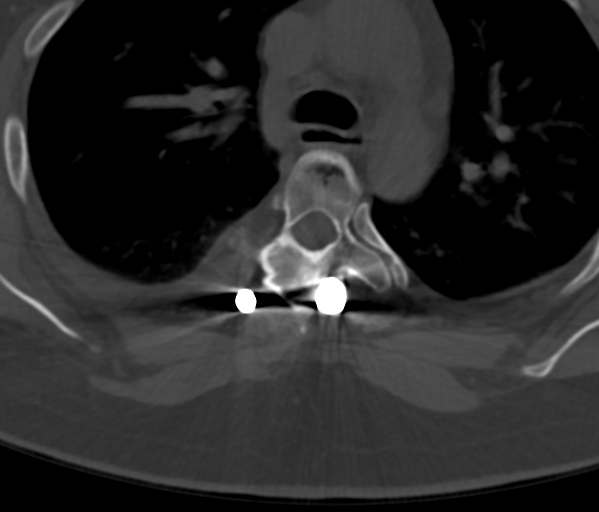

[Series 8: t-spine/lspine 2.00 br60 s3 · axial · 0.36mm/px · z∈[-1337,-970]mm · 5 of 276 slices shown]
[im 46/276  bone]
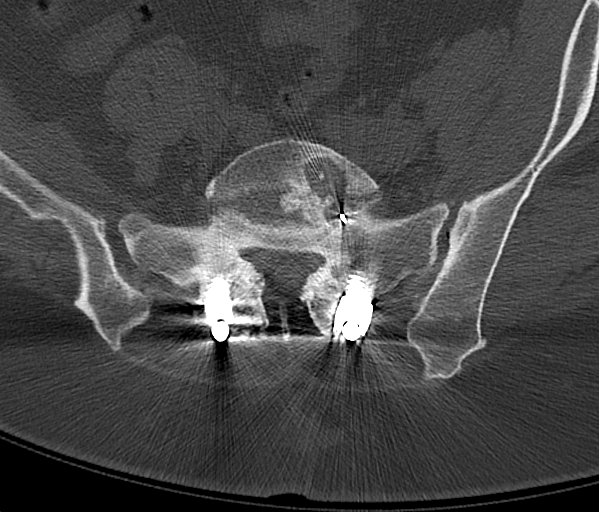
[im 92/276  bone]
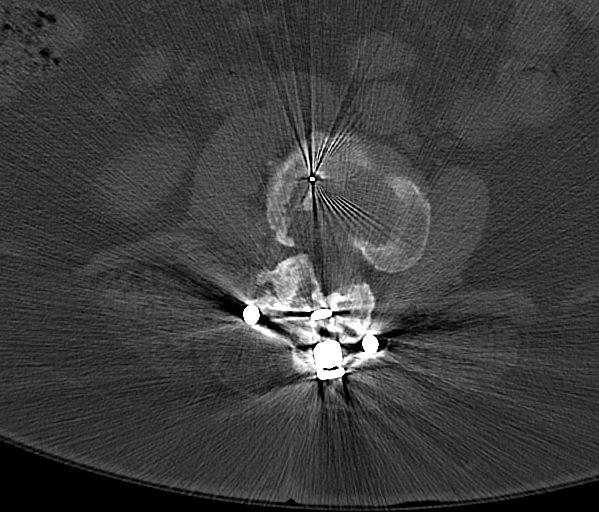
[im 138/276  bone]
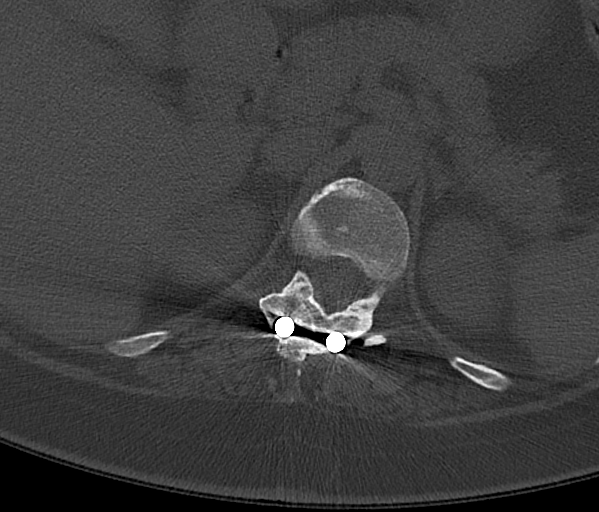
[im 184/276  bone]
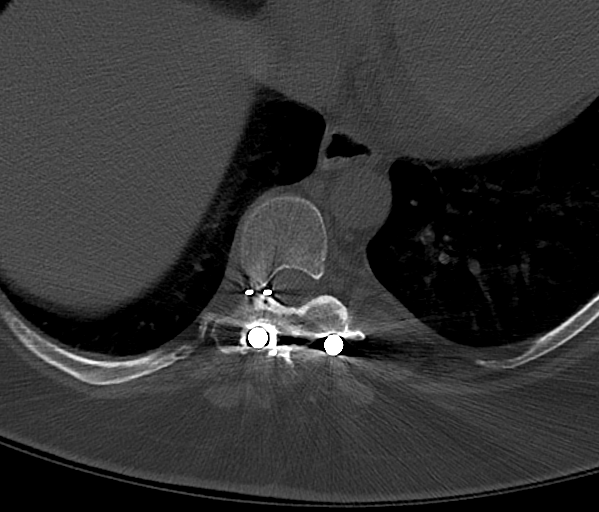
[im 230/276  bone]
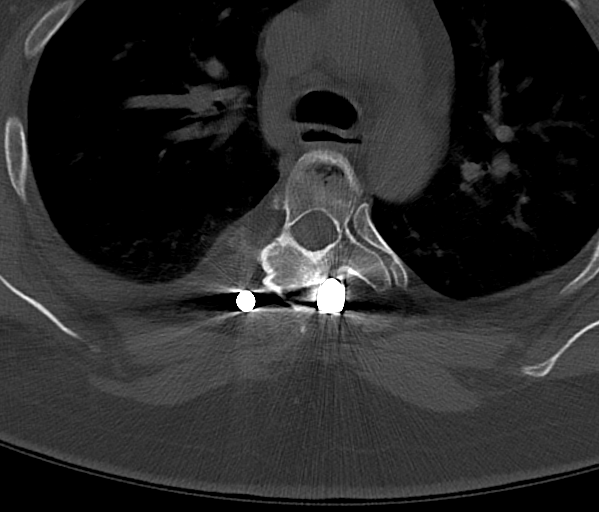

[Series 1004: t-spine/lspine 2.00 br60 s3_new · coronal · 0.46mm/px · 2 of 598 slices shown]
[im 200/598  bone]
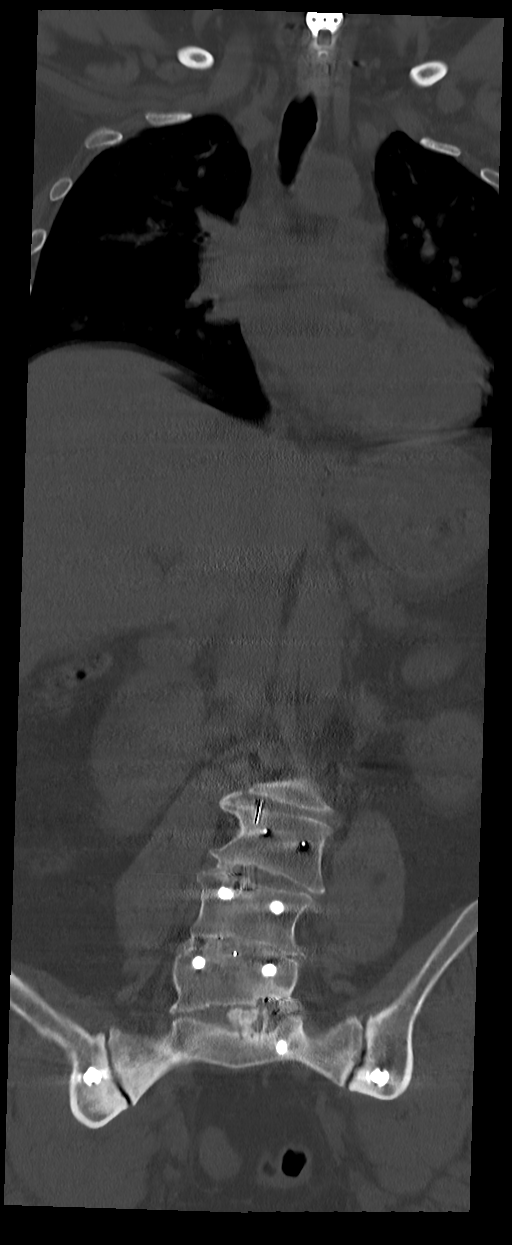
[im 399/598  bone]
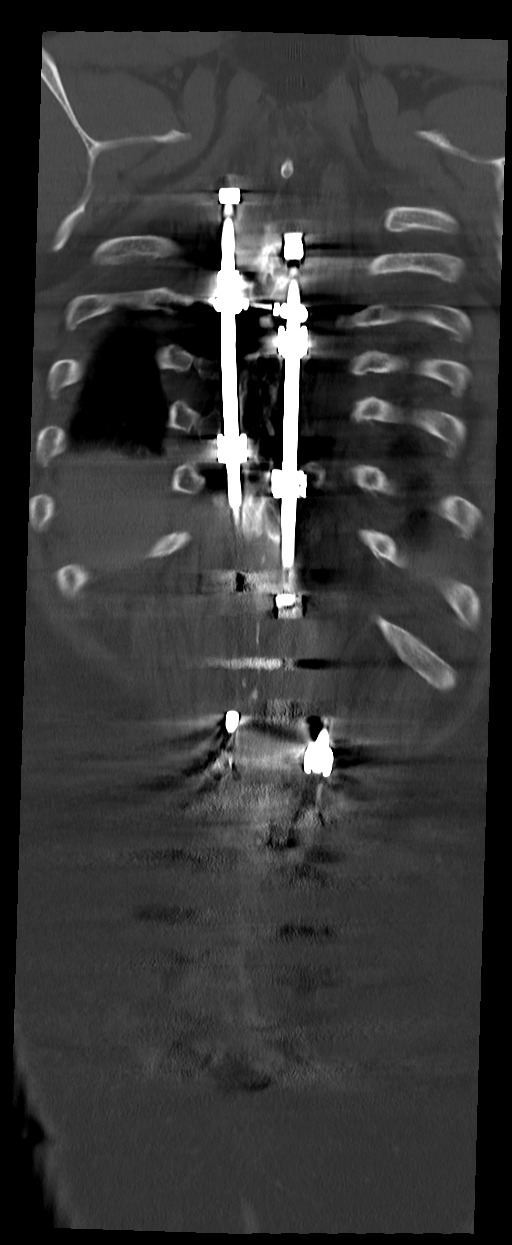

[12 of 33 positions shown; findings below may reference images not displayed]

FINDINGS: CT THORACIC SPINE FINDINGS

Alignment: Thoracic curvature convex to the right with the apex at
T6-7.

Vertebrae: ACDF in the cervical region extending down to C7.
Thoracic fusion from T3 through the thoracic region. Harrington rods
with most superior laminar hooks at the para spinous region T4 on
the right and inferior lamina T4 on the left. No evidence of
hardware failure. Despite the atypical location of the hook on the
right, this appears to be surrounded by fusion bone and does not
show evidence of motion and is probably properly functional.

Paraspinal and other soft tissues: No significant paravertebral
finding.

Disc levels: T1-2: Normal.

T2-3: Bilateral facet osteoarthritis. Bilateral foraminal
encroachment by osteophytes could affect either T2 nerve.

T3-4: Solid bone fusion posteriorly. Sufficient patency of the canal
and foramina.

T4 through T12: Solid fusion. No hardware complication in that
region. No apparent stenosis of the canal or foramina.

CT LUMBAR SPINE FINDINGS

Segmentation: 5 lumbar type vertebral bodies.

Alignment: Minimal fixed curvature in the thoracolumbar junction
region convex to the left and the lower lumbar region convex to the
right.

Vertebrae: Solid hacked in rod fusion as far as L2. Pedicle screws
and posterior rods from L1 through S2, with sacroiliac extensions.
No sign of nonunion or ongoing motion.

Paraspinal and other soft tissues: No significant paravertebral
finding.

Disc levels: Sufficient patency of the canal and foramina throughout
the region. As noted above, no evidence of hardware malposition or
motion.
IMPRESSION: 1. Spinal fusion from T3 through S2, with sacroiliac extensions. No
evidence of hardware failure or ongoing motion. Sufficient patency
of the canal and foramina throughout the region.
2. Bilateral facet osteoarthritis at T2-3 with bilateral foraminal
encroachment by osteophytes that could affect either T2 nerve.

## 2020-01-19 NOTE — Progress Notes (Signed)
Office Visit Note   Patient: Katie Ramirez           Date of Birth: [birth year]           MRN: [id number] Visit Date: 01/19/2020 Requested by: Sandre Kitty, MD [address] [place],  [place] [address] PCP: Sandre Kitty, MD  Subjective: Chief Complaint  Patient presents with  . Right Shoulder - Pain    Has been hurting for years. Neck surgery did not help. Did have trigger point injections in the posterior shoulder region here, in January. Did help for a brief period of time. Pain posterior shoulder to scapula.     HPI: 39yo F presenting to clinic for chronic right scapular/shoulder pain. States pain has been ongoing for three years, and has not significantly improved with trigger point injections, massage, or other conservative modalities. She has undergone three back surgeries, none of which offered relief, and is hoping to undergo her fourth soon. She says her arm continues to go 'cold and numb' when she is sleeping at night. She feels weak when doing certain exercises (esp stirring a pot or vacuuming). She says that, even though trigger point therapy was not significantly beneficial in the past, she would like to do this again today.               ROS:   All other systems were reviewed and are negative.  Objective: Vital Signs: There were no vitals taken for this visit.  Physical Exam:  General:  Alert and oriented, in no acute distress. Pulm:  Breathing unlabored. Psy:  Normal mood, congruent affect. Skin:  Right shoulder with no bruising, rashes, or erythema. Overlying skin intact.   Right Shoulder Exam:  Inspection: Symmetric muscle mass, no atrophy or deformity, no scars. Palpation: Significant tenderness to palpation over [facility], superior trap, and most markedly so within posterior rhomboids.  No tenderness to palpation over and around the acromion, over the [facility] joint or biceps insertion.  Range of motion: Full range of motion in forward flexion, abduction and  extension.    Rotator cuff testing:  Full strength and though mild pain with empty can (supraspinatus).  External rotation with full strength and no pain. Full strength and no pain with Napoleon and lift off.  Strength testing:  5 out of 5 strength with shoulder abduction (C5), wrist extension (C6), wrist flexion (C7), grip strength (C8), and finger abduction (T1).  Sensation: Intact to light touch throughout bilateral upper extremities.   Brisk distal capillary refill.   Imaging: No results found.  Assessment & Plan: 39yo F presenting to clinic with chronic right shoulder/scapular pain. Examination as above consistent with her previous trigger points.  -Given concern about arm going numb from time to time, will order EMG to evaluate for radicular source of symptoms. Esp in light of numerous spinal (and neck) surgeries.  -Trigger point injections performed as described below, patient tolerated very well and voiced immediate improvement in her symptoms - F/U with NSgy as previously scheduled with this provider - Patient had no further questions or concerns today.      Procedures: Right Rhomboid Triggerpoint Injection:  Risks and benefits of procedure discussed, Patient opted to proceed. Verbal Consent obtained.  Timeout performed.  Point of maximal tenderness within musculature identified and marked with needlecap. Overlying skin prepped with alcohol. Ethyl Chloride was used for topical analgesia.  Trigger point was injected with 5cc 20% dextrose solution in 1% Lidocaine without epinephrine using a 25G,  1.5in needle.   Patient tolerated the injection well with no immediate complications. Aftercare instructions were discussed, and patient was given strict return precautions.     PMFS History: Patient Active Problem List   Diagnosis Date Noted  . Loosening of hardware in spine (HCC) 04/16/2017  . Pseudarthrosis following spinal fusion 04/16/2017  . Chronic pain 08/05/2016    . Degenerative spondylolisthesis 06/12/2016  . Headache 06/30/2015  . Idiopathic scoliosis 02/08/2015  . Obesity 02/08/2015  . Lumbar pain 02/08/2015  . Depression 02/08/2015  . S/P lumbar fusion 09/15/2012   Past Medical History:  Diagnosis Date  . Chronic back pain   . Depression   . Scoliosis     Family History  Problem Relation Age of Onset  . Hypertension Father   . Diabetes Father   . Atrial fibrillation Father   . Down syndrome Sister   . ADD / ADHD Daughter   . Asthma Son   . ADD / ADHD Son     Past Surgical History:  Procedure Laterality Date  . ANTERIOR CERVICAL DECOMP/DISCECTOMY FUSION  12/02/2017  . BACK SURGERY     Harrington rods for scoliosis  . BTL    . CESAREAN SECTION    . ENDOMETRIAL ABLATION    . SPINE SURGERY  2013   fusion by Dr Danielle Dess, s Rod in childhood at [facility]   Social History   Occupational History  . Not on file  Tobacco Use  . Smoking status: Never Smoker  . Smokeless tobacco: Never Used  Substance and Sexual Activity  . Alcohol use: No  . Drug use: No  . Sexual activity: Never

## 2020-01-19 NOTE — Progress Notes (Signed)
I saw and examined the patient with Dr. Marga Hoots and agree with assessment and plan as outlined.    Chronic right arm and shoulder pain.  Last rhomboid trigger point injection helped temporarily.  Recent cervical and right shoulder MRI scans showed rotator cuff tendinopathy, no nerve compression.  Having arm paresthesias.  Will inject trigger point with dextrose.  Schedule nerve studies.  Proceed with spine surgery as planned after having nerve studies.

## 2020-02-02 ENCOUNTER — Other Ambulatory Visit: Payer: Self-pay | Admitting: Family Medicine

## 2020-03-13 ENCOUNTER — Encounter: Payer: Medicare Other | Admitting: Neurology

## 2020-03-27 ENCOUNTER — Encounter: Payer: Self-pay | Admitting: Neurology

## 2020-03-27 ENCOUNTER — Telehealth: Payer: Self-pay | Admitting: Neurology

## 2020-03-27 ENCOUNTER — Encounter: Payer: Medicare Other | Admitting: Neurology

## 2020-03-27 NOTE — Telephone Encounter (Signed)
This patient did not show for EMG and nerve conduction study evaluation today. 

## 2020-04-19 ENCOUNTER — Other Ambulatory Visit: Payer: Self-pay | Admitting: Family Medicine

## 2020-06-18 ENCOUNTER — Other Ambulatory Visit: Payer: Self-pay

## 2020-06-18 ENCOUNTER — Emergency Department
Admission: EM | Admit: 2020-06-18 | Discharge: 2020-06-18 | Disposition: A | Discharge status: Home / Self Care | Payer: Medicare Other | Attending: Emergency Medicine | Admitting: Emergency Medicine

## 2020-06-18 ENCOUNTER — Emergency Department: Payer: Medicare Other

## 2020-06-18 DIAGNOSIS — L03116 Cellulitis of left lower limb: Secondary | ICD-10-CM | POA: Insufficient documentation

## 2020-06-18 DIAGNOSIS — R600 Localized edema: Secondary | ICD-10-CM | POA: Insufficient documentation

## 2020-06-18 DIAGNOSIS — M7989 Other specified soft tissue disorders: Secondary | ICD-10-CM | POA: Diagnosis present

## 2020-06-18 LAB — COMPREHENSIVE METABOLIC PANEL
ALT: 32 U/L (ref 0–44)
AST: 29 U/L (ref 15–41)
Albumin: 3.6 g/dL (ref 3.5–5.0)
Alkaline Phosphatase: 99 U/L (ref 38–126)
Anion gap: 9 (ref 5–15)
BUN: 13 mg/dL (ref 6–20)
CO2: 22 mmol/L (ref 22–32)
Calcium: 8.9 mg/dL (ref 8.9–10.3)
Chloride: 107 mmol/L (ref 98–111)
Creatinine, Ser: 0.74 mg/dL (ref 0.44–1.00)
GFR, Estimated: >60 mL/min (ref >60)
Glucose, Bld: 104 mg/dL — ABNORMAL HIGH (ref 70–99)
Potassium: 4.3 mmol/L (ref 3.5–5.1)
Sodium: 138 mmol/L (ref 135–145)
Total Bilirubin: 0.4 mg/dL (ref 0.3–1.2)
Total Protein: 6.8 g/dL (ref 6.5–8.1)

## 2020-06-18 LAB — CBC WITH DIFFERENTIAL/PLATELET
Abs Immature Granulocytes: 0.02 10*3/uL (ref 0.00–0.07)
Basophils Absolute: 0 10*3/uL (ref 0.0–0.1)
Basophils Relative: 1 %
Eosinophils Absolute: 0.4 10*3/uL (ref 0.0–0.5)
Eosinophils Relative: 9 %
HCT: 30.4 % — ABNORMAL LOW (ref 36.0–46.0)
Hemoglobin: 9.5 g/dL — ABNORMAL LOW (ref 12.0–15.0)
Immature Granulocytes: 0 %
Lymphocytes Relative: 26 %
Lymphs Abs: 1.3 10*3/uL (ref 0.7–4.0)
MCH: 28.5 pg (ref 26.0–34.0)
MCHC: 31.3 g/dL (ref 30.0–36.0)
MCV: 91.3 fL (ref 80.0–100.0)
Monocytes Absolute: 0.5 10*3/uL (ref 0.1–1.0)
Monocytes Relative: 11 %
Neutro Abs: 2.6 10*3/uL (ref 1.7–7.7)
Neutrophils Relative %: 53 %
Platelets: 241 10*3/uL (ref 150–400)
RBC: 3.33 MIL/uL — ABNORMAL LOW (ref 3.87–5.11)
RDW: 13.3 % (ref 11.5–15.5)
Smear Review: NORMAL
WBC: 4.9 10*3/uL (ref 4.0–10.5)
nRBC: 0 % (ref 0.0–0.2)

## 2020-06-18 LAB — BRAIN NATRIURETIC PEPTIDE: B Natriuretic Peptide: 30.4 pg/mL (ref 0.0–100.0)

## 2020-06-18 LAB — URINALYSIS, COMPLETE (UACMP) WITH MICROSCOPIC
Bacteria, UA: NONE SEEN
Bilirubin Urine: NEGATIVE
Glucose, UA: NEGATIVE mg/dL
Hgb urine dipstick: NEGATIVE
Ketones, ur: NEGATIVE mg/dL
Leukocytes,Ua: NEGATIVE
Nitrite: NEGATIVE
Protein, ur: NEGATIVE mg/dL
Specific Gravity, Urine: 1.016 (ref 1.005–1.030)
pH: 5 (ref 5.0–8.0)

## 2020-06-18 IMAGING — US US EXTREM LOW VENOUS
1 series · 14 of 24 positions shown · non-contrast
Comparison: None.

CLINICAL DATA: Leg swelling.

EXAM:
BILATERAL LOWER EXTREMITY VENOUS DOPPLER ULTRASOUND
TECHNIQUE: Gray-scale sonography with compression, as well as color and duplex
ultrasound, were performed to evaluate the deep venous system(s)
from the level of the common femoral vein through the popliteal and
proximal calf veins.

[Series 1: us venous img lower bilat (dvt) · portal-venous · 14 of 67 slices shown]
[im 1/67]
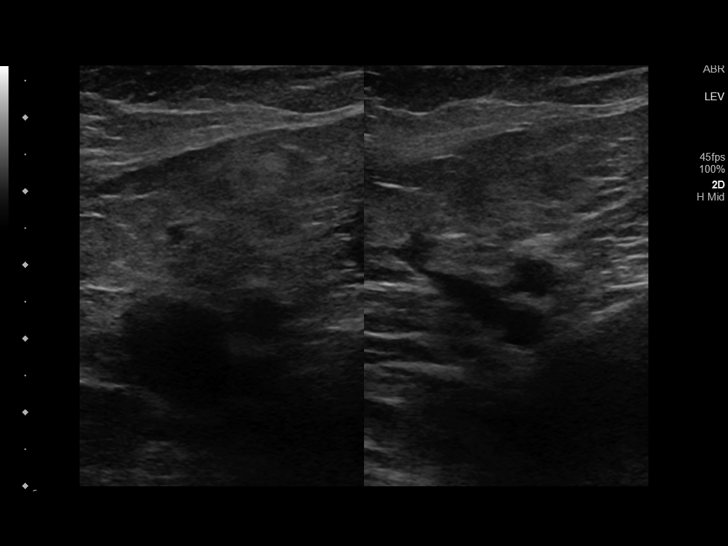
[im 6/67]
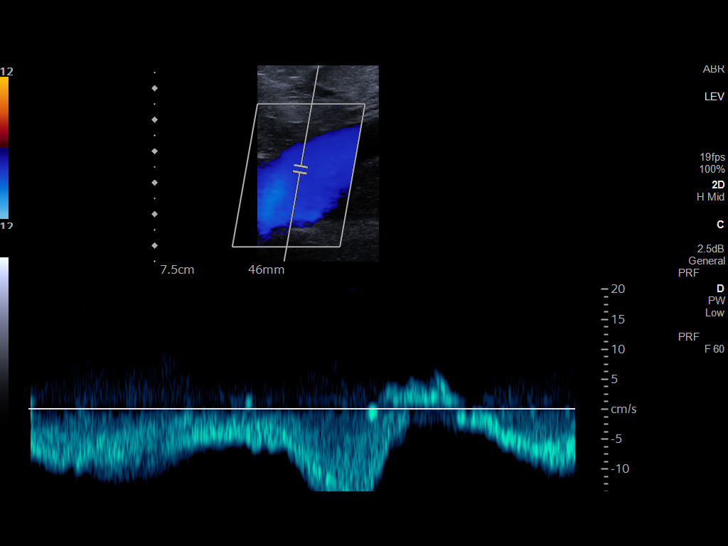
[im 12/67]
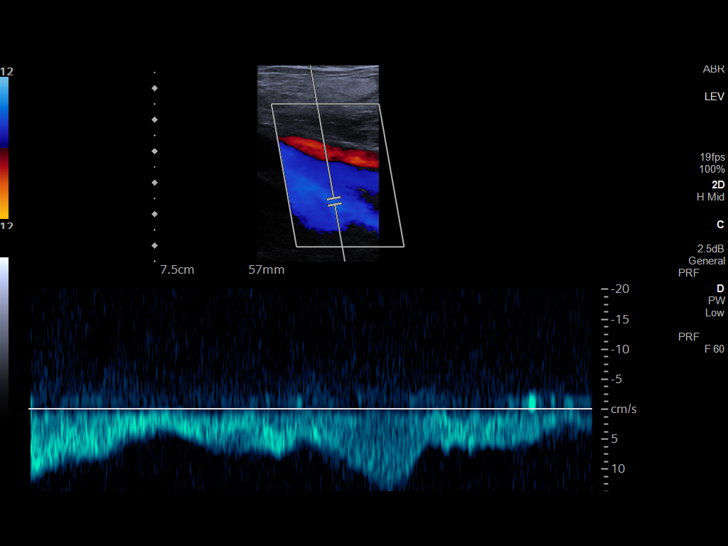
[im 18/67]
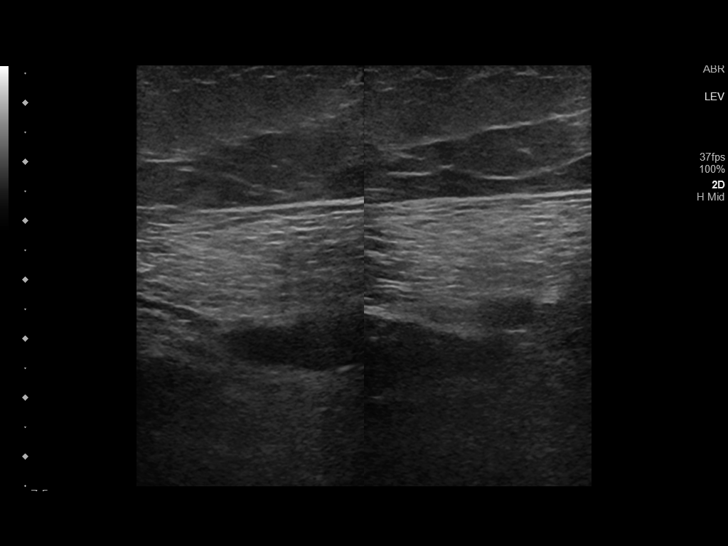
[im 21/67]
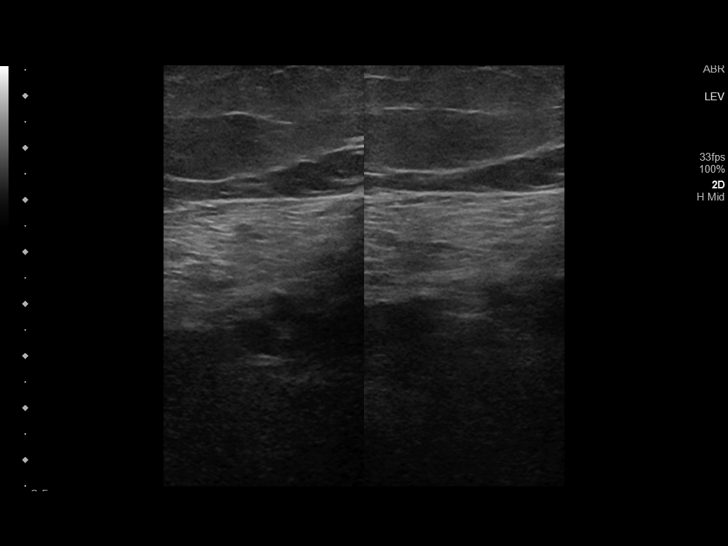
[im 26/67]
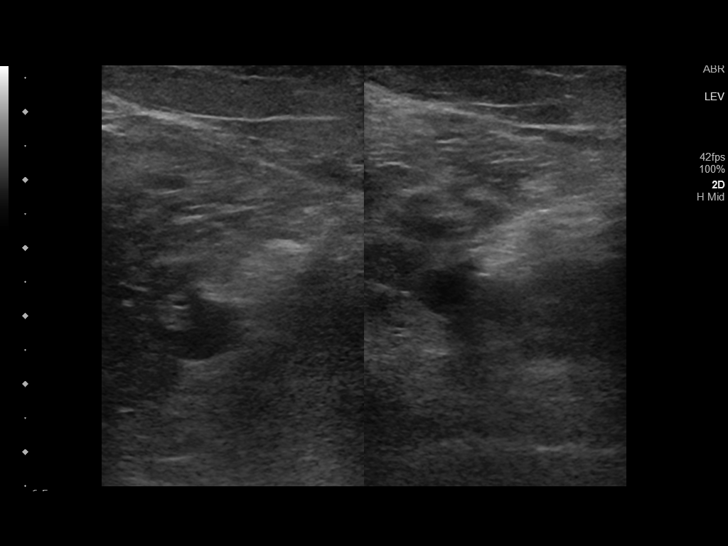
[im 32/67]
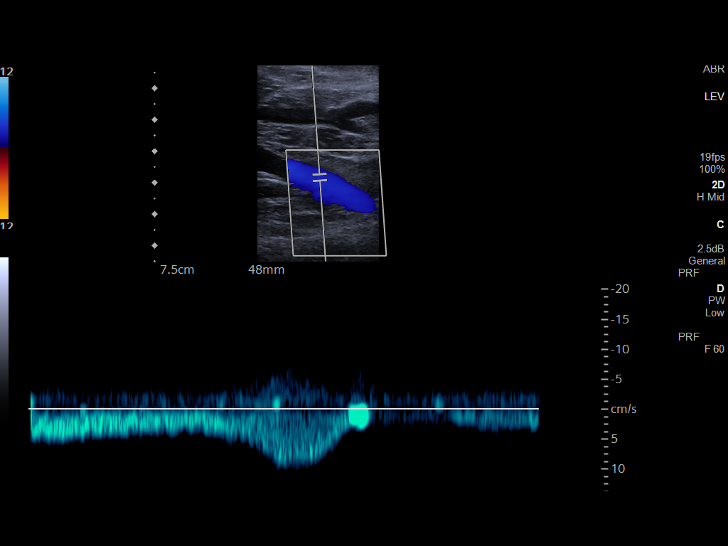
[im 35/67]
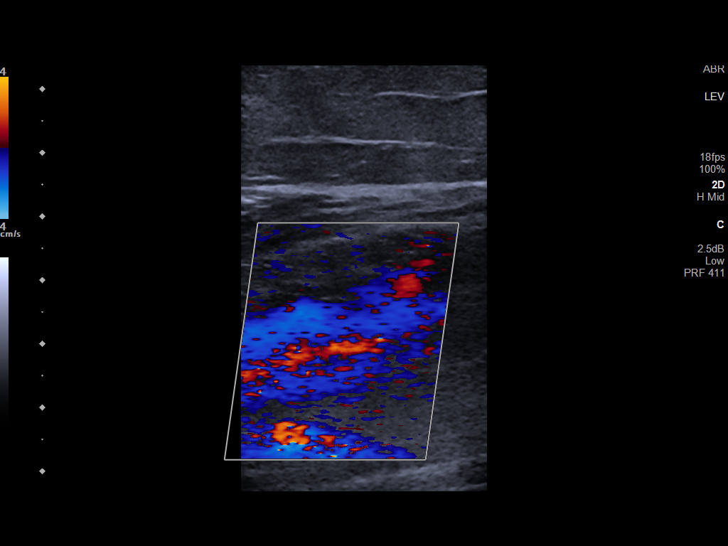
[im 41/67]
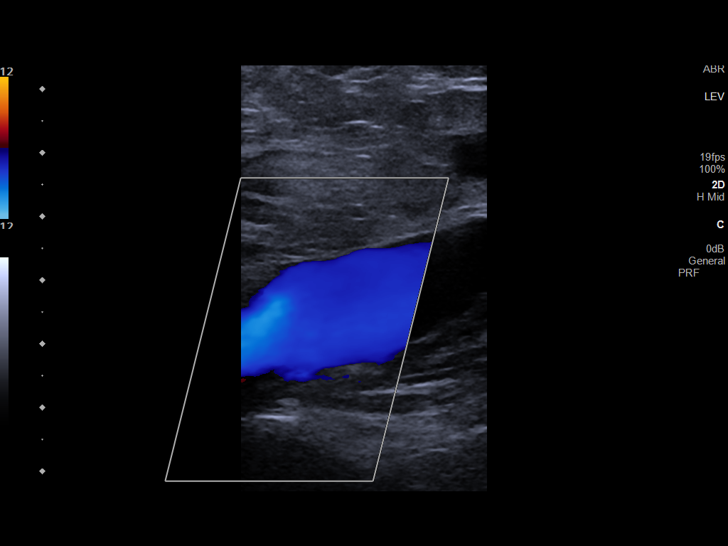
[im 46/67]
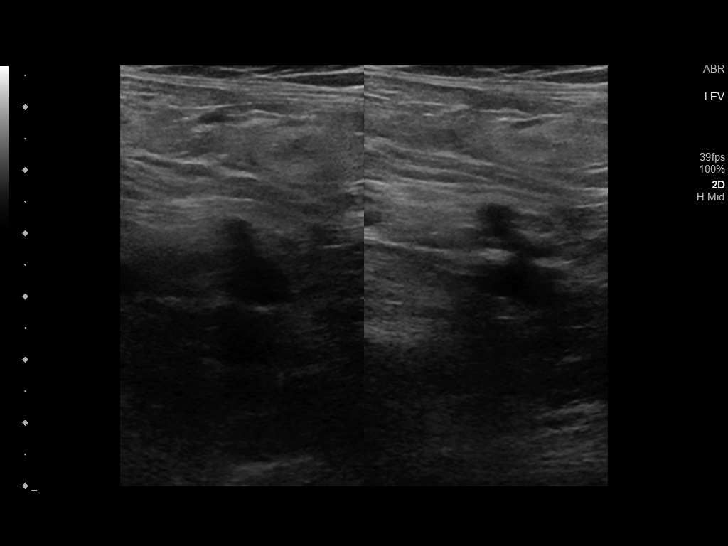
[im 52/67]
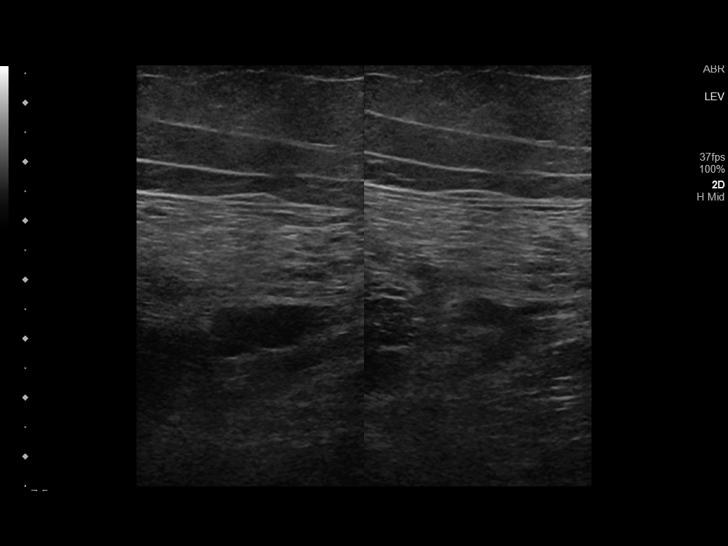
[im 55/67]
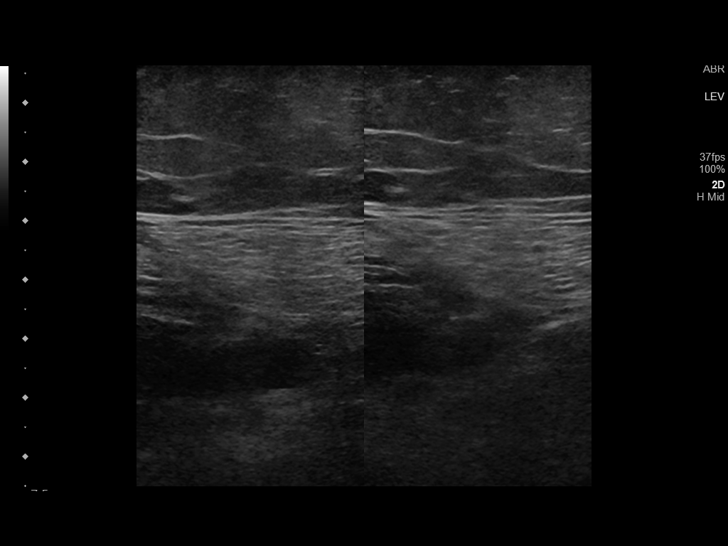
[im 61/67]
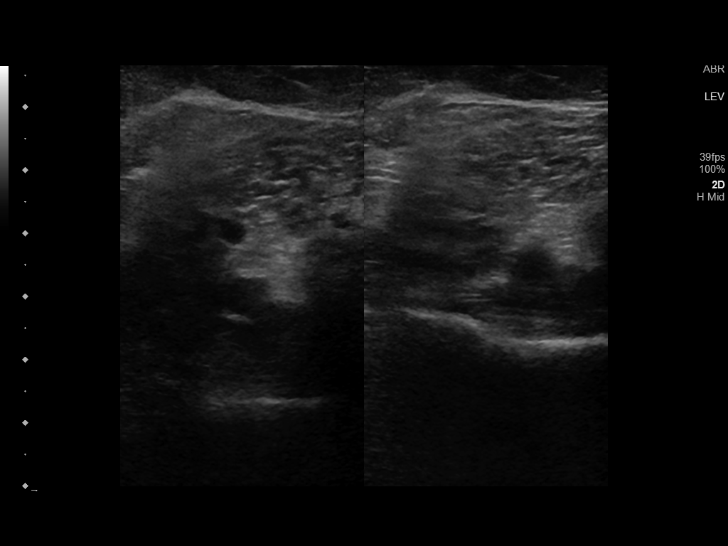
[im 67/67]
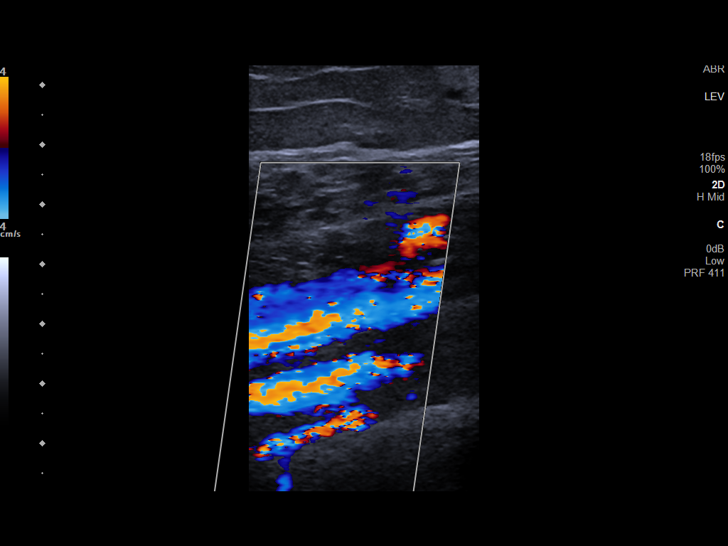

[14 of 24 positions shown; findings below may reference images not displayed]

FINDINGS: VENOUS

Normal compressibility of the common femoral, superficial femoral,
and popliteal veins, as well as the visualized calf veins.
Visualized portions of profunda femoral vein and great saphenous
vein unremarkable. No filling defects to suggest DVT on grayscale or
color Doppler imaging. Doppler waveforms show normal direction of
venous flow, normal respiratory plasticity and response to
augmentation.

Limited views of the contralateral common femoral vein are
unremarkable.

OTHER

None.

Limitations: none
IMPRESSION: Negative.

## 2020-06-18 MED ORDER — CEPHALEXIN 500 MG PO CAPS
500.0000 mg | ORAL_CAPSULE | Freq: Once | ORAL | Status: AC
  Administered 2020-06-18: 500 mg via ORAL
  Filled 2020-06-18: qty 1

## 2020-06-18 MED ORDER — CEPHALEXIN 500 MG PO CAPS: 500.0000 mg | ORAL_CAPSULE | Freq: Two times a day (BID) | ORAL | 0 refills | Status: AC

## 2020-06-18 NOTE — Discharge Instructions (Signed)
Follow up repeat US in one week if not getting better.  Antibiotics for cellulitis. Return for fevers or any other concerns.

## 2020-06-18 NOTE — ED Triage Notes (Signed)
Pt states she had major back surgery on March 30th and is now having swelling in bilateral lower extremities- pt states it started a week ago but has gotten worse the last couple days- pt states it is hard for her to walk

## 2020-06-18 NOTE — ED Provider Notes (Signed)
[facility] Emergency Department Provider Note  ____________________________________________   Event Date/Time   First MD Initiated Contact with Patient 06/18/20 1549     (approximate)  I have reviewed the triage vital signs and the nursing notes.   HISTORY  Chief Complaint Leg Swelling    HPI Katie Ramirez is a 40 y.o. female with history of back surgery on March 30 who comes in with bilateral lower extremity swelling.  Patient reports that she had been doing well from her back surgery.  She states that she had been able to ambulate without a walker.  However over the last 3 days she has noticed increasing swellings mostly in her left leg that is intermittent, worse after walking on it, better with rest and lifting her legs.  She does report some pain on that left leg.  She does report a little bit of swelling in the right leg but more notably in the left leg.  She denies ever having this previously, no history of blood clots.  Denies any shortness of breath or chest pain.          Past Medical History:  Diagnosis Date  . Chronic back pain   . Depression   . Scoliosis     Patient Active Problem List   Diagnosis Date Noted  . Loosening of hardware in spine (HCC) 04/16/2017  . Pseudarthrosis following spinal fusion 04/16/2017  . Chronic pain 08/05/2016  . Degenerative spondylolisthesis 06/12/2016  . Headache 06/30/2015  . Idiopathic scoliosis 02/08/2015  . Obesity 02/08/2015  . Lumbar pain 02/08/2015  . Depression 02/08/2015  . S/P lumbar fusion 09/15/2012    Past Surgical History:  Procedure Laterality Date  . ANTERIOR CERVICAL DECOMP/DISCECTOMY FUSION  12/02/2017  . BACK SURGERY     Harrington rods for scoliosis  . BTL    . CESAREAN SECTION    . ENDOMETRIAL ABLATION    . SPINE SURGERY  2013   fusion by Dr Danielle Dess, s Rod in childhood at [facility]    Prior to Admission medications   Medication Sig Start Date End Date Taking? Authorizing  Provider  celecoxib (CELEBREX) 200 MG capsule TAKE 1 CAPSULE BY MOUTH 2 (TWO) TIMES DAILY AS NEEDED. 04/19/20   Hilts, Casimiro Needle, MD  diclofenac Sodium (VOLTAREN) 1 % GEL APPLY 4 G TOPICALLY 4 (FOUR) TIMES DAILY AS NEEDED. 10/14/19   Hilts, Casimiro Needle, MD  gabapentin (NEURONTIN) 800 MG tablet Take 800 mg by mouth 3 (three) times daily. 03/13/19   [provider]  lidocaine (LIDODERM) 5 % 1 patch daily. 09/09/19   [provider]  oxyCODONE-acetaminophen (PERCOCET) 10-325 MG tablet Take 1 tablet by mouth every 4 (four) hours as needed for pain.    [provider]  phentermine (ADIPEX-P) 37.5 MG tablet Take 37.5 mg by mouth at bedtime. 08/25/19   [provider]  topiramate (TOPAMAX) 100 MG tablet Take 100 mg by mouth daily. 10/21/19   [provider]    Allergies Patient has no known allergies.  Family History  Problem Relation Age of Onset  . Hypertension Father   . Diabetes Father   . Atrial fibrillation Father   . Down syndrome Sister   . ADD / ADHD Daughter   . Asthma Son   . ADD / ADHD Son     Social History Social History   Tobacco Use  . Smoking status: Never Smoker  . Smokeless tobacco: Never Used  Substance Use Topics  . Alcohol use: No  .  Drug use: No      Review of Systems Constitutional: No fever/chills Eyes: No visual changes. ENT: No sore throat. Cardiovascular: Denies chest pain. Respiratory: Denies shortness of breath. Gastrointestinal: No abdominal pain.  No nausea, no vomiting.  No diarrhea.  No constipation. Genitourinary: Negative for dysuria. Musculoskeletal: Negative for back pain.  Bilateral leg swelling Skin: Negative for rash. Neurological: Negative for headaches, focal weakness or numbness. All other ROS negative ____________________________________________   PHYSICAL EXAM:  VITAL SIGNS: ED Triage Vitals  Enc Vitals Group     BP 06/18/20 1527 118/70     Pulse Rate 06/18/20 1527 93     Resp 06/18/20  1527 18     Temp 06/18/20 1527 98.6 F (37 C)     Temp Source 06/18/20 1527 Oral     SpO2 06/18/20 1527 96 %     Weight 06/18/20 1525 240 lb (108.9 kg)     Height 06/18/20 1525 5\' 3"  (1.6 m)     Head Circumference --      Peak Flow --      Pain Score 06/18/20 1525 7     Pain Loc --      Pain Edu? --      Excl. in GC? --     Constitutional: Alert and oriented. Well appearing and in no acute distress. Eyes: Conjunctivae are normal. EOMI. Head: Atraumatic. Nose: No congestion/rhinnorhea. Mouth/Throat: Mucous membranes are moist.   Neck: No stridor. Trachea Midline. FROM Cardiovascular: Normal rate, regular rhythm. Grossly normal heart sounds.  Good peripheral circulation. Respiratory: Normal respiratory effort.  No retractions. Lungs CTAB. Gastrointestinal: Soft and nontender. No distention. No abdominal bruits.  Musculoskeletal: Left leg is larger than the right leg.  Some erythema and warmth noted to the left leg with some edema 1+.  2+ distal pulses.  No redness or erythema over the joints Neurologic:  Normal speech and language. No gross focal neurologic deficits are appreciated.  Skin:  Skin is warm, dry and intact. No rash noted. Psychiatric: Mood and affect are normal. Speech and behavior are normal. GU: Deferred   ____________________________________________   LABS (all labs ordered are listed, but only abnormal results are displayed)  Labs Reviewed  COMPREHENSIVE METABOLIC PANEL - Abnormal; Notable for the following components:      Result Value   Glucose, Bld 104 (*)    All other components within normal limits  CBC WITH DIFFERENTIAL/PLATELET - Abnormal; Notable for the following components:   RBC 3.33 (*)    Hemoglobin 9.5 (*)    HCT 30.4 (*)    All other components within normal limits  CBC WITH DIFFERENTIAL/PLATELET  URINALYSIS, COMPLETE (UACMP) WITH MICROSCOPIC  BRAIN NATRIURETIC PEPTIDE    ____________________________________________   RADIOLOGY   Official radiology report(s): 06/20/20 Venous Img Lower Bilateral  Result Date: 06/18/2020 CLINICAL DATA:  Leg swelling. EXAM: BILATERAL LOWER EXTREMITY VENOUS DOPPLER ULTRASOUND TECHNIQUE: Gray-scale sonography with compression, as well as color and duplex ultrasound, were performed to evaluate the deep venous system(s) from the level of the common femoral vein through the popliteal and proximal calf veins. COMPARISON:  None. FINDINGS: VENOUS Normal compressibility of the common femoral, superficial femoral, and popliteal veins, as well as the visualized calf veins. Visualized portions of profunda femoral vein and great saphenous vein unremarkable. No filling defects to suggest DVT on grayscale or color Doppler imaging. Doppler waveforms show normal direction of venous flow, normal respiratory plasticity and response to augmentation. Limited views of the contralateral common femoral vein  are unremarkable. OTHER None. Limitations: none IMPRESSION: Negative. Electronically Signed   By: Gerome Sam III M.D   On: 06/18/2020 16:52    ____________________________________________   PROCEDURES  Procedure(s) performed (including Critical Care):  Procedures   ____________________________________________   INITIAL IMPRESSION / ASSESSMENT AND PLAN / ED COURSE  Aedyn Mckeon was evaluated in Emergency Department on 06/18/2020 for the symptoms described in the history of present illness. She was evaluated in the context of the global COVID-19 pandemic, which necessitated consideration that the patient might be at risk for infection with the SARS-CoV-2 virus that causes COVID-19. Institutional protocols and algorithms that pertain to the evaluation of patients at risk for COVID-19 are in a state of rapid change based on information released by regulatory bodies including the CDC and federal and state organizations. These policies and algorithms  were followed during the patient's care in the ED.    Patient comes in with bilateral leg swelling.  Will get labs to evaluate for nephrotic syndrome, liver dysfunction, heart failure, ultrasound evaluate for DVT. On exam does have what looks like cellulitis.   DVT ultrasounds are negative.  Work-up is otherwise reassuring.  Pt aware of anemia. No bleeding issues today   Discussed with patient I suspect that there could be a component of cellulitis.  Will treat with Keflex.  Given her first dose here.  Evidence of sepsis or bacteremia.  I discussed with patient that if her redness is getting worse or developing fevers she can return to the ER for repeat evaluation.  This was not going down after a week I recommend repeat ultrasound to make sure no DVT.  Patient expressed understanding felt comfortable with this plan.  She already has pain medication at home.         ____________________________________________   FINAL CLINICAL IMPRESSION(S) / ED DIAGNOSES   Final diagnoses:  Cellulitis of left lower extremity      MEDICATIONS GIVEN DURING THIS VISIT:  Medications  cephALEXin (KEFLEX) capsule 500 mg (500 mg Oral Given 06/18/20 1855)     ED Discharge Orders         Ordered    cephALEXin (KEFLEX) 500 MG capsule  2 times daily        06/18/20 1927           Note:  This document was prepared using Dragon voice recognition software and may include unintentional dictation errors.   Concha Se, MD 06/18/20 2016

## 2021-03-26 ENCOUNTER — Ambulatory Visit: Payer: Medicare Other | Admitting: Physician Assistant

## 2021-12-26 ENCOUNTER — Ambulatory Visit: Payer: Medicare Other

## 2021-12-26 ENCOUNTER — Ambulatory Visit: Payer: Medicare Other | Admitting: Podiatry

## 2021-12-26 DIAGNOSIS — M778 Other enthesopathies, not elsewhere classified: Secondary | ICD-10-CM

## 2021-12-26 DIAGNOSIS — M2170 Unequal limb length (acquired), unspecified site: Secondary | ICD-10-CM | POA: Diagnosis not present

## 2021-12-26 DIAGNOSIS — B353 Tinea pedis: Secondary | ICD-10-CM

## 2021-12-26 MED ORDER — TERBINAFINE HCL 250 MG PO TABS: 250.0000 mg | ORAL_TABLET | Freq: Every day | ORAL | 0 refills | Status: AC

## 2021-12-26 MED ORDER — CLOTRIMAZOLE-BETAMETHASONE 1-0.05 % EX CREA: 1.0000 | TOPICAL_CREAM | Freq: Two times a day (BID) | CUTANEOUS | 3 refills | Status: AC

## 2021-12-26 NOTE — Progress Notes (Signed)
   Chief Complaint  Patient presents with   Foot Problem    right foot dryness and skin peeling x3 years. left foot heel pain    Subjective: 41 y.o. female presenting today as a new patient for 2 separate complaints.  First the patient states that over the past 3 years or so her right foot has developed thick peeling skin.  She has tried different OTC topical medications with no improvement.  She does admit to going barefoot around the house Patient also has developed a limb length discrepancy because of severe scoliosis.  She has had multiple scoliosis surgeries with no improvement.  She would like to discuss ways to lift her left lower extremity with insoles or shoes  Past Medical History:  Diagnosis Date   Chronic back pain    Depression    Scoliosis     Past Surgical History:  Procedure Laterality Date   ANTERIOR CERVICAL DECOMP/DISCECTOMY FUSION  12/02/2017   BACK SURGERY     Harrington rods for scoliosis   BTL     CESAREAN SECTION     ENDOMETRIAL ABLATION     SPINE SURGERY  2013   fusion by Dr Ellene Route, s Rod in childhood at [facility]    No Known Allergies     Objective: Physical Exam General: The patient is alert and oriented x3 in no acute distress.  Dermatology: Hyperkeratotic, discolored, thickened, onychodystrophy noted to the nail plates of the right foot.  Diffuse hyperkeratosis of skin also noted to the right foot compared to the contralateral limb  Vascular: Palpable pedal pulses bilaterally. No edema or erythema noted. Capillary refill within normal limits.  Neurological: Epicritic and protective threshold grossly intact bilaterally.   Musculoskeletal Exam: No pedal deformity noted.  With weightbearing the patient leans to the left lower extremity  Assessment: #1 Onychomycosis of toenails right #2 tinea pedis right #3 unequal limb length. LT < RT secondary to scoliosis  Plan of Care:  #1 Patient was evaluated. #2  Today we discussed different treatment  options including oral, topical, and laser antifungal treatment modalities.  We discussed their efficacies and side effects.  Patient opts for oral antifungal treatment modality #3 prescription for Lamisil 250 mg #90 daily. Pt denies a history of liver pathology or symptoms.  Patient states that recently she had updated comprehensive metabolic panel WNL #4 prescription for Lotrisone cream apply 2 times daily  #5 felt heel lift pads were applied to the left lower extremity insoles to help elevate the foot.  2 pads were applied totaling 1/2" lift.  If this does help I do recommend custom external lift in the soles of the shoes.  Recommend trying [employer] for external lift in the soles of her shoes.  Too much heel lift and the insoles of the shoes will create problems with shoe wear. #6 return to clinic 4 weeks    Edrick Kins, DPM [facility]  Dr. Edrick Kins, DPM    [address], [place] [address]                Office [phone]  Fax [phone]

## 2022-02-04 ENCOUNTER — Ambulatory Visit: Payer: Medicare Other | Admitting: Podiatry

## 2022-02-04 ENCOUNTER — Encounter: Payer: Self-pay | Admitting: Podiatry

## 2022-02-04 VITALS — BP 131/72 | HR 94

## 2022-02-04 DIAGNOSIS — B353 Tinea pedis: Secondary | ICD-10-CM | POA: Diagnosis not present

## 2022-02-04 NOTE — Progress Notes (Signed)
   Chief Complaint  Patient presents with   Follow-up    Patient is here for follow-up left foot capsulitis.    Subjective: 41 y.o. female presenting today for follow-up evaluation of tinea pedis to the right foot which is improved significantly with the oral Lamisil and topical Lotrisone cream.  Patient also states that the felt pad heel lifts have helped somewhat with the gait secondary to the severe scoliosis.  Past Medical History:  Diagnosis Date   Chronic back pain    Depression    Scoliosis     Past Surgical History:  Procedure Laterality Date   ANTERIOR CERVICAL DECOMP/DISCECTOMY FUSION  12/02/2017   BACK SURGERY     Harrington rods for scoliosis   BTL     CESAREAN SECTION     ENDOMETRIAL ABLATION     SPINE SURGERY  2013   fusion by Dr Danielle Dess, s Rod in childhood at [facility]    No Known Allergies   12/26/2021  Objective: Physical Exam General: The patient is alert and oriented x3 in no acute distress.  Dermatology: Hyperkeratotic, discolored, thickened, onychodystrophy noted to the nail plates of the right foot.  Diffuse hyperkeratosis of skin also noted to the right foot compared to the contralateral limb  Vascular: Palpable pedal pulses bilaterally. No edema or erythema noted. Capillary refill within normal limits.  Neurological: Epicritic and protective threshold grossly intact bilaterally.   Musculoskeletal Exam: No pedal deformity noted.  With weightbearing the patient leans to the left lower extremity  Assessment: #1 Onychomycosis of toenails right #2 tinea pedis right #3 unequal limb length. LT < RT secondary to scoliosis  Plan of Care:  #1 Patient was evaluated. #2 continue Lamisil 250mg  #90 daily until completed #3  Continue Lotrisone cream 2 times daily as needed #4 continue heel lift to help with altered gait and limb length discrepancy secondary to scoliosis #5 return to clinic as needed  , DPM [facility]  Dr.  Felecia Shelling, DPM    [address] [place], [place]                Office [phone]  Fax [phone]

## 2022-12-04 ENCOUNTER — Ambulatory Visit (INDEPENDENT_AMBULATORY_CARE_PROVIDER_SITE_OTHER): Payer: Medicare Other

## 2022-12-04 ENCOUNTER — Encounter: Payer: Self-pay | Admitting: Podiatry

## 2022-12-04 ENCOUNTER — Ambulatory Visit: Payer: Medicare Other | Admitting: Podiatry

## 2022-12-04 VITALS — Ht 63.0 in | Wt 240.0 lb

## 2022-12-04 DIAGNOSIS — M778 Other enthesopathies, not elsewhere classified: Secondary | ICD-10-CM

## 2022-12-04 DIAGNOSIS — M722 Plantar fascial fibromatosis: Secondary | ICD-10-CM | POA: Diagnosis not present

## 2022-12-04 MED ORDER — DICLOFENAC SODIUM 75 MG PO TBEC: 75.0000 mg | DELAYED_RELEASE_TABLET | Freq: Two times a day (BID) | ORAL | 2 refills | Status: DC

## 2022-12-05 NOTE — Progress Notes (Signed)
Subjective:   Patient ID: Katie Ramirez, female   DOB: 42 y.o.   MRN: [id number]   HPI Patient presents with severe pain in the plantar aspect of the left heel stating it has been very sore and states that she cannot do a steroid currently as she is being looked at for possible gastric surgery   ROS      Objective:  Physical Exam  Neurovascular status intact inflammation pain of the plantar heel left quite intense at the insertional point tendon calcaneus     Assessment:  Acute plantar fasciitis left heel     Plan:  H&P reviewed at great length and at this point dispensed air fracture walker properly fitted to her lower leg to take all pressure off the leg as she cannot tolerate a injection currently.  She will be seen back 3 weeks she will try to get permission for this if the symptoms are still occurring  X-rays indicate spur no indication stress fracture arthritis

## 2022-12-25 ENCOUNTER — Ambulatory Visit: Payer: Medicare Other | Admitting: Podiatry

## 2023-02-26 ENCOUNTER — Other Ambulatory Visit: Payer: Self-pay | Admitting: Podiatry

## 2023-03-06 ENCOUNTER — Ambulatory Visit: Payer: Medicare Other | Admitting: Podiatry

## 2023-03-07 ENCOUNTER — Telehealth: Payer: Self-pay | Admitting: Podiatry

## 2023-03-07 ENCOUNTER — Ambulatory Visit: Payer: Medicaid Other | Admitting: Podiatry

## 2023-03-07 NOTE — Telephone Encounter (Signed)
 Pt left message yesterday 1/9 at 518pm to cxl her appt for 1/10 she is sick.  I called and left message for pt that I got her message and cxled her appt and to call to r/s

## 2023-04-14 ENCOUNTER — Ambulatory Visit: Payer: Medicare Other | Admitting: Podiatry

## 2023-04-28 ENCOUNTER — Encounter: Payer: Self-pay | Admitting: Podiatry

## 2023-04-28 ENCOUNTER — Ambulatory Visit (INDEPENDENT_AMBULATORY_CARE_PROVIDER_SITE_OTHER): Payer: Medicare Other | Admitting: Podiatry

## 2023-04-28 DIAGNOSIS — M722 Plantar fascial fibromatosis: Secondary | ICD-10-CM | POA: Diagnosis not present

## 2023-04-28 MED ORDER — TRIAMCINOLONE ACETONIDE 10 MG/ML IJ SUSP
10.0000 mg | Freq: Once | INTRAMUSCULAR | Status: AC
  Administered 2023-04-28: 10 mg via INTRA_ARTICULAR

## 2023-04-30 NOTE — Progress Notes (Signed)
 Subjective:   Patient ID: Katie Ramirez, female   DOB: 43 y.o.   MRN: [id number]   HPI Patient presents stating she is having a lot of pain in her left heel and it has really been bothering her and is worse when she gets up in the morning and after periods of sitting   ROS      Objective:  Physical Exam  Neuro vascular status intact with acute discomfort medial fascial band left at the insertional point tendon calcaneus fluid buildup with inability to stretch the foot probably     Assessment:  Acute plantar fasciitis left     Plan:  H&P reviewed I did go ahead today did sterile prep and injected the plantar fascia at insertion 3 mg Kenalog 5 mg Xylocaine.  Went ahead and dispensed night splint with all instructions on usage and patient will be seen back for reevaluation with this being properly fitted to the lower leg and being a static device over-the-counter

## 2023-05-12 ENCOUNTER — Ambulatory Visit: Admitting: Podiatry

## 2023-07-29 ENCOUNTER — Other Ambulatory Visit: Payer: Self-pay | Admitting: Internal Medicine

## 2023-07-29 DIAGNOSIS — Z1231 Encounter for screening mammogram for malignant neoplasm of breast: Secondary | ICD-10-CM

## 2023-08-21 ENCOUNTER — Ambulatory Visit
Admission: RE | Admit: 2023-08-21 | Discharge: 2023-08-21 | Disposition: A | Discharge status: Home / Self Care | Source: Ambulatory Visit | Attending: Internal Medicine | Admitting: Internal Medicine

## 2023-08-21 DIAGNOSIS — Z1231 Encounter for screening mammogram for malignant neoplasm of breast: Secondary | ICD-10-CM

## 2023-09-02 ENCOUNTER — Other Ambulatory Visit: Payer: Self-pay | Admitting: Internal Medicine

## 2023-09-02 DIAGNOSIS — N6320 Unspecified lump in the left breast, unspecified quadrant: Secondary | ICD-10-CM

## 2023-09-17 ENCOUNTER — Ambulatory Visit
Admission: RE | Admit: 2023-09-17 | Discharge: 2023-09-17 | Disposition: A | Discharge status: Home / Self Care | Source: Ambulatory Visit | Attending: Internal Medicine | Admitting: Internal Medicine

## 2023-09-17 DIAGNOSIS — N6320 Unspecified lump in the left breast, unspecified quadrant: Secondary | ICD-10-CM
